# Patient Record
Sex: Male | Born: 1945 | Race: White | Hispanic: No | Smoking: Former smoker
Health system: Southern US, Community
[De-identification: ages and names within clinical notes are randomized; demographics above are authoritative.]

## PROBLEM LIST (undated history)

## (undated) DIAGNOSIS — I739 Peripheral vascular disease, unspecified: Secondary | ICD-10-CM

## (undated) DIAGNOSIS — J42 Unspecified chronic bronchitis: Secondary | ICD-10-CM

## (undated) DIAGNOSIS — I10 Essential (primary) hypertension: Secondary | ICD-10-CM

## (undated) DIAGNOSIS — C801 Malignant (primary) neoplasm, unspecified: Secondary | ICD-10-CM

## (undated) DIAGNOSIS — I240 Acute coronary thrombosis not resulting in myocardial infarction: Secondary | ICD-10-CM

## (undated) HISTORY — PX: MANDIBLE SURGERY: SHX707

## (undated) HISTORY — PX: NECK SURGERY: SHX720

## (undated) HISTORY — PX: KNEE SURGERY: SHX244

---

## 1999-05-11 ENCOUNTER — Encounter: Admission: RE | Admit: 1999-05-11 | Discharge: 1999-08-09 | Payer: Self-pay | Admitting: Anesthesiology

## 2000-01-14 ENCOUNTER — Ambulatory Visit (HOSPITAL_COMMUNITY): Admission: RE | Admit: 2000-01-14 | Discharge: 2000-01-14 | Payer: Self-pay | Admitting: Orthopedic Surgery

## 2000-01-14 ENCOUNTER — Encounter: Payer: Self-pay | Admitting: Orthopedic Surgery

## 2000-01-28 ENCOUNTER — Encounter: Payer: Self-pay | Admitting: Orthopedic Surgery

## 2000-01-28 ENCOUNTER — Ambulatory Visit (HOSPITAL_COMMUNITY): Admission: RE | Admit: 2000-01-28 | Discharge: 2000-01-28 | Payer: Self-pay | Admitting: Orthopedic Surgery

## 2000-02-11 ENCOUNTER — Encounter: Payer: Self-pay | Admitting: Orthopedic Surgery

## 2000-02-11 ENCOUNTER — Ambulatory Visit (HOSPITAL_COMMUNITY): Admission: RE | Admit: 2000-02-11 | Discharge: 2000-02-11 | Payer: Self-pay | Admitting: Orthopedic Surgery

## 2001-09-30 HISTORY — PX: CERVICAL SPINE SURGERY: SHX589

## 2002-01-22 ENCOUNTER — Emergency Department (HOSPITAL_COMMUNITY): Admission: EM | Admit: 2002-01-22 | Discharge: 2002-01-22 | Payer: Self-pay | Admitting: *Deleted

## 2002-01-24 ENCOUNTER — Emergency Department (HOSPITAL_COMMUNITY): Admission: EM | Admit: 2002-01-24 | Discharge: 2002-01-25 | Payer: Self-pay | Admitting: *Deleted

## 2002-02-04 ENCOUNTER — Encounter: Payer: Self-pay | Admitting: Family Medicine

## 2002-02-04 ENCOUNTER — Encounter: Admission: RE | Admit: 2002-02-04 | Discharge: 2002-02-04 | Payer: Self-pay | Admitting: Family Medicine

## 2002-02-11 ENCOUNTER — Encounter: Admission: RE | Admit: 2002-02-11 | Discharge: 2002-05-12 | Payer: Self-pay | Admitting: Family Medicine

## 2002-06-09 ENCOUNTER — Observation Stay (HOSPITAL_COMMUNITY): Admission: RE | Admit: 2002-06-09 | Discharge: 2002-06-10 | Payer: Self-pay | Admitting: Neurosurgery

## 2005-02-15 ENCOUNTER — Emergency Department (HOSPITAL_COMMUNITY): Admission: EM | Admit: 2005-02-15 | Discharge: 2005-02-15 | Payer: Self-pay | Admitting: Emergency Medicine

## 2005-02-19 ENCOUNTER — Ambulatory Visit (HOSPITAL_COMMUNITY): Admission: RE | Admit: 2005-02-19 | Discharge: 2005-02-19 | Payer: Self-pay | Admitting: Orthopedic Surgery

## 2005-02-19 ENCOUNTER — Ambulatory Visit (HOSPITAL_BASED_OUTPATIENT_CLINIC_OR_DEPARTMENT_OTHER): Admission: RE | Admit: 2005-02-19 | Discharge: 2005-02-19 | Payer: Self-pay | Admitting: Orthopedic Surgery

## 2005-02-26 ENCOUNTER — Ambulatory Visit (HOSPITAL_COMMUNITY): Admission: RE | Admit: 2005-02-26 | Discharge: 2005-02-26 | Payer: Self-pay | Admitting: Orthopedic Surgery

## 2005-02-26 ENCOUNTER — Ambulatory Visit (HOSPITAL_BASED_OUTPATIENT_CLINIC_OR_DEPARTMENT_OTHER): Admission: RE | Admit: 2005-02-26 | Discharge: 2005-02-26 | Payer: Self-pay | Admitting: Orthopedic Surgery

## 2005-05-07 ENCOUNTER — Emergency Department (HOSPITAL_COMMUNITY): Admission: EM | Admit: 2005-05-07 | Discharge: 2005-05-08 | Payer: Self-pay | Admitting: *Deleted

## 2006-08-22 ENCOUNTER — Encounter (INDEPENDENT_AMBULATORY_CARE_PROVIDER_SITE_OTHER): Payer: Self-pay | Admitting: Specialist

## 2006-08-22 ENCOUNTER — Ambulatory Visit (HOSPITAL_COMMUNITY): Admission: RE | Admit: 2006-08-22 | Discharge: 2006-08-22 | Payer: Self-pay | Admitting: Surgery

## 2007-12-09 ENCOUNTER — Ambulatory Visit: Payer: Self-pay | Admitting: Cardiology

## 2007-12-15 ENCOUNTER — Ambulatory Visit: Payer: Self-pay

## 2008-05-05 ENCOUNTER — Other Ambulatory Visit: Admission: RE | Admit: 2008-05-05 | Discharge: 2008-05-05 | Payer: Self-pay | Admitting: Otolaryngology

## 2008-05-09 ENCOUNTER — Encounter: Admission: RE | Admit: 2008-05-09 | Discharge: 2008-05-09 | Payer: Self-pay | Admitting: Otolaryngology

## 2008-07-07 ENCOUNTER — Ambulatory Visit: Payer: Self-pay | Admitting: Internal Medicine

## 2008-07-18 ENCOUNTER — Ambulatory Visit (HOSPITAL_COMMUNITY): Admission: RE | Admit: 2008-07-18 | Discharge: 2008-07-18 | Payer: Self-pay | Admitting: Internal Medicine

## 2008-07-21 ENCOUNTER — Ambulatory Visit: Admission: RE | Admit: 2008-07-21 | Discharge: 2008-07-21 | Payer: Self-pay | Admitting: Internal Medicine

## 2008-07-26 LAB — CBC WITH DIFFERENTIAL/PLATELET
BASO%: 0.3 % (ref 0.0–2.0)
Basophils Absolute: 0 10*3/uL (ref 0.0–0.1)
EOS%: 4 % (ref 0.0–7.0)
Eosinophils Absolute: 0.2 10*3/uL (ref 0.0–0.5)
HCT: 38.8 % (ref 38.7–49.9)
HGB: 13.3 g/dL (ref 13.0–17.1)
LYMPH%: 20.6 % (ref 14.0–48.0)
MCH: 27.3 pg — ABNORMAL LOW (ref 28.0–33.4)
MCHC: 34.2 g/dL (ref 32.0–35.9)
MCV: 79.8 fL — ABNORMAL LOW (ref 81.6–98.0)
MONO#: 0.4 10*3/uL (ref 0.1–0.9)
MONO%: 9.4 % (ref 0.0–13.0)
NEUT#: 2.5 10*3/uL (ref 1.5–6.5)
NEUT%: 65.7 % (ref 40.0–75.0)
Platelets: 159 10*3/uL (ref 145–400)
RBC: 4.86 10*6/uL (ref 4.20–5.71)
RDW: 12.9 % (ref 11.2–14.6)
WBC: 3.9 10*3/uL — ABNORMAL LOW (ref 4.0–10.0)
lymph#: 0.8 10*3/uL — ABNORMAL LOW (ref 0.9–3.3)

## 2008-07-26 LAB — COMPREHENSIVE METABOLIC PANEL
ALT: 14 U/L (ref 0–53)
AST: 13 U/L (ref 0–37)
Albumin: 4.5 g/dL (ref 3.5–5.2)
Alkaline Phosphatase: 104 U/L (ref 39–117)
BUN: 12 mg/dL (ref 6–23)
CO2: 29 mEq/L (ref 19–32)
Calcium: 9.5 mg/dL (ref 8.4–10.5)
Chloride: 96 mEq/L (ref 96–112)
Creatinine, Ser: 0.92 mg/dL (ref 0.40–1.50)
Glucose, Bld: 450 mg/dL — ABNORMAL HIGH (ref 70–99)
Potassium: 4.7 mEq/L (ref 3.5–5.3)
Sodium: 133 mEq/L — ABNORMAL LOW (ref 135–145)
Total Bilirubin: 0.5 mg/dL (ref 0.3–1.2)
Total Protein: 6.3 g/dL (ref 6.0–8.3)

## 2008-07-26 LAB — LACTATE DEHYDROGENASE: LDH: 123 U/L (ref 94–250)

## 2008-07-28 ENCOUNTER — Ambulatory Visit: Admission: RE | Admit: 2008-07-28 | Discharge: 2008-07-28 | Payer: Self-pay | Admitting: Internal Medicine

## 2008-08-02 LAB — COMPREHENSIVE METABOLIC PANEL
ALT: 13 U/L (ref 0–53)
AST: 17 U/L (ref 0–37)
Albumin: 4.3 g/dL (ref 3.5–5.2)
Alkaline Phosphatase: 96 U/L (ref 39–117)
BUN: 12 mg/dL (ref 6–23)
CO2: 25 mEq/L (ref 19–32)
Calcium: 9.6 mg/dL (ref 8.4–10.5)
Chloride: 103 mEq/L (ref 96–112)
Creatinine, Ser: 0.8 mg/dL (ref 0.40–1.50)
Glucose, Bld: 188 mg/dL — ABNORMAL HIGH (ref 70–99)
Potassium: 4.2 mEq/L (ref 3.5–5.3)
Sodium: 141 mEq/L (ref 135–145)
Total Bilirubin: 0.4 mg/dL (ref 0.3–1.2)
Total Protein: 6.1 g/dL (ref 6.0–8.3)

## 2008-08-02 LAB — CBC WITH DIFFERENTIAL/PLATELET
BASO%: 0.8 % (ref 0.0–2.0)
Basophils Absolute: 0 10*3/uL (ref 0.0–0.1)
EOS%: 4.9 % (ref 0.0–7.0)
Eosinophils Absolute: 0.2 10*3/uL (ref 0.0–0.5)
HCT: 42.4 % (ref 38.7–49.9)
HGB: 13.9 g/dL (ref 13.0–17.1)
LYMPH%: 24.6 % (ref 14.0–48.0)
MCH: 25.9 pg — ABNORMAL LOW (ref 28.0–33.4)
MCHC: 32.7 g/dL (ref 32.0–35.9)
MCV: 79 fL — ABNORMAL LOW (ref 81.6–98.0)
MONO#: 0.4 10*3/uL (ref 0.1–0.9)
MONO%: 10.9 % (ref 0.0–13.0)
NEUT#: 2.3 10*3/uL (ref 1.5–6.5)
NEUT%: 58.8 % (ref 40.0–75.0)
Platelets: 177 10*3/uL (ref 145–400)
RBC: 5.37 10*6/uL (ref 4.20–5.71)
RDW: 12.5 % (ref 11.2–14.6)
WBC: 3.9 10*3/uL — ABNORMAL LOW (ref 4.0–10.0)
lymph#: 1 10*3/uL (ref 0.9–3.3)

## 2008-08-02 LAB — LACTATE DEHYDROGENASE: LDH: 131 U/L (ref 94–250)

## 2008-08-09 LAB — CBC WITH DIFFERENTIAL/PLATELET
BASO%: 0.4 % (ref 0.0–2.0)
Basophils Absolute: 0 10*3/uL (ref 0.0–0.1)
EOS%: 6.9 % (ref 0.0–7.0)
Eosinophils Absolute: 0.2 10*3/uL (ref 0.0–0.5)
HCT: 36.3 % — ABNORMAL LOW (ref 38.7–49.9)
HGB: 12.4 g/dL — ABNORMAL LOW (ref 13.0–17.1)
LYMPH%: 29.2 % (ref 14.0–48.0)
MCH: 27.4 pg — ABNORMAL LOW (ref 28.0–33.4)
MCHC: 34.2 g/dL (ref 32.0–35.9)
MCV: 79.9 fL — ABNORMAL LOW (ref 81.6–98.0)
MONO#: 0.1 10*3/uL (ref 0.1–0.9)
MONO%: 3 % (ref 0.0–13.0)
NEUT#: 1.5 10*3/uL (ref 1.5–6.5)
NEUT%: 60.5 % (ref 40.0–75.0)
Platelets: 137 10*3/uL — ABNORMAL LOW (ref 145–400)
RBC: 4.54 10*6/uL (ref 4.20–5.71)
RDW: 13.3 % (ref 11.2–14.6)
WBC: 2.6 10*3/uL — ABNORMAL LOW (ref 4.0–10.0)
lymph#: 0.7 10*3/uL — ABNORMAL LOW (ref 0.9–3.3)

## 2008-08-09 LAB — COMPREHENSIVE METABOLIC PANEL
ALT: 28 U/L (ref 0–53)
AST: 19 U/L (ref 0–37)
Albumin: 4.3 g/dL (ref 3.5–5.2)
Alkaline Phosphatase: 84 U/L (ref 39–117)
BUN: 15 mg/dL (ref 6–23)
CO2: 27 mEq/L (ref 19–32)
Calcium: 9.9 mg/dL (ref 8.4–10.5)
Chloride: 103 mEq/L (ref 96–112)
Creatinine, Ser: 0.84 mg/dL (ref 0.40–1.50)
Glucose, Bld: 78 mg/dL (ref 70–99)
Potassium: 4.8 mEq/L (ref 3.5–5.3)
Sodium: 140 mEq/L (ref 135–145)
Total Bilirubin: 0.3 mg/dL (ref 0.3–1.2)
Total Protein: 6.6 g/dL (ref 6.0–8.3)

## 2008-08-09 LAB — LACTATE DEHYDROGENASE: LDH: 116 U/L (ref 94–250)

## 2008-08-16 LAB — CBC WITH DIFFERENTIAL/PLATELET
BASO%: 1.1 % (ref 0.0–2.0)
Basophils Absolute: 0 10*3/uL (ref 0.0–0.1)
EOS%: 6.4 % (ref 0.0–7.0)
Eosinophils Absolute: 0.1 10*3/uL (ref 0.0–0.5)
HCT: 40.6 % (ref 38.7–49.9)
HGB: 13.5 g/dL (ref 13.0–17.1)
LYMPH%: 37.1 % (ref 14.0–48.0)
MCH: 26.2 pg — ABNORMAL LOW (ref 28.0–33.4)
MCHC: 33.1 g/dL (ref 32.0–35.9)
MCV: 79 fL — ABNORMAL LOW (ref 81.6–98.0)
MONO#: 0.2 10*3/uL (ref 0.1–0.9)
MONO%: 12.3 % (ref 0.0–13.0)
NEUT#: 0.8 10*3/uL — ABNORMAL LOW (ref 1.5–6.5)
NEUT%: 43.1 % (ref 40.0–75.0)
Platelets: 194 10*3/uL (ref 145–400)
RBC: 5.14 10*6/uL (ref 4.20–5.71)
RDW: 12.9 % (ref 11.2–14.6)
WBC: 1.8 10*3/uL — ABNORMAL LOW (ref 4.0–10.0)
lymph#: 0.7 10*3/uL — ABNORMAL LOW (ref 0.9–3.3)

## 2008-08-23 ENCOUNTER — Ambulatory Visit: Payer: Self-pay | Admitting: Internal Medicine

## 2008-08-23 LAB — CBC WITH DIFFERENTIAL/PLATELET
BASO%: 1.1 % (ref 0.0–2.0)
Basophils Absolute: 0 10*3/uL (ref 0.0–0.1)
EOS%: 2.7 % (ref 0.0–7.0)
Eosinophils Absolute: 0.1 10*3/uL (ref 0.0–0.5)
HCT: 37.6 % — ABNORMAL LOW (ref 38.7–49.9)
HGB: 12.8 g/dL — ABNORMAL LOW (ref 13.0–17.1)
LYMPH%: 26.1 % (ref 14.0–48.0)
MCH: 26.5 pg — ABNORMAL LOW (ref 28.0–33.4)
MCHC: 34 g/dL (ref 32.0–35.9)
MCV: 78 fL — ABNORMAL LOW (ref 81.6–98.0)
MONO#: 0.4 10*3/uL (ref 0.1–0.9)
MONO%: 17.2 % — ABNORMAL HIGH (ref 0.0–13.0)
NEUT#: 1.3 10*3/uL — ABNORMAL LOW (ref 1.5–6.5)
NEUT%: 52.9 % (ref 40.0–75.0)
Platelets: 183 10*3/uL (ref 145–400)
RBC: 4.82 10*6/uL (ref 4.20–5.71)
RDW: 13 % (ref 11.2–14.6)
WBC: 2.4 10*3/uL — ABNORMAL LOW (ref 4.0–10.0)
lymph#: 0.6 10*3/uL — ABNORMAL LOW (ref 0.9–3.3)

## 2008-08-23 LAB — COMPREHENSIVE METABOLIC PANEL
ALT: 14 U/L (ref 0–53)
AST: 18 U/L (ref 0–37)
Albumin: 4.4 g/dL (ref 3.5–5.2)
Alkaline Phosphatase: 75 U/L (ref 39–117)
BUN: 17 mg/dL (ref 6–23)
CO2: 25 mEq/L (ref 19–32)
Calcium: 10 mg/dL (ref 8.4–10.5)
Chloride: 102 mEq/L (ref 96–112)
Creatinine, Ser: 0.73 mg/dL (ref 0.40–1.50)
Glucose, Bld: 202 mg/dL — ABNORMAL HIGH (ref 70–99)
Potassium: 4.6 mEq/L (ref 3.5–5.3)
Sodium: 137 mEq/L (ref 135–145)
Total Bilirubin: 0.3 mg/dL (ref 0.3–1.2)
Total Protein: 6.1 g/dL (ref 6.0–8.3)

## 2008-08-23 LAB — LACTATE DEHYDROGENASE: LDH: 131 U/L (ref 94–250)

## 2008-08-30 LAB — CBC WITH DIFFERENTIAL/PLATELET
BASO%: 1.4 % (ref 0.0–2.0)
Basophils Absolute: 0.1 10*3/uL (ref 0.0–0.1)
EOS%: 3 % (ref 0.0–7.0)
Eosinophils Absolute: 0.3 10*3/uL (ref 0.0–0.5)
HCT: 37.2 % — ABNORMAL LOW (ref 38.7–49.9)
HGB: 12.4 g/dL — ABNORMAL LOW (ref 13.0–17.1)
LYMPH%: 9.9 % — ABNORMAL LOW (ref 14.0–48.0)
MCH: 26 pg — ABNORMAL LOW (ref 28.0–33.4)
MCHC: 33.4 g/dL (ref 32.0–35.9)
MCV: 77.9 fL — ABNORMAL LOW (ref 81.6–98.0)
MONO#: 0.7 10*3/uL (ref 0.1–0.9)
MONO%: 8.1 % (ref 0.0–13.0)
NEUT#: 7 10*3/uL — ABNORMAL HIGH (ref 1.5–6.5)
NEUT%: 77.6 % — ABNORMAL HIGH (ref 40.0–75.0)
Platelets: 112 10*3/uL — ABNORMAL LOW (ref 145–400)
RBC: 4.78 10*6/uL (ref 4.20–5.71)
RDW: 13.1 % (ref 11.2–14.6)
WBC: 9 10*3/uL (ref 4.0–10.0)
lymph#: 0.9 10*3/uL (ref 0.9–3.3)

## 2008-09-13 LAB — CBC WITH DIFFERENTIAL/PLATELET
BASO%: 0.1 % (ref 0.0–2.0)
Basophils Absolute: 0 10*3/uL (ref 0.0–0.1)
EOS%: 1.9 % (ref 0.0–7.0)
Eosinophils Absolute: 0.2 10*3/uL (ref 0.0–0.5)
HCT: 33.4 % — ABNORMAL LOW (ref 38.7–49.9)
HGB: 11.1 g/dL — ABNORMAL LOW (ref 13.0–17.1)
LYMPH%: 5.3 % — ABNORMAL LOW (ref 14.0–48.0)
MCH: 27 pg — ABNORMAL LOW (ref 28.0–33.4)
MCHC: 33.3 g/dL (ref 32.0–35.9)
MCV: 81.1 fL — ABNORMAL LOW (ref 81.6–98.0)
MONO#: 0.6 10*3/uL (ref 0.1–0.9)
MONO%: 5.4 % (ref 0.0–13.0)
NEUT#: 9.3 10*3/uL — ABNORMAL HIGH (ref 1.5–6.5)
NEUT%: 87.3 % — ABNORMAL HIGH (ref 40.0–75.0)
Platelets: 209 10*3/uL (ref 145–400)
RBC: 4.12 10*6/uL — ABNORMAL LOW (ref 4.20–5.71)
RDW: 14.5 % (ref 11.2–14.6)
WBC: 10.6 10*3/uL — ABNORMAL HIGH (ref 4.0–10.0)
lymph#: 0.6 10*3/uL — ABNORMAL LOW (ref 0.9–3.3)

## 2008-09-13 LAB — COMPREHENSIVE METABOLIC PANEL
ALT: 19 U/L (ref 0–53)
AST: 15 U/L (ref 0–37)
Albumin: 4.2 g/dL (ref 3.5–5.2)
Alkaline Phosphatase: 113 U/L (ref 39–117)
BUN: 13 mg/dL (ref 6–23)
CO2: 29 mEq/L (ref 19–32)
Calcium: 8.9 mg/dL (ref 8.4–10.5)
Chloride: 103 mEq/L (ref 96–112)
Creatinine, Ser: 0.91 mg/dL (ref 0.40–1.50)
Glucose, Bld: 197 mg/dL — ABNORMAL HIGH (ref 70–99)
Potassium: 4.7 mEq/L (ref 3.5–5.3)
Sodium: 142 mEq/L (ref 135–145)
Total Bilirubin: 0.4 mg/dL (ref 0.3–1.2)
Total Protein: 5.9 g/dL — ABNORMAL LOW (ref 6.0–8.3)

## 2008-09-13 LAB — LACTATE DEHYDROGENASE: LDH: 145 U/L (ref 94–250)

## 2008-09-22 ENCOUNTER — Ambulatory Visit (HOSPITAL_COMMUNITY): Admission: RE | Admit: 2008-09-22 | Discharge: 2008-09-22 | Payer: Self-pay | Admitting: Internal Medicine

## 2008-09-29 LAB — COMPREHENSIVE METABOLIC PANEL
ALT: 21 U/L (ref 0–53)
AST: 11 U/L (ref 0–37)
Albumin: 4.2 g/dL (ref 3.5–5.2)
Alkaline Phosphatase: 134 U/L — ABNORMAL HIGH (ref 39–117)
BUN: 13 mg/dL (ref 6–23)
CO2: 28 mEq/L (ref 19–32)
Calcium: 9.2 mg/dL (ref 8.4–10.5)
Chloride: 105 mEq/L (ref 96–112)
Creatinine, Ser: 0.85 mg/dL (ref 0.40–1.50)
Glucose, Bld: 145 mg/dL — ABNORMAL HIGH (ref 70–99)
Potassium: 4.7 mEq/L (ref 3.5–5.3)
Sodium: 141 mEq/L (ref 135–145)
Total Bilirubin: 0.3 mg/dL (ref 0.3–1.2)
Total Protein: 6.3 g/dL (ref 6.0–8.3)

## 2008-09-29 LAB — CBC WITH DIFFERENTIAL/PLATELET
BASO%: 0.7 % (ref 0.0–2.0)
Basophils Absolute: 0.1 10*3/uL (ref 0.0–0.1)
EOS%: 1.4 % (ref 0.0–7.0)
Eosinophils Absolute: 0.2 10*3/uL (ref 0.0–0.5)
HCT: 34.8 % — ABNORMAL LOW (ref 38.7–49.9)
HGB: 11.6 g/dL — ABNORMAL LOW (ref 13.0–17.1)
LYMPH%: 6.2 % — ABNORMAL LOW (ref 14.0–48.0)
MCH: 27 pg — ABNORMAL LOW (ref 28.0–33.4)
MCHC: 33.3 g/dL (ref 32.0–35.9)
MCV: 81.3 fL — ABNORMAL LOW (ref 81.6–98.0)
MONO#: 0.8 10*3/uL (ref 0.1–0.9)
MONO%: 6.3 % (ref 0.0–13.0)
NEUT#: 11.5 10*3/uL — ABNORMAL HIGH (ref 1.5–6.5)
NEUT%: 85.4 % — ABNORMAL HIGH (ref 40.0–75.0)
Platelets: 212 10*3/uL (ref 145–400)
RBC: 4.28 10*6/uL (ref 4.20–5.71)
RDW: 15.5 % — ABNORMAL HIGH (ref 11.2–14.6)
WBC: 13.4 10*3/uL — ABNORMAL HIGH (ref 4.0–10.0)
lymph#: 0.8 10*3/uL — ABNORMAL LOW (ref 0.9–3.3)

## 2008-09-29 LAB — LACTATE DEHYDROGENASE: LDH: 150 U/L (ref 94–250)

## 2008-10-03 ENCOUNTER — Ambulatory Visit: Payer: Self-pay | Admitting: Internal Medicine

## 2008-10-04 LAB — COMPREHENSIVE METABOLIC PANEL
ALT: 12 U/L (ref 0–53)
AST: 13 U/L (ref 0–37)
Albumin: 4.3 g/dL (ref 3.5–5.2)
Alkaline Phosphatase: 128 U/L — ABNORMAL HIGH (ref 39–117)
BUN: 11 mg/dL (ref 6–23)
CO2: 26 mEq/L (ref 19–32)
Calcium: 9 mg/dL (ref 8.4–10.5)
Chloride: 104 mEq/L (ref 96–112)
Creatinine, Ser: 0.73 mg/dL (ref 0.40–1.50)
Glucose, Bld: 155 mg/dL — ABNORMAL HIGH (ref 70–99)
Potassium: 4.5 mEq/L (ref 3.5–5.3)
Sodium: 140 mEq/L (ref 135–145)
Total Bilirubin: 0.5 mg/dL (ref 0.3–1.2)
Total Protein: 6.3 g/dL (ref 6.0–8.3)

## 2008-10-04 LAB — CBC WITH DIFFERENTIAL/PLATELET
BASO%: 3.4 % — ABNORMAL HIGH (ref 0.0–2.0)
Basophils Absolute: 0.4 10*3/uL — ABNORMAL HIGH (ref 0.0–0.1)
EOS%: 2.1 % (ref 0.0–7.0)
Eosinophils Absolute: 0.2 10*3/uL (ref 0.0–0.5)
HCT: 37 % — ABNORMAL LOW (ref 38.7–49.9)
HGB: 12.4 g/dL — ABNORMAL LOW (ref 13.0–17.1)
LYMPH%: 6.2 % — ABNORMAL LOW (ref 14.0–48.0)
MCH: 27.3 pg — ABNORMAL LOW (ref 28.0–33.4)
MCHC: 33.6 g/dL (ref 32.0–35.9)
MCV: 81.1 fL — ABNORMAL LOW (ref 81.6–98.0)
MONO#: 0.3 10*3/uL (ref 0.1–0.9)
MONO%: 3.1 % (ref 0.0–13.0)
NEUT#: 9 10*3/uL — ABNORMAL HIGH (ref 1.5–6.5)
NEUT%: 85.2 % — ABNORMAL HIGH (ref 40.0–75.0)
Platelets: 162 10*3/uL (ref 145–400)
RBC: 4.56 10*6/uL (ref 4.20–5.71)
RDW: 16.6 % — ABNORMAL HIGH (ref 11.2–14.6)
WBC: 10.5 10*3/uL — ABNORMAL HIGH (ref 4.0–10.0)
lymph#: 0.6 10*3/uL — ABNORMAL LOW (ref 0.9–3.3)

## 2008-10-04 LAB — LACTATE DEHYDROGENASE: LDH: 210 U/L (ref 94–250)

## 2008-10-11 LAB — CBC WITH DIFFERENTIAL/PLATELET
BASO%: 0 % (ref 0.0–2.0)
Basophils Absolute: 0 10*3/uL (ref 0.0–0.1)
EOS%: 2.8 % (ref 0.0–7.0)
Eosinophils Absolute: 0.3 10*3/uL (ref 0.0–0.5)
HCT: 32.5 % — ABNORMAL LOW (ref 38.7–49.9)
HGB: 10.9 g/dL — ABNORMAL LOW (ref 13.0–17.1)
LYMPH%: 5 % — ABNORMAL LOW (ref 14.0–48.0)
MCH: 27.2 pg — ABNORMAL LOW (ref 28.0–33.4)
MCHC: 33.5 g/dL (ref 32.0–35.9)
MCV: 81.1 fL — ABNORMAL LOW (ref 81.6–98.0)
MONO#: 0.6 10*3/uL (ref 0.1–0.9)
MONO%: 4.9 % (ref 0.0–13.0)
NEUT#: 9.8 10*3/uL — ABNORMAL HIGH (ref 1.5–6.5)
NEUT%: 87.3 % — ABNORMAL HIGH (ref 40.0–75.0)
Platelets: 142 10*3/uL — ABNORMAL LOW (ref 145–400)
RBC: 4.01 10*6/uL — ABNORMAL LOW (ref 4.20–5.71)
RDW: 16.8 % — ABNORMAL HIGH (ref 11.2–14.6)
WBC: 11.2 10*3/uL — ABNORMAL HIGH (ref 4.0–10.0)
lymph#: 0.6 10*3/uL — ABNORMAL LOW (ref 0.9–3.3)

## 2008-10-25 LAB — CBC WITH DIFFERENTIAL/PLATELET
BASO%: 0 % (ref 0.0–2.0)
Basophils Absolute: 0 10*3/uL (ref 0.0–0.1)
EOS%: 2.4 % (ref 0.0–7.0)
Eosinophils Absolute: 0.3 10*3/uL (ref 0.0–0.5)
HCT: 34.5 % — ABNORMAL LOW (ref 38.7–49.9)
HGB: 11.4 g/dL — ABNORMAL LOW (ref 13.0–17.1)
LYMPH%: 5.6 % — ABNORMAL LOW (ref 14.0–48.0)
MCH: 27.1 pg — ABNORMAL LOW (ref 28.0–33.4)
MCHC: 33.2 g/dL (ref 32.0–35.9)
MCV: 81.6 fL (ref 81.6–98.0)
MONO#: 0.4 10*3/uL (ref 0.1–0.9)
MONO%: 3.9 % (ref 0.0–13.0)
NEUT#: 9.7 10*3/uL — ABNORMAL HIGH (ref 1.5–6.5)
NEUT%: 88.1 % — ABNORMAL HIGH (ref 40.0–75.0)
Platelets: 155 10*3/uL (ref 145–400)
RBC: 4.22 10*6/uL (ref 4.20–5.71)
RDW: 17.2 % — ABNORMAL HIGH (ref 11.2–14.6)
WBC: 11 10*3/uL — ABNORMAL HIGH (ref 4.0–10.0)
lymph#: 0.6 10*3/uL — ABNORMAL LOW (ref 0.9–3.3)

## 2008-11-01 LAB — COMPREHENSIVE METABOLIC PANEL
ALT: 10 U/L (ref 0–53)
AST: 9 U/L (ref 0–37)
Albumin: 4.5 g/dL (ref 3.5–5.2)
Alkaline Phosphatase: 144 U/L — ABNORMAL HIGH (ref 39–117)
BUN: 15 mg/dL (ref 6–23)
CO2: 25 mEq/L (ref 19–32)
Calcium: 9.5 mg/dL (ref 8.4–10.5)
Chloride: 98 mEq/L (ref 96–112)
Creatinine, Ser: 0.69 mg/dL (ref 0.40–1.50)
Glucose, Bld: 232 mg/dL — ABNORMAL HIGH (ref 70–99)
Potassium: 4.6 mEq/L (ref 3.5–5.3)
Sodium: 136 mEq/L (ref 135–145)
Total Bilirubin: 0.5 mg/dL (ref 0.3–1.2)
Total Protein: 6.6 g/dL (ref 6.0–8.3)

## 2008-11-01 LAB — CBC WITH DIFFERENTIAL/PLATELET
BASO%: 0.1 % (ref 0.0–2.0)
Basophils Absolute: 0 10*3/uL (ref 0.0–0.1)
EOS%: 2 % (ref 0.0–7.0)
Eosinophils Absolute: 0.3 10*3/uL (ref 0.0–0.5)
HCT: 36.8 % — ABNORMAL LOW (ref 38.7–49.9)
HGB: 12.2 g/dL — ABNORMAL LOW (ref 13.0–17.1)
LYMPH%: 4.3 % — ABNORMAL LOW (ref 14.0–48.0)
MCH: 27 pg — ABNORMAL LOW (ref 28.0–33.4)
MCHC: 33.3 g/dL (ref 32.0–35.9)
MCV: 81.2 fL — ABNORMAL LOW (ref 81.6–98.0)
MONO#: 0.1 10*3/uL (ref 0.1–0.9)
MONO%: 1 % (ref 0.0–13.0)
NEUT#: 12.4 10*3/uL — ABNORMAL HIGH (ref 1.5–6.5)
NEUT%: 92.6 % — ABNORMAL HIGH (ref 40.0–75.0)
Platelets: 169 10*3/uL (ref 145–400)
RBC: 4.53 10*6/uL (ref 4.20–5.71)
RDW: 16.9 % — ABNORMAL HIGH (ref 11.2–14.6)
WBC: 13.4 10*3/uL — ABNORMAL HIGH (ref 4.0–10.0)
lymph#: 0.6 10*3/uL — ABNORMAL LOW (ref 0.9–3.3)

## 2008-11-01 LAB — LACTATE DEHYDROGENASE: LDH: 137 U/L (ref 94–250)

## 2008-11-08 LAB — CBC WITH DIFFERENTIAL/PLATELET
BASO%: 0.2 % (ref 0.0–2.0)
Basophils Absolute: 0 10*3/uL (ref 0.0–0.1)
EOS%: 3.3 % (ref 0.0–7.0)
Eosinophils Absolute: 0.3 10*3/uL (ref 0.0–0.5)
HCT: 35.6 % — ABNORMAL LOW (ref 38.7–49.9)
HGB: 11.9 g/dL — ABNORMAL LOW (ref 13.0–17.1)
LYMPH%: 6.1 % — ABNORMAL LOW (ref 14.0–48.0)
MCH: 27.2 pg — ABNORMAL LOW (ref 28.0–33.4)
MCHC: 33.3 g/dL (ref 32.0–35.9)
MCV: 81.7 fL (ref 81.6–98.0)
MONO#: 0.5 10*3/uL (ref 0.1–0.9)
MONO%: 5.5 % (ref 0.0–13.0)
NEUT#: 8.2 10*3/uL — ABNORMAL HIGH (ref 1.5–6.5)
NEUT%: 84.9 % — ABNORMAL HIGH (ref 40.0–75.0)
Platelets: 161 10*3/uL (ref 145–400)
RBC: 4.36 10*6/uL (ref 4.20–5.71)
RDW: 16.3 % — ABNORMAL HIGH (ref 11.2–14.6)
WBC: 9.7 10*3/uL (ref 4.0–10.0)
lymph#: 0.6 10*3/uL — ABNORMAL LOW (ref 0.9–3.3)

## 2008-11-15 LAB — COMPREHENSIVE METABOLIC PANEL
ALT: 12 U/L (ref 0–53)
AST: 11 U/L (ref 0–37)
Albumin: 4.8 g/dL (ref 3.5–5.2)
Alkaline Phosphatase: 164 U/L — ABNORMAL HIGH (ref 39–117)
BUN: 18 mg/dL (ref 6–23)
CO2: 23 mEq/L (ref 19–32)
Calcium: 9.8 mg/dL (ref 8.4–10.5)
Chloride: 99 mEq/L (ref 96–112)
Creatinine, Ser: 0.86 mg/dL (ref 0.40–1.50)
Glucose, Bld: 356 mg/dL — ABNORMAL HIGH (ref 70–99)
Potassium: 4.8 mEq/L (ref 3.5–5.3)
Sodium: 136 mEq/L (ref 135–145)
Total Bilirubin: 0.4 mg/dL (ref 0.3–1.2)
Total Protein: 7 g/dL (ref 6.0–8.3)

## 2008-11-15 LAB — CBC WITH DIFFERENTIAL/PLATELET
BASO%: 0.3 % (ref 0.0–2.0)
Basophils Absolute: 0 10*3/uL (ref 0.0–0.1)
EOS%: 2.9 % (ref 0.0–7.0)
Eosinophils Absolute: 0.3 10*3/uL (ref 0.0–0.5)
HCT: 37.8 % — ABNORMAL LOW (ref 38.7–49.9)
HGB: 12.6 g/dL — ABNORMAL LOW (ref 13.0–17.1)
LYMPH%: 6.1 % — ABNORMAL LOW (ref 14.0–48.0)
MCH: 27.2 pg — ABNORMAL LOW (ref 28.0–33.4)
MCHC: 33.4 g/dL (ref 32.0–35.9)
MCV: 81.4 fL — ABNORMAL LOW (ref 81.6–98.0)
MONO#: 0.6 10*3/uL (ref 0.1–0.9)
MONO%: 5.4 % (ref 0.0–13.0)
NEUT#: 9.4 10*3/uL — ABNORMAL HIGH (ref 1.5–6.5)
NEUT%: 85.3 % — ABNORMAL HIGH (ref 40.0–75.0)
Platelets: 185 10*3/uL (ref 145–400)
RBC: 4.65 10*6/uL (ref 4.20–5.71)
RDW: 16.3 % — ABNORMAL HIGH (ref 11.2–14.6)
WBC: 11 10*3/uL — ABNORMAL HIGH (ref 4.0–10.0)
lymph#: 0.7 10*3/uL — ABNORMAL LOW (ref 0.9–3.3)

## 2008-11-15 LAB — LACTATE DEHYDROGENASE: LDH: 157 U/L (ref 94–250)

## 2008-11-18 ENCOUNTER — Ambulatory Visit (HOSPITAL_COMMUNITY): Admission: RE | Admit: 2008-11-18 | Discharge: 2008-11-18 | Payer: Self-pay | Admitting: Internal Medicine

## 2008-11-18 ENCOUNTER — Ambulatory Visit: Payer: Self-pay | Admitting: Internal Medicine

## 2008-11-29 LAB — CBC WITH DIFFERENTIAL/PLATELET
BASO%: 0.2 % (ref 0.0–2.0)
Basophils Absolute: 0 10*3/uL (ref 0.0–0.1)
EOS%: 3.2 % (ref 0.0–7.0)
Eosinophils Absolute: 0.4 10*3/uL (ref 0.0–0.5)
HCT: 36 % — ABNORMAL LOW (ref 38.4–49.9)
HGB: 11.7 g/dL — ABNORMAL LOW (ref 13.0–17.1)
LYMPH%: 3.7 % — ABNORMAL LOW (ref 14.0–49.0)
MCH: 26.1 pg — ABNORMAL LOW (ref 27.2–33.4)
MCHC: 32.5 g/dL (ref 32.0–36.0)
MCV: 80.4 fL (ref 79.3–98.0)
MONO#: 0.9 10*3/uL (ref 0.1–0.9)
MONO%: 6.3 % (ref 0.0–14.0)
NEUT#: 12 10*3/uL — ABNORMAL HIGH (ref 1.5–6.5)
NEUT%: 86.6 % — ABNORMAL HIGH (ref 39.0–75.0)
Platelets: 194 10*3/uL (ref 140–400)
RBC: 4.48 10*6/uL (ref 4.20–5.82)
RDW: 14.6 % (ref 11.0–14.6)
WBC: 13.9 10*3/uL — ABNORMAL HIGH (ref 4.0–10.3)
lymph#: 0.5 10*3/uL — ABNORMAL LOW (ref 0.9–3.3)
nRBC: 0 % (ref 0–0)

## 2008-11-29 LAB — COMPREHENSIVE METABOLIC PANEL
ALT: 10 U/L (ref 0–53)
AST: 10 U/L (ref 0–37)
Albumin: 4.4 g/dL (ref 3.5–5.2)
Alkaline Phosphatase: 161 U/L — ABNORMAL HIGH (ref 39–117)
BUN: 11 mg/dL (ref 6–23)
CO2: 24 mEq/L (ref 19–32)
Calcium: 9.1 mg/dL (ref 8.4–10.5)
Chloride: 102 mEq/L (ref 96–112)
Creatinine, Ser: 0.76 mg/dL (ref 0.40–1.50)
Glucose, Bld: 231 mg/dL — ABNORMAL HIGH (ref 70–99)
Potassium: 4.3 mEq/L (ref 3.5–5.3)
Sodium: 140 mEq/L (ref 135–145)
Total Bilirubin: 0.4 mg/dL (ref 0.3–1.2)
Total Protein: 6.4 g/dL (ref 6.0–8.3)

## 2008-12-06 LAB — CBC WITH DIFFERENTIAL/PLATELET
BASO%: 0.4 % (ref 0.0–2.0)
Basophils Absolute: 0 10*3/uL (ref 0.0–0.1)
EOS%: 4.2 % (ref 0.0–7.0)
Eosinophils Absolute: 0.3 10*3/uL (ref 0.0–0.5)
HCT: 33.5 % — ABNORMAL LOW (ref 38.4–49.9)
HGB: 11.3 g/dL — ABNORMAL LOW (ref 13.0–17.1)
LYMPH%: 4.4 % — ABNORMAL LOW (ref 14.0–49.0)
MCH: 27.2 pg (ref 27.2–33.4)
MCHC: 33.6 g/dL (ref 32.0–36.0)
MCV: 80.9 fL (ref 79.3–98.0)
MONO#: 0.4 10*3/uL (ref 0.1–0.9)
MONO%: 6.1 % (ref 0.0–14.0)
NEUT#: 5.1 10*3/uL (ref 1.5–6.5)
NEUT%: 84.9 % — ABNORMAL HIGH (ref 39.0–75.0)
Platelets: 203 10*3/uL (ref 140–400)
RBC: 4.14 10*6/uL — ABNORMAL LOW (ref 4.20–5.82)
RDW: 14.4 % (ref 11.0–14.6)
WBC: 6.1 10*3/uL (ref 4.0–10.3)
lymph#: 0.3 10*3/uL — ABNORMAL LOW (ref 0.9–3.3)

## 2008-12-06 LAB — COMPREHENSIVE METABOLIC PANEL
ALT: 11 U/L (ref 0–53)
AST: 9 U/L (ref 0–37)
Albumin: 4.3 g/dL (ref 3.5–5.2)
Alkaline Phosphatase: 173 U/L — ABNORMAL HIGH (ref 39–117)
BUN: 12 mg/dL (ref 6–23)
CO2: 28 mEq/L (ref 19–32)
Calcium: 9.2 mg/dL (ref 8.4–10.5)
Chloride: 101 mEq/L (ref 96–112)
Creatinine, Ser: 0.72 mg/dL (ref 0.40–1.50)
Glucose, Bld: 236 mg/dL — ABNORMAL HIGH (ref 70–99)
Potassium: 4.5 mEq/L (ref 3.5–5.3)
Sodium: 139 mEq/L (ref 135–145)
Total Bilirubin: 0.5 mg/dL (ref 0.3–1.2)
Total Protein: 6.2 g/dL (ref 6.0–8.3)

## 2008-12-13 LAB — CBC WITH DIFFERENTIAL/PLATELET
BASO%: 0.3 % (ref 0.0–2.0)
Basophils Absolute: 0 10*3/uL (ref 0.0–0.1)
EOS%: 3.2 % (ref 0.0–7.0)
Eosinophils Absolute: 0.4 10*3/uL (ref 0.0–0.5)
HCT: 32.8 % — ABNORMAL LOW (ref 38.4–49.9)
HGB: 10.6 g/dL — ABNORMAL LOW (ref 13.0–17.1)
LYMPH%: 3.1 % — ABNORMAL LOW (ref 14.0–49.0)
MCH: 25.6 pg — ABNORMAL LOW (ref 27.2–33.4)
MCHC: 32.3 g/dL (ref 32.0–36.0)
MCV: 79.2 fL — ABNORMAL LOW (ref 79.3–98.0)
MONO#: 1 10*3/uL — ABNORMAL HIGH (ref 0.1–0.9)
MONO%: 9 % (ref 0.0–14.0)
NEUT#: 9.7 10*3/uL — ABNORMAL HIGH (ref 1.5–6.5)
NEUT%: 84.4 % — ABNORMAL HIGH (ref 39.0–75.0)
Platelets: 209 10*3/uL (ref 140–400)
RBC: 4.14 10*6/uL — ABNORMAL LOW (ref 4.20–5.82)
RDW: 14.2 % (ref 11.0–14.6)
WBC: 11.5 10*3/uL — ABNORMAL HIGH (ref 4.0–10.3)
lymph#: 0.4 10*3/uL — ABNORMAL LOW (ref 0.9–3.3)
nRBC: 0 % (ref 0–0)

## 2008-12-13 LAB — COMPREHENSIVE METABOLIC PANEL
ALT: 10 U/L (ref 0–53)
AST: 13 U/L (ref 0–37)
Albumin: 4.1 g/dL (ref 3.5–5.2)
Alkaline Phosphatase: 126 U/L — ABNORMAL HIGH (ref 39–117)
BUN: 12 mg/dL (ref 6–23)
CO2: 27 mEq/L (ref 19–32)
Calcium: 9.3 mg/dL (ref 8.4–10.5)
Chloride: 98 mEq/L (ref 96–112)
Creatinine, Ser: 0.79 mg/dL (ref 0.40–1.50)
Glucose, Bld: 185 mg/dL — ABNORMAL HIGH (ref 70–99)
Potassium: 4.3 mEq/L (ref 3.5–5.3)
Sodium: 136 mEq/L (ref 135–145)
Total Bilirubin: 0.4 mg/dL (ref 0.3–1.2)
Total Protein: 6.6 g/dL (ref 6.0–8.3)

## 2008-12-13 LAB — LACTATE DEHYDROGENASE: LDH: 156 U/L (ref 94–250)

## 2008-12-20 LAB — CBC WITH DIFFERENTIAL/PLATELET
BASO%: 0.3 % (ref 0.0–2.0)
Basophils Absolute: 0 10*3/uL (ref 0.0–0.1)
EOS%: 3.2 % (ref 0.0–7.0)
Eosinophils Absolute: 0.3 10*3/uL (ref 0.0–0.5)
HCT: 32.5 % — ABNORMAL LOW (ref 38.4–49.9)
HGB: 10.4 g/dL — ABNORMAL LOW (ref 13.0–17.1)
LYMPH%: 3.1 % — ABNORMAL LOW (ref 14.0–49.0)
MCH: 25.5 pg — ABNORMAL LOW (ref 27.2–33.4)
MCHC: 32 g/dL (ref 32.0–36.0)
MCV: 79.7 fL (ref 79.3–98.0)
MONO#: 0.7 10*3/uL (ref 0.1–0.9)
MONO%: 6.4 % (ref 0.0–14.0)
NEUT#: 9 10*3/uL — ABNORMAL HIGH (ref 1.5–6.5)
NEUT%: 87 % — ABNORMAL HIGH (ref 39.0–75.0)
Platelets: 266 10*3/uL (ref 140–400)
RBC: 4.08 10*6/uL — ABNORMAL LOW (ref 4.20–5.82)
RDW: 14 % (ref 11.0–14.6)
WBC: 10.4 10*3/uL — ABNORMAL HIGH (ref 4.0–10.3)
lymph#: 0.3 10*3/uL — ABNORMAL LOW (ref 0.9–3.3)

## 2008-12-20 LAB — COMPREHENSIVE METABOLIC PANEL
ALT: 14 U/L (ref 0–53)
AST: 11 U/L (ref 0–37)
Albumin: 4.1 g/dL (ref 3.5–5.2)
Alkaline Phosphatase: 150 U/L — ABNORMAL HIGH (ref 39–117)
BUN: 12 mg/dL (ref 6–23)
CO2: 27 mEq/L (ref 19–32)
Calcium: 9.3 mg/dL (ref 8.4–10.5)
Chloride: 99 mEq/L (ref 96–112)
Creatinine, Ser: 0.84 mg/dL (ref 0.40–1.50)
Glucose, Bld: 164 mg/dL — ABNORMAL HIGH (ref 70–99)
Potassium: 4.4 mEq/L (ref 3.5–5.3)
Sodium: 138 mEq/L (ref 135–145)
Total Bilirubin: 0.4 mg/dL (ref 0.3–1.2)
Total Protein: 6.1 g/dL (ref 6.0–8.3)

## 2008-12-27 LAB — COMPREHENSIVE METABOLIC PANEL
ALT: 8 U/L (ref 0–53)
AST: 10 U/L (ref 0–37)
Albumin: 4.2 g/dL (ref 3.5–5.2)
Alkaline Phosphatase: 143 U/L — ABNORMAL HIGH (ref 39–117)
BUN: 14 mg/dL (ref 6–23)
CO2: 24 mEq/L (ref 19–32)
Calcium: 9.2 mg/dL (ref 8.4–10.5)
Chloride: 99 mEq/L (ref 96–112)
Creatinine, Ser: 0.74 mg/dL (ref 0.40–1.50)
Glucose, Bld: 197 mg/dL — ABNORMAL HIGH (ref 70–99)
Potassium: 4.5 mEq/L (ref 3.5–5.3)
Sodium: 134 mEq/L — ABNORMAL LOW (ref 135–145)
Total Bilirubin: 0.4 mg/dL (ref 0.3–1.2)
Total Protein: 6.6 g/dL (ref 6.0–8.3)

## 2008-12-27 LAB — CBC WITH DIFFERENTIAL/PLATELET
BASO%: 0.3 % (ref 0.0–2.0)
Basophils Absolute: 0 10*3/uL (ref 0.0–0.1)
EOS%: 2.5 % (ref 0.0–7.0)
Eosinophils Absolute: 0.3 10*3/uL (ref 0.0–0.5)
HCT: 34.7 % — ABNORMAL LOW (ref 38.4–49.9)
HGB: 11 g/dL — ABNORMAL LOW (ref 13.0–17.1)
LYMPH%: 6.1 % — ABNORMAL LOW (ref 14.0–49.0)
MCH: 24.9 pg — ABNORMAL LOW (ref 27.2–33.4)
MCHC: 31.7 g/dL — ABNORMAL LOW (ref 32.0–36.0)
MCV: 78.7 fL — ABNORMAL LOW (ref 79.3–98.0)
MONO#: 1.3 10*3/uL — ABNORMAL HIGH (ref 0.1–0.9)
MONO%: 9.8 % (ref 0.0–14.0)
NEUT#: 10.6 10*3/uL — ABNORMAL HIGH (ref 1.5–6.5)
NEUT%: 81.3 % — ABNORMAL HIGH (ref 39.0–75.0)
Platelets: 264 10*3/uL (ref 140–400)
RBC: 4.41 10*6/uL (ref 4.20–5.82)
RDW: 14.5 % (ref 11.0–14.6)
WBC: 13 10*3/uL — ABNORMAL HIGH (ref 4.0–10.3)
lymph#: 0.8 10*3/uL — ABNORMAL LOW (ref 0.9–3.3)
nRBC: 0 % (ref 0–0)

## 2009-01-04 ENCOUNTER — Ambulatory Visit: Payer: Self-pay | Admitting: Internal Medicine

## 2009-01-04 LAB — COMPREHENSIVE METABOLIC PANEL
ALT: 11 U/L (ref 0–53)
AST: 12 U/L (ref 0–37)
Albumin: 4.1 g/dL (ref 3.5–5.2)
Alkaline Phosphatase: 140 U/L — ABNORMAL HIGH (ref 39–117)
BUN: 31 mg/dL — ABNORMAL HIGH (ref 6–23)
CO2: 25 mEq/L (ref 19–32)
Calcium: 10.1 mg/dL (ref 8.4–10.5)
Chloride: 102 mEq/L (ref 96–112)
Creatinine, Ser: 1.09 mg/dL (ref 0.40–1.50)
Glucose, Bld: 170 mg/dL — ABNORMAL HIGH (ref 70–99)
Potassium: 4.6 mEq/L (ref 3.5–5.3)
Sodium: 138 mEq/L (ref 135–145)
Total Bilirubin: 0.3 mg/dL (ref 0.3–1.2)
Total Protein: 6.2 g/dL (ref 6.0–8.3)

## 2009-01-04 LAB — CBC WITH DIFFERENTIAL/PLATELET
BASO%: 0.5 % (ref 0.0–2.0)
Basophils Absolute: 0 10*3/uL (ref 0.0–0.1)
EOS%: 2 % (ref 0.0–7.0)
Eosinophils Absolute: 0.2 10*3/uL (ref 0.0–0.5)
HCT: 30.6 % — ABNORMAL LOW (ref 38.4–49.9)
HGB: 10.2 g/dL — ABNORMAL LOW (ref 13.0–17.1)
LYMPH%: 4.2 % — ABNORMAL LOW (ref 14.0–49.0)
MCH: 26 pg — ABNORMAL LOW (ref 27.2–33.4)
MCHC: 33.3 g/dL (ref 32.0–36.0)
MCV: 78.3 fL — ABNORMAL LOW (ref 79.3–98.0)
MONO#: 0.5 10*3/uL (ref 0.1–0.9)
MONO%: 5 % (ref 0.0–14.0)
NEUT#: 8.5 10*3/uL — ABNORMAL HIGH (ref 1.5–6.5)
NEUT%: 88.3 % — ABNORMAL HIGH (ref 39.0–75.0)
Platelets: 209 10*3/uL (ref 140–400)
RBC: 3.91 10*6/uL — ABNORMAL LOW (ref 4.20–5.82)
RDW: 14.7 % — ABNORMAL HIGH (ref 11.0–14.6)
WBC: 9.6 10*3/uL (ref 4.0–10.3)
lymph#: 0.4 10*3/uL — ABNORMAL LOW (ref 0.9–3.3)

## 2009-01-04 LAB — LACTATE DEHYDROGENASE: LDH: 185 U/L (ref 94–250)

## 2009-01-10 LAB — CBC WITH DIFFERENTIAL/PLATELET
BASO%: 0.1 % (ref 0.0–2.0)
Basophils Absolute: 0 10*3/uL (ref 0.0–0.1)
EOS%: 2.7 % (ref 0.0–7.0)
Eosinophils Absolute: 0.3 10*3/uL (ref 0.0–0.5)
HCT: 31.8 % — ABNORMAL LOW (ref 38.4–49.9)
HGB: 10.1 g/dL — ABNORMAL LOW (ref 13.0–17.1)
LYMPH%: 4.5 % — ABNORMAL LOW (ref 14.0–49.0)
MCH: 25.1 pg — ABNORMAL LOW (ref 27.2–33.4)
MCHC: 31.8 g/dL — ABNORMAL LOW (ref 32.0–36.0)
MCV: 79.1 fL — ABNORMAL LOW (ref 79.3–98.0)
MONO#: 0.9 10*3/uL (ref 0.1–0.9)
MONO%: 10.1 % (ref 0.0–14.0)
NEUT#: 7.7 10*3/uL — ABNORMAL HIGH (ref 1.5–6.5)
NEUT%: 82.6 % — ABNORMAL HIGH (ref 39.0–75.0)
Platelets: 169 10*3/uL (ref 140–400)
RBC: 4.02 10*6/uL — ABNORMAL LOW (ref 4.20–5.82)
RDW: 15.4 % — ABNORMAL HIGH (ref 11.0–14.6)
WBC: 9.3 10*3/uL (ref 4.0–10.3)
lymph#: 0.4 10*3/uL — ABNORMAL LOW (ref 0.9–3.3)

## 2009-01-10 LAB — COMPREHENSIVE METABOLIC PANEL
ALT: 10 U/L (ref 0–53)
AST: 15 U/L (ref 0–37)
Albumin: 3.9 g/dL (ref 3.5–5.2)
Alkaline Phosphatase: 118 U/L — ABNORMAL HIGH (ref 39–117)
BUN: 16 mg/dL (ref 6–23)
CO2: 26 mEq/L (ref 19–32)
Calcium: 8.8 mg/dL (ref 8.4–10.5)
Chloride: 102 mEq/L (ref 96–112)
Creatinine, Ser: 0.87 mg/dL (ref 0.40–1.50)
Glucose, Bld: 290 mg/dL — ABNORMAL HIGH (ref 70–99)
Potassium: 3.9 mEq/L (ref 3.5–5.3)
Sodium: 137 mEq/L (ref 135–145)
Total Bilirubin: 0.3 mg/dL (ref 0.3–1.2)
Total Protein: 5.8 g/dL — ABNORMAL LOW (ref 6.0–8.3)

## 2009-01-20 ENCOUNTER — Ambulatory Visit (HOSPITAL_COMMUNITY): Admission: RE | Admit: 2009-01-20 | Discharge: 2009-01-20 | Payer: Self-pay | Admitting: Internal Medicine

## 2009-01-25 LAB — COMPREHENSIVE METABOLIC PANEL
ALT: 13 U/L (ref 0–53)
AST: 14 U/L (ref 0–37)
Albumin: 4.3 g/dL (ref 3.5–5.2)
Alkaline Phosphatase: 88 U/L (ref 39–117)
BUN: 9 mg/dL (ref 6–23)
CO2: 33 mEq/L — ABNORMAL HIGH (ref 19–32)
Calcium: 9.3 mg/dL (ref 8.4–10.5)
Chloride: 100 mEq/L (ref 96–112)
Creatinine, Ser: 0.84 mg/dL (ref 0.40–1.50)
Glucose, Bld: 197 mg/dL — ABNORMAL HIGH (ref 70–99)
Potassium: 4.6 mEq/L (ref 3.5–5.3)
Sodium: 139 mEq/L (ref 135–145)
Total Bilirubin: 0.4 mg/dL (ref 0.3–1.2)
Total Protein: 6 g/dL (ref 6.0–8.3)

## 2009-01-25 LAB — CBC WITH DIFFERENTIAL/PLATELET
BASO%: 0.4 % (ref 0.0–2.0)
Basophils Absolute: 0 10*3/uL (ref 0.0–0.1)
EOS%: 9.4 % — ABNORMAL HIGH (ref 0.0–7.0)
Eosinophils Absolute: 0.3 10*3/uL (ref 0.0–0.5)
HCT: 34 % — ABNORMAL LOW (ref 38.4–49.9)
HGB: 10.8 g/dL — ABNORMAL LOW (ref 13.0–17.1)
LYMPH%: 21.9 % (ref 14.0–49.0)
MCH: 24.9 pg — ABNORMAL LOW (ref 27.2–33.4)
MCHC: 31.8 g/dL — ABNORMAL LOW (ref 32.0–36.0)
MCV: 78.5 fL — ABNORMAL LOW (ref 79.3–98.0)
MONO#: 0.5 10*3/uL (ref 0.1–0.9)
MONO%: 20.4 % — ABNORMAL HIGH (ref 0.0–14.0)
NEUT#: 1.3 10*3/uL — ABNORMAL LOW (ref 1.5–6.5)
NEUT%: 47.9 % (ref 39.0–75.0)
Platelets: 198 10*3/uL (ref 140–400)
RBC: 4.33 10*6/uL (ref 4.20–5.82)
RDW: 15.8 % — ABNORMAL HIGH (ref 11.0–14.6)
WBC: 2.7 10*3/uL — ABNORMAL LOW (ref 4.0–10.3)
lymph#: 0.6 10*3/uL — ABNORMAL LOW (ref 0.9–3.3)

## 2009-01-25 LAB — LACTATE DEHYDROGENASE: LDH: 151 U/L (ref 94–250)

## 2009-04-20 ENCOUNTER — Ambulatory Visit: Payer: Self-pay | Admitting: Internal Medicine

## 2009-04-24 ENCOUNTER — Ambulatory Visit (HOSPITAL_COMMUNITY): Admission: RE | Admit: 2009-04-24 | Discharge: 2009-04-24 | Payer: Self-pay | Admitting: Internal Medicine

## 2009-04-24 LAB — CBC WITH DIFFERENTIAL/PLATELET
BASO%: 0 % (ref 0.0–2.0)
Basophils Absolute: 0 10*3/uL (ref 0.0–0.1)
EOS%: 9.5 % — ABNORMAL HIGH (ref 0.0–7.0)
Eosinophils Absolute: 0.2 10*3/uL (ref 0.0–0.5)
HCT: 36.2 % — ABNORMAL LOW (ref 38.4–49.9)
HGB: 11.9 g/dL — ABNORMAL LOW (ref 13.0–17.1)
LYMPH%: 23 % (ref 14.0–49.0)
MCH: 25.9 pg — ABNORMAL LOW (ref 27.2–33.4)
MCHC: 32.9 g/dL (ref 32.0–36.0)
MCV: 78.7 fL — ABNORMAL LOW (ref 79.3–98.0)
MONO#: 0.3 10*3/uL (ref 0.1–0.9)
MONO%: 10.7 % (ref 0.0–14.0)
NEUT#: 1.4 10*3/uL — ABNORMAL LOW (ref 1.5–6.5)
NEUT%: 56.8 % (ref 39.0–75.0)
Platelets: 107 10*3/uL — ABNORMAL LOW (ref 140–400)
RBC: 4.6 10*6/uL (ref 4.20–5.82)
RDW: 15.4 % — ABNORMAL HIGH (ref 11.0–14.6)
WBC: 2.4 10*3/uL — ABNORMAL LOW (ref 4.0–10.3)
lymph#: 0.6 10*3/uL — ABNORMAL LOW (ref 0.9–3.3)

## 2009-04-24 LAB — COMPREHENSIVE METABOLIC PANEL
ALT: 16 U/L (ref 0–53)
AST: 17 U/L (ref 0–37)
Albumin: 4 g/dL (ref 3.5–5.2)
Alkaline Phosphatase: 47 U/L (ref 39–117)
BUN: 14 mg/dL (ref 6–23)
CO2: 29 mEq/L (ref 19–32)
Calcium: 9.2 mg/dL (ref 8.4–10.5)
Chloride: 104 mEq/L (ref 96–112)
Creatinine, Ser: 0.77 mg/dL (ref 0.40–1.50)
Glucose, Bld: 130 mg/dL — ABNORMAL HIGH (ref 70–99)
Potassium: 4 mEq/L (ref 3.5–5.3)
Sodium: 138 mEq/L (ref 135–145)
Total Bilirubin: 0.6 mg/dL (ref 0.3–1.2)
Total Protein: 6.3 g/dL (ref 6.0–8.3)

## 2009-04-24 LAB — LACTATE DEHYDROGENASE: LDH: 118 U/L (ref 94–250)

## 2009-05-24 ENCOUNTER — Emergency Department (HOSPITAL_COMMUNITY): Admission: EM | Admit: 2009-05-24 | Discharge: 2009-05-24 | Payer: Self-pay | Admitting: Emergency Medicine

## 2009-07-20 ENCOUNTER — Ambulatory Visit: Payer: Self-pay | Admitting: Internal Medicine

## 2009-07-24 ENCOUNTER — Ambulatory Visit (HOSPITAL_COMMUNITY): Admission: RE | Admit: 2009-07-24 | Discharge: 2009-07-24 | Payer: Self-pay | Admitting: Internal Medicine

## 2009-07-31 LAB — CBC WITH DIFFERENTIAL/PLATELET
BASO%: 0.3 % (ref 0.0–2.0)
Basophils Absolute: 0 10*3/uL (ref 0.0–0.1)
EOS%: 3.5 % (ref 0.0–7.0)
Eosinophils Absolute: 0.1 10*3/uL (ref 0.0–0.5)
HCT: 38.6 % (ref 38.4–49.9)
HGB: 12.6 g/dL — ABNORMAL LOW (ref 13.0–17.1)
LYMPH%: 23.8 % (ref 14.0–49.0)
MCH: 26.6 pg — ABNORMAL LOW (ref 27.2–33.4)
MCHC: 32.6 g/dL (ref 32.0–36.0)
MCV: 81.6 fL (ref 79.3–98.0)
MONO#: 0.3 10*3/uL (ref 0.1–0.9)
MONO%: 8.1 % (ref 0.0–14.0)
NEUT#: 2.2 10*3/uL (ref 1.5–6.5)
NEUT%: 64.3 % (ref 39.0–75.0)
Platelets: 137 10*3/uL — ABNORMAL LOW (ref 140–400)
RBC: 4.73 10*6/uL (ref 4.20–5.82)
RDW: 13.3 % (ref 11.0–14.6)
WBC: 3.4 10*3/uL — ABNORMAL LOW (ref 4.0–10.3)
lymph#: 0.8 10*3/uL — ABNORMAL LOW (ref 0.9–3.3)

## 2009-07-31 LAB — COMPREHENSIVE METABOLIC PANEL
ALT: 15 U/L (ref 0–53)
AST: 17 U/L (ref 0–37)
Albumin: 4 g/dL (ref 3.5–5.2)
Alkaline Phosphatase: 74 U/L (ref 39–117)
BUN: 12 mg/dL (ref 6–23)
CO2: 32 mEq/L (ref 19–32)
Calcium: 9.6 mg/dL (ref 8.4–10.5)
Chloride: 101 mEq/L (ref 96–112)
Creatinine, Ser: 0.81 mg/dL (ref 0.40–1.50)
Glucose, Bld: 148 mg/dL — ABNORMAL HIGH (ref 70–99)
Potassium: 4.1 mEq/L (ref 3.5–5.3)
Sodium: 137 mEq/L (ref 135–145)
Total Bilirubin: 0.6 mg/dL (ref 0.3–1.2)
Total Protein: 5.6 g/dL — ABNORMAL LOW (ref 6.0–8.3)

## 2009-07-31 LAB — LACTATE DEHYDROGENASE: LDH: 115 U/L (ref 94–250)

## 2009-12-20 IMAGING — CT NM PET TUM IMG SKULL BASE T - THIGH
6 series · 25 of 25 positions shown · IV contrast (350 OM)
Comparison: CT chest abdomen pelvis 07/18/2008

CLINICAL DATA: Hodgkin's disease diagnosed in 7008, initial
staging

NUCLEAR MEDICINE PET/CT
Fasting Blood Glucose:  210
TECHNIQUE: 19.3 mCi F-18 FDG was injected intravenously via the
right wrist.  Full-ring PET imaging was performed from the skull
base through the mid-thighs 68  minutes after injection.  CT data
was obtained and used for attenuation correction and anatomic
localization only.  (This was not acquired as a diagnostic CT
examination.)

[Series 1: pet ac · axial · 3.3mm · 4.69mm/px · z∈[-890,-20]mm · 6 of 267 slices shown]
[im 1/267]
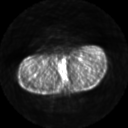
[im 54/267]
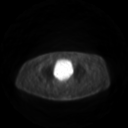
[im 107/267]
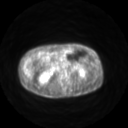
[im 160/267]
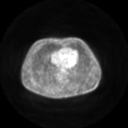
[im 213/267]
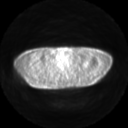
[im 267/267]
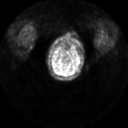

[Series 2: pet nac · axial · 3.3mm · 4.69mm/px · z∈[-890,-20]mm · 6 of 267 slices shown]
[im 1/267]
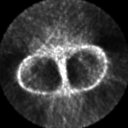
[im 54/267]
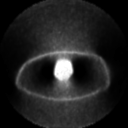
[im 107/267]
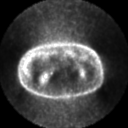
[im 160/267]
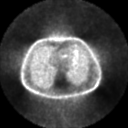
[im 213/267]
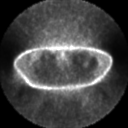
[im 267/267]
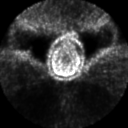

[Series 2: ct images · axial · 3.8mm · 0.98mm/px · z∈[-890,-20]mm · 6 of 267 slices shown]
[im 1/267]
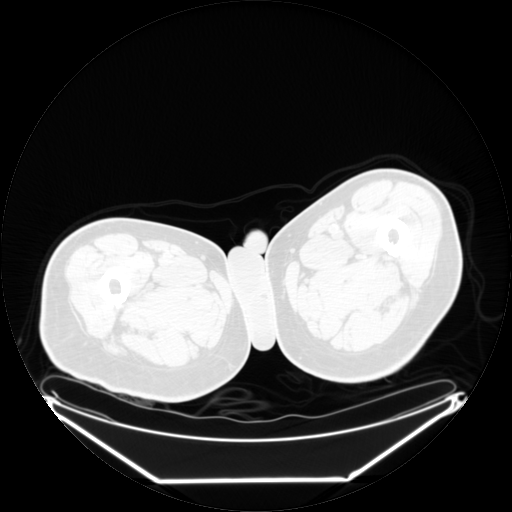
[im 54/267]
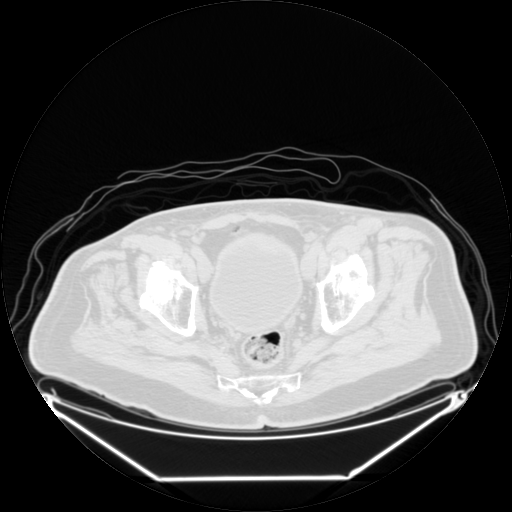
[im 107/267]
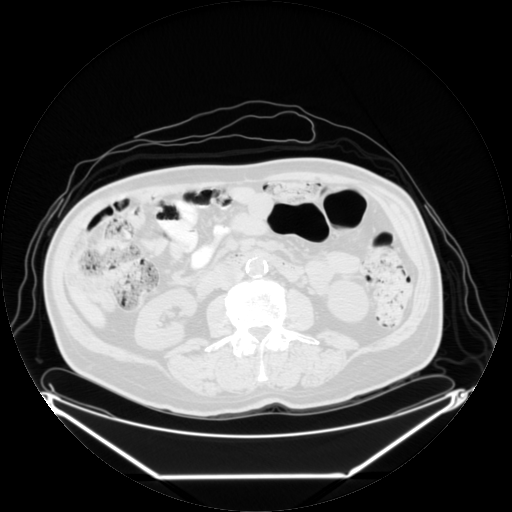
[im 160/267]
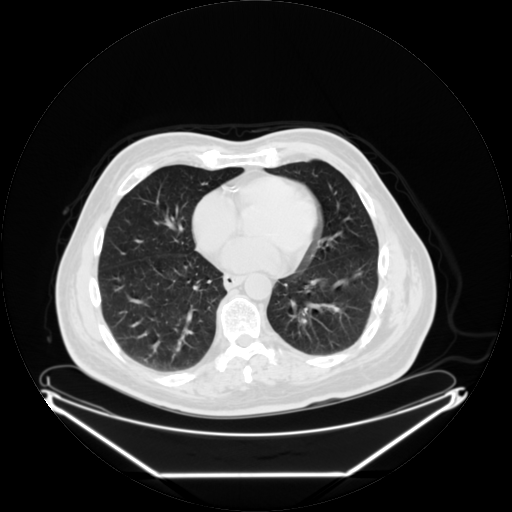
[im 213/267]
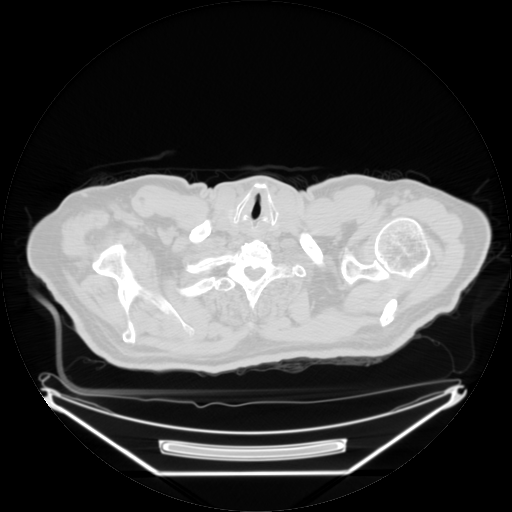
[im 267/267  brain]
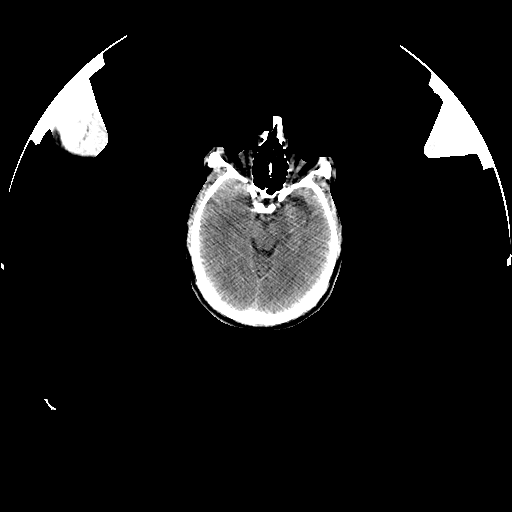

[Series 123: mip · coronal · 3.3mm · 4.69mm/px · 1 of 30 slices shown]
[im 1/30]
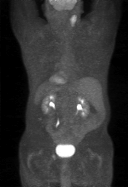

[Series 151: reformatted · axial · 3.3mm · 3.91mm/px · z∈[-821,-46]mm · 5 of 236 slices shown (1 of 2)]
[im 1/236]
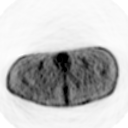
[im 59/236]
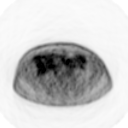
[im 118/236]
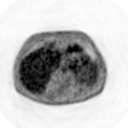
[im 177/236]
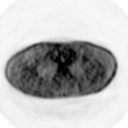
[im 236/236]
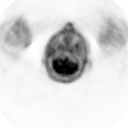

[Series 153: reformatted · coronal · 4.7mm · 6.98mm/px · 1 of 65 slices shown (2 of 2)]
[im 1/65]
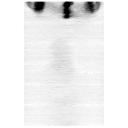

[25 of 25 positions shown; findings below may reference images not displayed]

FINDINGS: Neck: There is a cluster of a enlarged intensely hypermetabolic
right cervical lymph nodes within the level II B position with S U
V max equal 12.2 (image #27).  These nodes number approximately six
and measures up to 16 mm short axis and extend from the hyoid bone
to the level of the mastoid processes (image number 41 through 20

Chest:  No hypermetabolic mediastinal or hilar nodes.  No
suspicious pulmonary nodules.

Abdomen / Pelvis: No hypermetabolic activity within the solid
organs.  11 mm short axis periaortic lymph node (image 140)  has
low level metabolic activity with SUV max = 2.4.  This activity is
adjacent to the aorta and could potentially represent activity from
atherosclerosis.

Within the left inguinal region, 14 mm short axis lymph node with S
U V max equal 5.8 most consistent with lymphomatous involvement.

Skeleton: No focal hypermetabolic activity to suggest skeletal
metastasis.
IMPRESSION: 1.  Clustered of enlarged hypermetabolic nodes within the right
cervical chain consistent with Hodgkin's lymphoma.
2.  Evidence for lymphomatous nodal involvement below the diaphragm
with hypermetabolic left inguinal lymph node.
3.  Indeterminate left periaortic lymph node with low level F D G
activity.

## 2009-12-20 IMAGING — CT CT ABDOMEN W/ CM
2 of 4 series · 16 of 46 positions shown, 18 images · IV contrast (agent unspecified)
Comparison: PET CT scan and 07/18/2008

CT CHEST

CLINICAL DATA: Hodgkin's lymphoma diagnosed in 5778

CT CHEST, ABDOMEN AND PELVIS WITH CONTRAST
TECHNIQUE: Multidetector CT imaging of the chest, abdomen and
pelvis was performed following the standard protocol during bolus
administration of intravenous contrast.
Contrast: 100 ml  Omni 300

[Series 2: cap 5.0 b40f · axial · 0.73mm/px · z∈[-617,-22]mm · 13 of 131 slices shown, 15 images]
[im 6/131  soft-tissue]
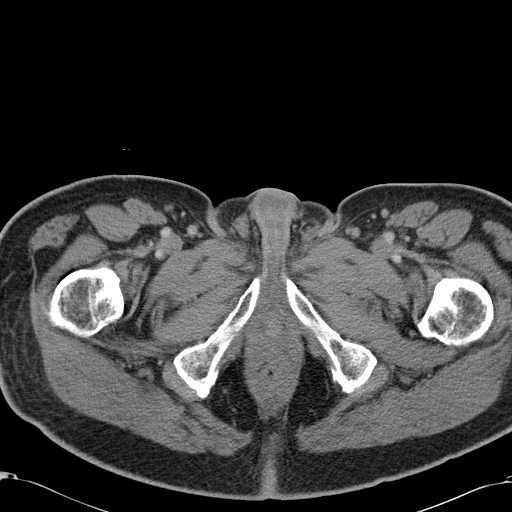
[im 6/131  bone]
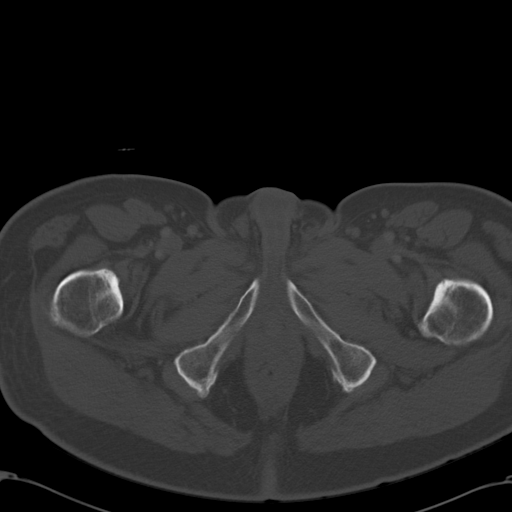
[im 17/131  soft-tissue]
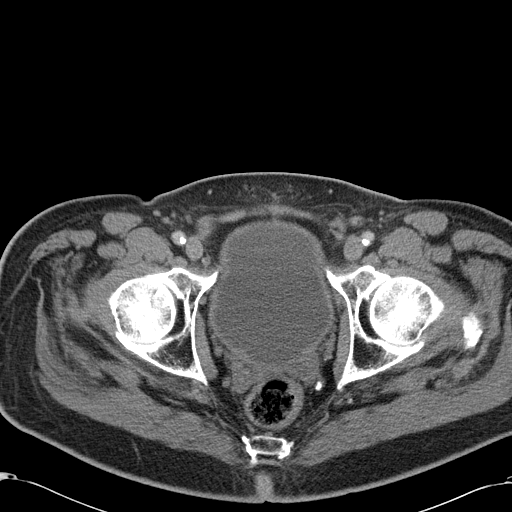
[im 28/131  soft-tissue]
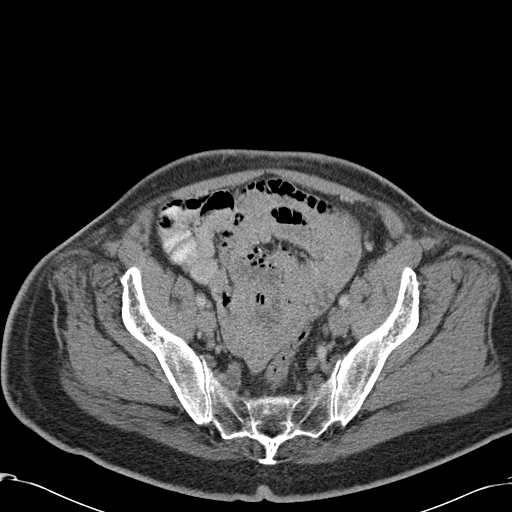
[im 38/131  soft-tissue]
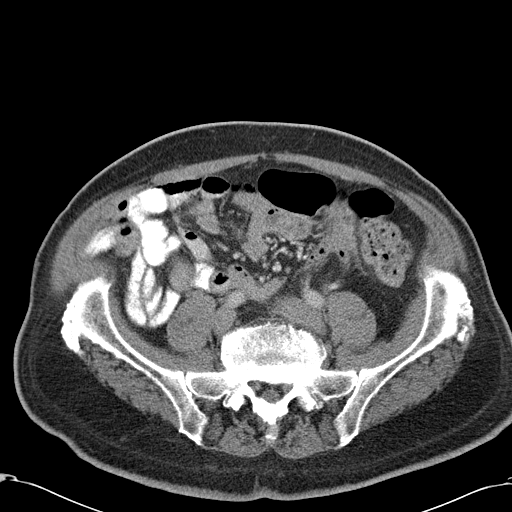
[im 44/131  soft-tissue]
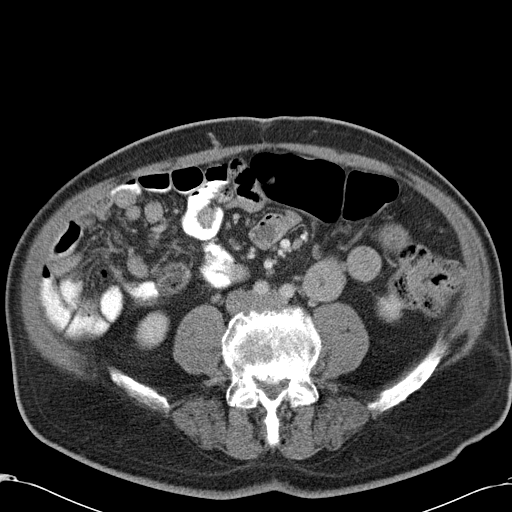
[im 55/131  soft-tissue]
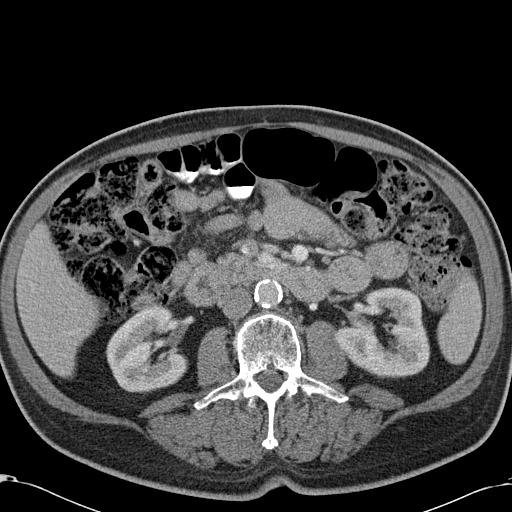
[im 66/131  soft-tissue]
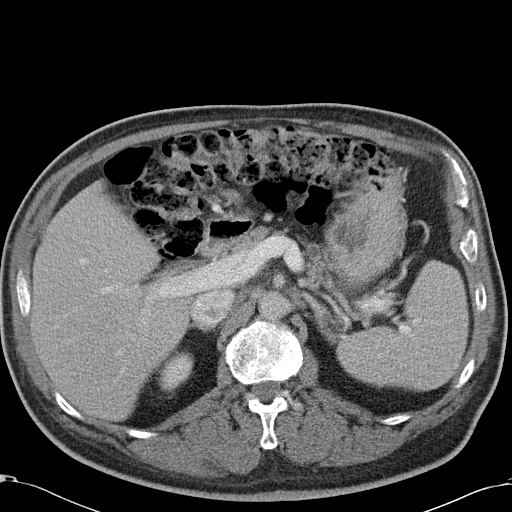
[im 76/131  soft-tissue]
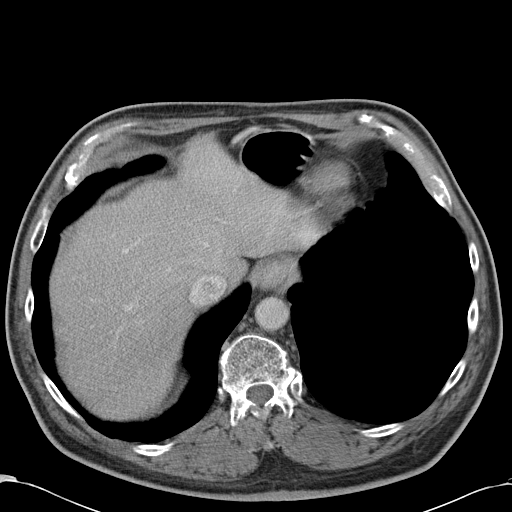
[im 87/131  soft-tissue]
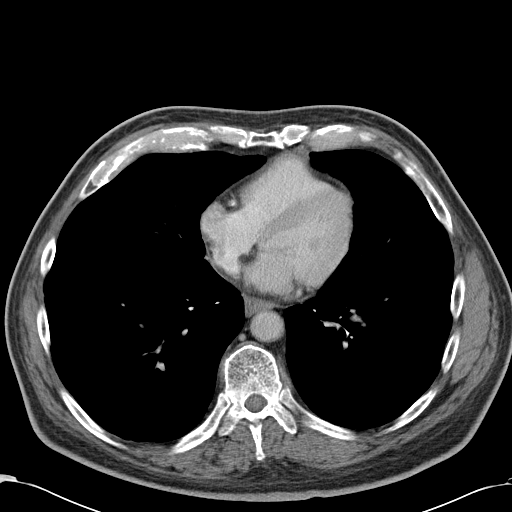
[im 87/131  bone]
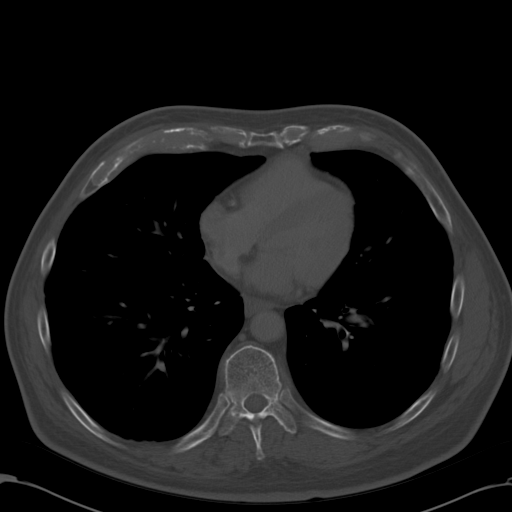
[im 93/131  soft-tissue]
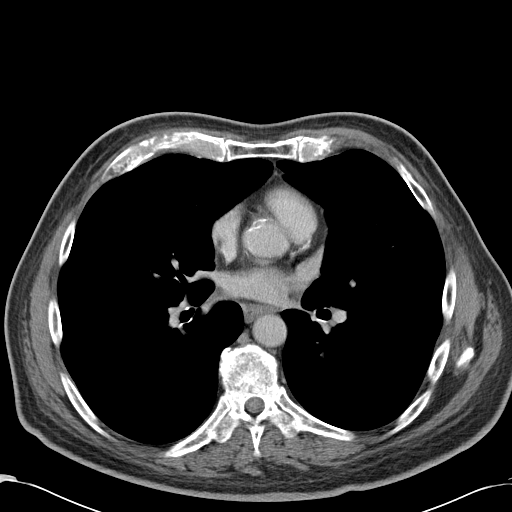
[im 103/131  soft-tissue]
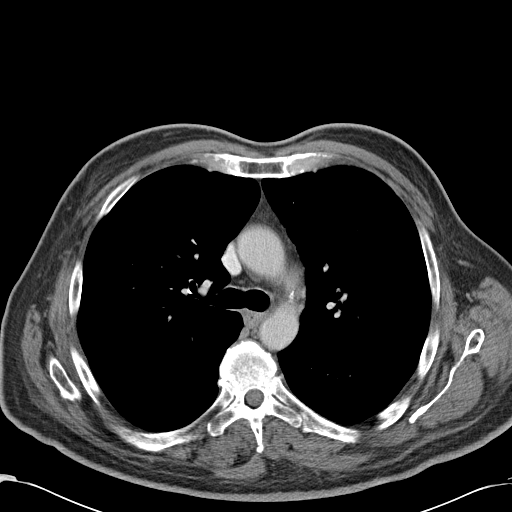
[im 114/131  soft-tissue]
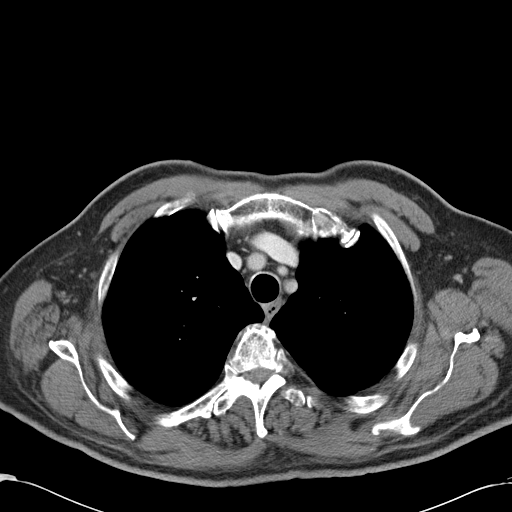
[im 125/131  soft-tissue]
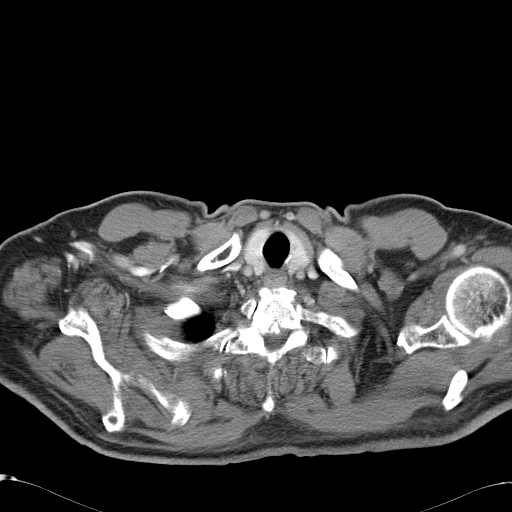

[Series 602: <mpr thick range> · coronal · 1.28mm/px · 3 of 102 slices shown]
[im 45/102  soft-tissue]
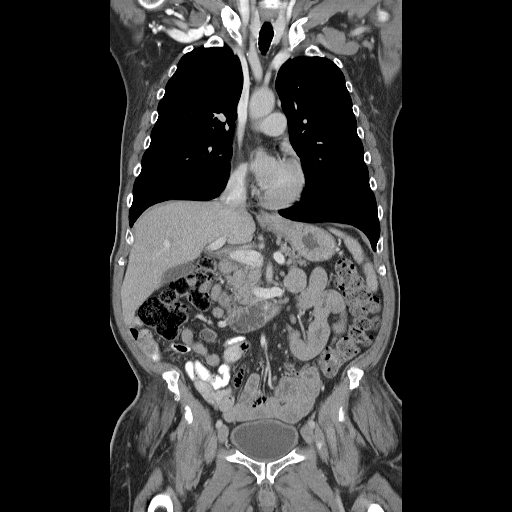
[im 57/102  soft-tissue]
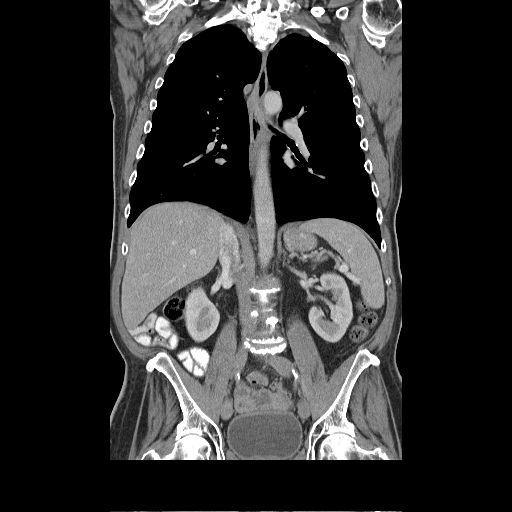
[im 68/102  soft-tissue]
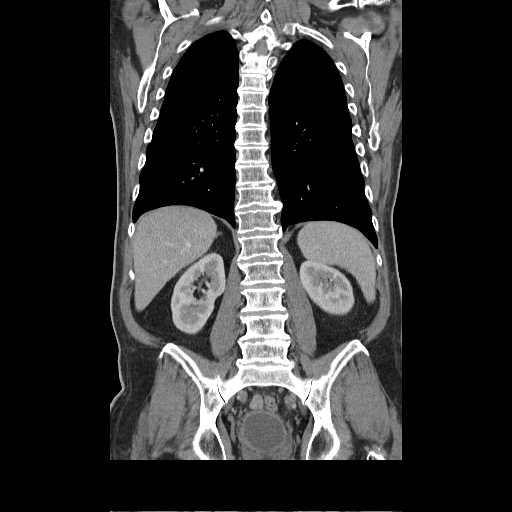

[16 of 46 positions shown; findings below may reference images not displayed]

FINDINGS: No evidence of axillary, supraclavicular, mediastinal,
or hilar lymphadenopathy. The heart appears normal.

Review of the lung parenchyma demonstrates a small 3 mm nodule in
the left lower lobe (image 36).  Airway appears normal.
IMPRESSION: 1. No evidence of thoracic metastasis.
2.  Small 3 mm left lower lobe pulmonary nodule.

CT ABDOMEN
FINDINGS: No focal hepatic lesion.  The gallbladder, spleen,
adrenal glands, and kidneys appear normal.  Small 8 mm cystic
lesion associated with the body of the pancreas (image 66).

 The stomach, duodenum, small bowel, and cecum appear normal.  The
appendix is not identified.  The colon appears normal. In the the
central mesentery of the small bowel there are small sub centimeter
lymph nodes.

Abdominal aorta is normal caliber.  There is a 10 mm peri aortic
lymph node at the level of the celiac trunk (image 64).  No
additional retroperitoneal or periportal adenopathy identified.
IMPRESSION: 1.  A 10 mm short axis periaortic lymph node with mild
hypermetabolic activity on comparison PET CT is concerning for
lymphoma below the diaphragm..
2.  Sub centimeter lymph nodes within the small bowel mesentery
without associated metabolic activity is nonspecific finding.
3.  Small 8 mm cystic lesion associated with the body of the
pancreas.  Findings likely represents small pseudocyst related to
pancreatitis.  If no such history, recommend follow-up MRI abdomen
in  6 months to evaluate for intraductal papillary mucinous tumor.

CT PELVIS
FINDINGS: The bladder, prostate, and seminal vesicles appear
normal.  The rectum and sigmoid colon appear normal.

There is a 14 mm short axis left inguinal lymph node (image 118).
A small 6 mm left common iliac node (image #90).

Review of  bone windows demonstrates no aggressive osseous lesions.
IMPRESSION: 1.  Enlarged left inguinal lymph node is hypermetabolic on
comparison PET CT and concerning for lymphomatous involvement below
the diaphragm.
2.  No evidence of bone metastasis.

## 2010-01-24 ENCOUNTER — Ambulatory Visit: Payer: Self-pay | Admitting: Internal Medicine

## 2010-01-25 ENCOUNTER — Ambulatory Visit (HOSPITAL_COMMUNITY): Admission: RE | Admit: 2010-01-25 | Discharge: 2010-01-25 | Payer: Self-pay | Admitting: Internal Medicine

## 2010-01-25 LAB — CBC WITH DIFFERENTIAL/PLATELET
BASO%: 0.3 % (ref 0.0–2.0)
Basophils Absolute: 0 10*3/uL (ref 0.0–0.1)
EOS%: 4.9 % (ref 0.0–7.0)
Eosinophils Absolute: 0.2 10*3/uL (ref 0.0–0.5)
HCT: 36.4 % — ABNORMAL LOW (ref 38.4–49.9)
HGB: 12.1 g/dL — ABNORMAL LOW (ref 13.0–17.1)
LYMPH%: 28.3 % (ref 14.0–49.0)
MCH: 27.7 pg (ref 27.2–33.4)
MCHC: 33.2 g/dL (ref 32.0–36.0)
MCV: 83.4 fL (ref 79.3–98.0)
MONO#: 0.3 10*3/uL (ref 0.1–0.9)
MONO%: 9.9 % (ref 0.0–14.0)
NEUT#: 1.8 10*3/uL (ref 1.5–6.5)
NEUT%: 56.6 % (ref 39.0–75.0)
Platelets: 142 10*3/uL (ref 140–400)
RBC: 4.36 10*6/uL (ref 4.20–5.82)
RDW: 14.2 % (ref 11.0–14.6)
WBC: 3.1 10*3/uL — ABNORMAL LOW (ref 4.0–10.3)
lymph#: 0.9 10*3/uL (ref 0.9–3.3)

## 2010-01-25 LAB — COMPREHENSIVE METABOLIC PANEL
ALT: 13 U/L (ref 0–53)
AST: 15 U/L (ref 0–37)
Albumin: 4.2 g/dL (ref 3.5–5.2)
Alkaline Phosphatase: 71 U/L (ref 39–117)
BUN: 22 mg/dL (ref 6–23)
CO2: 28 mEq/L (ref 19–32)
Calcium: 9.5 mg/dL (ref 8.4–10.5)
Chloride: 102 mEq/L (ref 96–112)
Creatinine, Ser: 0.99 mg/dL (ref 0.40–1.50)
Glucose, Bld: 142 mg/dL — ABNORMAL HIGH (ref 70–99)
Potassium: 4.8 mEq/L (ref 3.5–5.3)
Sodium: 140 mEq/L (ref 135–145)
Total Bilirubin: 0.4 mg/dL (ref 0.3–1.2)
Total Protein: 6 g/dL (ref 6.0–8.3)

## 2010-01-25 LAB — LACTATE DEHYDROGENASE: LDH: 124 U/L (ref 94–250)

## 2010-04-25 ENCOUNTER — Ambulatory Visit (HOSPITAL_BASED_OUTPATIENT_CLINIC_OR_DEPARTMENT_OTHER): Payer: Medicare Other | Admitting: Internal Medicine

## 2010-05-03 ENCOUNTER — Ambulatory Visit (HOSPITAL_COMMUNITY): Admission: RE | Admit: 2010-05-03 | Discharge: 2010-05-03 | Payer: Self-pay | Admitting: Internal Medicine

## 2010-05-03 LAB — CBC WITH DIFFERENTIAL/PLATELET
BASO%: 0.3 % (ref 0.0–2.0)
Basophils Absolute: 0 10*3/uL (ref 0.0–0.1)
EOS%: 5.1 % (ref 0.0–7.0)
Eosinophils Absolute: 0.1 10*3/uL (ref 0.0–0.5)
HCT: 37.6 % — ABNORMAL LOW (ref 38.4–49.9)
HGB: 12.7 g/dL — ABNORMAL LOW (ref 13.0–17.1)
LYMPH%: 28.2 % (ref 14.0–49.0)
MCH: 27.8 pg (ref 27.2–33.4)
MCHC: 33.8 g/dL (ref 32.0–36.0)
MCV: 82.4 fL (ref 79.3–98.0)
MONO#: 0.3 10*3/uL (ref 0.1–0.9)
MONO%: 10.1 % (ref 0.0–14.0)
NEUT#: 1.5 10*3/uL (ref 1.5–6.5)
NEUT%: 56.3 % (ref 39.0–75.0)
Platelets: 135 10*3/uL — ABNORMAL LOW (ref 140–400)
RBC: 4.57 10*6/uL (ref 4.20–5.82)
RDW: 13.8 % (ref 11.0–14.6)
WBC: 2.6 10*3/uL — ABNORMAL LOW (ref 4.0–10.3)
lymph#: 0.7 10*3/uL — ABNORMAL LOW (ref 0.9–3.3)

## 2010-05-03 LAB — COMPREHENSIVE METABOLIC PANEL
ALT: 13 U/L (ref 0–53)
AST: 14 U/L (ref 0–37)
Albumin: 4.4 g/dL (ref 3.5–5.2)
Alkaline Phosphatase: 85 U/L (ref 39–117)
BUN: 14 mg/dL (ref 6–23)
CO2: 29 mEq/L (ref 19–32)
Calcium: 9.5 mg/dL (ref 8.4–10.5)
Chloride: 101 mEq/L (ref 96–112)
Creatinine, Ser: 1.06 mg/dL (ref 0.40–1.50)
Glucose, Bld: 233 mg/dL — ABNORMAL HIGH (ref 70–99)
Potassium: 4.2 mEq/L (ref 3.5–5.3)
Sodium: 138 mEq/L (ref 135–145)
Total Bilirubin: 0.7 mg/dL (ref 0.3–1.2)
Total Protein: 6.3 g/dL (ref 6.0–8.3)

## 2010-05-03 LAB — LACTATE DEHYDROGENASE: LDH: 108 U/L (ref 94–250)

## 2010-10-18 ENCOUNTER — Other Ambulatory Visit: Payer: Self-pay | Admitting: Internal Medicine

## 2010-10-18 DIAGNOSIS — C8590 Non-Hodgkin lymphoma, unspecified, unspecified site: Secondary | ICD-10-CM

## 2010-10-21 ENCOUNTER — Encounter: Payer: Self-pay | Admitting: Dentistry

## 2010-11-02 ENCOUNTER — Ambulatory Visit: Payer: Medicare Other | Admitting: Internal Medicine

## 2010-11-02 DIAGNOSIS — C8589 Other specified types of non-Hodgkin lymphoma, extranodal and solid organ sites: Secondary | ICD-10-CM

## 2010-11-02 LAB — COMPREHENSIVE METABOLIC PANEL
ALT: 11 U/L (ref 0–53)
AST: 12 U/L (ref 0–37)
Albumin: 4.6 g/dL (ref 3.5–5.2)
Alkaline Phosphatase: 90 U/L (ref 39–117)
BUN: 15 mg/dL (ref 6–23)
CO2: 31 mEq/L (ref 19–32)
Calcium: 9.4 mg/dL (ref 8.4–10.5)
Chloride: 100 mEq/L (ref 96–112)
Creatinine, Ser: 0.98 mg/dL (ref 0.40–1.50)
Glucose, Bld: 169 mg/dL — ABNORMAL HIGH (ref 70–99)
Potassium: 4.7 mEq/L (ref 3.5–5.3)
Sodium: 141 mEq/L (ref 135–145)
Total Bilirubin: 0.6 mg/dL (ref 0.3–1.2)
Total Protein: 6.1 g/dL (ref 6.0–8.3)

## 2010-11-02 LAB — CBC WITH DIFFERENTIAL/PLATELET
BASO%: 0.2 % (ref 0.0–2.0)
Basophils Absolute: 0 10*3/uL (ref 0.0–0.1)
EOS%: 4.4 % (ref 0.0–7.0)
Eosinophils Absolute: 0.1 10*3/uL (ref 0.0–0.5)
HCT: 38.8 % (ref 38.4–49.9)
HGB: 13 g/dL (ref 13.0–17.1)
LYMPH%: 24.2 % (ref 14.0–49.0)
MCH: 26.9 pg — ABNORMAL LOW (ref 27.2–33.4)
MCHC: 33.6 g/dL (ref 32.0–36.0)
MCV: 80.1 fL (ref 79.3–98.0)
MONO#: 0.3 10*3/uL (ref 0.1–0.9)
MONO%: 10.3 % (ref 0.0–14.0)
NEUT#: 1.9 10*3/uL (ref 1.5–6.5)
NEUT%: 60.9 % (ref 39.0–75.0)
Platelets: 128 10*3/uL — ABNORMAL LOW (ref 140–400)
RBC: 4.85 10*6/uL (ref 4.20–5.82)
RDW: 14.4 % (ref 11.0–14.6)
WBC: 3.1 10*3/uL — ABNORMAL LOW (ref 4.0–10.3)
lymph#: 0.7 10*3/uL — ABNORMAL LOW (ref 0.9–3.3)

## 2010-11-02 LAB — LACTATE DEHYDROGENASE: LDH: 118 U/L (ref 94–250)

## 2010-11-05 ENCOUNTER — Encounter (HOSPITAL_COMMUNITY): Payer: Self-pay

## 2010-11-05 ENCOUNTER — Ambulatory Visit (HOSPITAL_COMMUNITY)
Admission: RE | Admit: 2010-11-05 | Discharge: 2010-11-05 | Disposition: A | Discharge status: Home / Self Care | Payer: Medicare Other | Source: Ambulatory Visit | Attending: Internal Medicine | Admitting: Internal Medicine

## 2010-11-05 DIAGNOSIS — Z981 Arthrodesis status: Secondary | ICD-10-CM | POA: Insufficient documentation

## 2010-11-05 DIAGNOSIS — C8590 Non-Hodgkin lymphoma, unspecified, unspecified site: Secondary | ICD-10-CM

## 2010-11-05 DIAGNOSIS — R599 Enlarged lymph nodes, unspecified: Secondary | ICD-10-CM | POA: Insufficient documentation

## 2010-11-05 DIAGNOSIS — Z9221 Personal history of antineoplastic chemotherapy: Secondary | ICD-10-CM | POA: Insufficient documentation

## 2010-11-05 DIAGNOSIS — C8190 Hodgkin lymphoma, unspecified, unspecified site: Secondary | ICD-10-CM | POA: Insufficient documentation

## 2010-11-05 HISTORY — DX: Malignant (primary) neoplasm, unspecified: C80.1

## 2010-11-05 LAB — GLUCOSE, CAPILLARY: Glucose-Capillary: 192 mg/dL — ABNORMAL HIGH (ref 70–99)

## 2010-11-05 MED ORDER — FLUDEOXYGLUCOSE F - 18 (FDG) INJECTION: 16.2000 | Freq: Once | INTRAVENOUS | Status: AC | PRN

## 2010-11-07 ENCOUNTER — Encounter (HOSPITAL_BASED_OUTPATIENT_CLINIC_OR_DEPARTMENT_OTHER): Payer: Medicare Other | Admitting: Internal Medicine

## 2010-11-07 DIAGNOSIS — C8191 Hodgkin lymphoma, unspecified, lymph nodes of head, face, and neck: Secondary | ICD-10-CM

## 2010-11-09 ENCOUNTER — Other Ambulatory Visit: Payer: Self-pay | Admitting: Internal Medicine

## 2010-11-09 DIAGNOSIS — R599 Enlarged lymph nodes, unspecified: Secondary | ICD-10-CM

## 2010-11-20 ENCOUNTER — Other Ambulatory Visit: Payer: Self-pay | Admitting: Internal Medicine

## 2010-11-20 ENCOUNTER — Ambulatory Visit (HOSPITAL_COMMUNITY)
Admission: RE | Admit: 2010-11-20 | Discharge: 2010-11-20 | Disposition: A | Discharge status: Home / Self Care | Payer: Medicare Other | Source: Ambulatory Visit | Attending: Internal Medicine | Admitting: Internal Medicine

## 2010-11-20 ENCOUNTER — Other Ambulatory Visit: Payer: Self-pay | Admitting: Diagnostic Radiology

## 2010-11-20 ENCOUNTER — Ambulatory Visit (HOSPITAL_COMMUNITY): Payer: Medicare Other

## 2010-11-20 DIAGNOSIS — R599 Enlarged lymph nodes, unspecified: Secondary | ICD-10-CM

## 2010-11-20 DIAGNOSIS — C8191 Hodgkin lymphoma, unspecified, lymph nodes of head, face, and neck: Secondary | ICD-10-CM | POA: Insufficient documentation

## 2010-11-20 LAB — PROTIME-INR
INR: 0.99 (ref 0.00–1.49)
Prothrombin Time: 13.3 seconds (ref 11.6–15.2)

## 2010-11-20 LAB — CBC
HCT: 40.5 % (ref 39.0–52.0)
Hemoglobin: 13.1 g/dL (ref 13.0–17.0)
MCH: 26 pg (ref 26.0–34.0)
MCHC: 32.3 g/dL (ref 30.0–36.0)
MCV: 80.4 fL (ref 78.0–100.0)
Platelets: 134 10*3/uL — ABNORMAL LOW (ref 150–400)
RBC: 5.04 MIL/uL (ref 4.22–5.81)
RDW: 13.7 % (ref 11.5–15.5)
WBC: 3.8 10*3/uL — ABNORMAL LOW (ref 4.0–10.5)

## 2010-11-20 LAB — GLUCOSE, CAPILLARY
Glucose-Capillary: 168 mg/dL — ABNORMAL HIGH (ref 70–99)
Glucose-Capillary: 164 mg/dL — ABNORMAL HIGH (ref 70–99)

## 2010-11-20 LAB — APTT: aPTT: 30 seconds (ref 24–37)

## 2010-11-28 ENCOUNTER — Encounter (HOSPITAL_BASED_OUTPATIENT_CLINIC_OR_DEPARTMENT_OTHER): Payer: Medicare Other | Admitting: Internal Medicine

## 2010-11-28 ENCOUNTER — Other Ambulatory Visit: Payer: Self-pay | Admitting: Internal Medicine

## 2010-11-28 DIAGNOSIS — C8191 Hodgkin lymphoma, unspecified, lymph nodes of head, face, and neck: Secondary | ICD-10-CM

## 2010-11-28 LAB — COMPREHENSIVE METABOLIC PANEL
ALT: 11 U/L (ref 0–53)
AST: 15 U/L (ref 0–37)
Albumin: 4.6 g/dL (ref 3.5–5.2)
Alkaline Phosphatase: 88 U/L (ref 39–117)
BUN: 15 mg/dL (ref 6–23)
CO2: 32 mEq/L (ref 19–32)
Calcium: 10.1 mg/dL (ref 8.4–10.5)
Chloride: 100 mEq/L (ref 96–112)
Creatinine, Ser: 0.97 mg/dL (ref 0.40–1.50)
Glucose, Bld: 122 mg/dL — ABNORMAL HIGH (ref 70–99)
Potassium: 4.9 mEq/L (ref 3.5–5.3)
Sodium: 140 mEq/L (ref 135–145)
Total Bilirubin: 0.5 mg/dL (ref 0.3–1.2)
Total Protein: 6.7 g/dL (ref 6.0–8.3)

## 2010-11-28 LAB — CBC WITH DIFFERENTIAL/PLATELET
BASO%: 0.2 % (ref 0.0–2.0)
Basophils Absolute: 0 10*3/uL (ref 0.0–0.1)
EOS%: 5.6 % (ref 0.0–7.0)
Eosinophils Absolute: 0.2 10*3/uL (ref 0.0–0.5)
HCT: 39.2 % (ref 38.4–49.9)
HGB: 13.2 g/dL (ref 13.0–17.1)
LYMPH%: 23.7 % (ref 14.0–49.0)
MCH: 26.8 pg — ABNORMAL LOW (ref 27.2–33.4)
MCHC: 33.7 g/dL (ref 32.0–36.0)
MCV: 79.5 fL (ref 79.3–98.0)
MONO#: 0.3 10*3/uL (ref 0.1–0.9)
MONO%: 9.5 % (ref 0.0–14.0)
NEUT#: 1.9 10*3/uL (ref 1.5–6.5)
NEUT%: 61 % (ref 39.0–75.0)
Platelets: 117 10*3/uL — ABNORMAL LOW (ref 140–400)
RBC: 4.93 10*6/uL (ref 4.20–5.82)
RDW: 13.6 % (ref 11.0–14.6)
WBC: 3.1 10*3/uL — ABNORMAL LOW (ref 4.0–10.3)
lymph#: 0.7 10*3/uL — ABNORMAL LOW (ref 0.9–3.3)

## 2010-11-28 LAB — LACTATE DEHYDROGENASE: LDH: 130 U/L (ref 94–250)

## 2010-12-03 ENCOUNTER — Ambulatory Visit: Payer: Medicare Other | Attending: Radiation Oncology | Admitting: Radiation Oncology

## 2010-12-03 DIAGNOSIS — K121 Other forms of stomatitis: Secondary | ICD-10-CM | POA: Insufficient documentation

## 2010-12-03 DIAGNOSIS — Z510 Encounter for antineoplastic radiation therapy: Secondary | ICD-10-CM | POA: Insufficient documentation

## 2010-12-03 DIAGNOSIS — C8191 Hodgkin lymphoma, unspecified, lymph nodes of head, face, and neck: Secondary | ICD-10-CM | POA: Insufficient documentation

## 2010-12-03 DIAGNOSIS — K1230 Oral mucositis (ulcerative), unspecified: Secondary | ICD-10-CM | POA: Insufficient documentation

## 2010-12-03 DIAGNOSIS — Y842 Radiological procedure and radiotherapy as the cause of abnormal reaction of the patient, or of later complication, without mention of misadventure at the time of the procedure: Secondary | ICD-10-CM | POA: Insufficient documentation

## 2010-12-03 DIAGNOSIS — R1310 Dysphagia, unspecified: Secondary | ICD-10-CM | POA: Insufficient documentation

## 2010-12-03 DIAGNOSIS — Z79899 Other long term (current) drug therapy: Secondary | ICD-10-CM | POA: Insufficient documentation

## 2010-12-14 LAB — GLUCOSE, CAPILLARY: Glucose-Capillary: 231 mg/dL — ABNORMAL HIGH (ref 70–99)

## 2010-12-18 LAB — GLUCOSE, CAPILLARY: Glucose-Capillary: 155 mg/dL — ABNORMAL HIGH (ref 70–99)

## 2011-01-03 LAB — GLUCOSE, CAPILLARY: Glucose-Capillary: 173 mg/dL — ABNORMAL HIGH (ref 70–99)

## 2011-01-06 LAB — GLUCOSE, CAPILLARY: Glucose-Capillary: 131 mg/dL — ABNORMAL HIGH (ref 70–99)

## 2011-01-09 LAB — GLUCOSE, CAPILLARY: Glucose-Capillary: 180 mg/dL — ABNORMAL HIGH (ref 70–99)

## 2011-01-15 LAB — GLUCOSE, CAPILLARY: Glucose-Capillary: 234 mg/dL — ABNORMAL HIGH (ref 70–99)

## 2011-02-12 NOTE — Assessment & Plan Note (Signed)
[facility]                            CARDIOLOGY OFFICE NOTE   NAME:Cody Wood, Cody Wood                     MRN:          [id number]  DATE:12/09/2007                            DOB:          [birth year]    PRIMARY CARE PHYSICIAN:  Cody Wood, M.D.   REASON FOR PRESENTATION:  A patient with chest pain.   HISTORY OF PRESENT ILLNESS:  The patient is very pleasant 65 year old  gentleman without prior cardiac history but with significant  cardiovascular risk factors.  He is somewhat limited because of spine  problems.  However, he does occasionally help his son lift and haul  things.  The other day he picked up 60 pounds of material and carried it  some distance.  He developed a sharp left shoulder discomfort.  It was  severe.  He dropped to his knees.  He could not breathe and it lasted  for several minutes.  It subsequently subsided on its own.  He had not  had discomfort like this before.  At that point he was not describing  associated symptoms or other associated symptoms or radiation of the  discomfort.  Later that night, however, he was sitting.  He then  developed substernal chest pressure.  This lasted for about 30 minutes.  He was slightly nauseated.  He had some mild shortness of breath.  He  has had some mild diaphoresis.  He did not have radiation to his jaw or  to his arms at that point.  He waited it out and it went away.  He did  not take anything to try to get rid of it.  He had not had this kind of  discomfort in the past.  He says at other times he can be active,  perhaps not as exerting as the above, and not get any symptoms.  Again,  he is not overly active because of the back problems.  He has had no  symptoms since that time.   PAST MEDICAL HISTORY:  Hyperlipidemia times a few years, diabetes  mellitus since 1991, esophageal stricture status post dilatation.   PAST SURGICAL HISTORY:  Neck surgery in 2003.   ALLERGIES:   None.   MEDICATIONS:  Lisinopril 10 mg daily, metformin 1000 mg b.i.d.,  lovastatin 40 mg daily, Actos 45 mg daily, Lantus 10 units q.a.m.,  glimepiride 2 mg b.i.d..   SOCIAL HISTORY:  The patient is disabled from his back problems.  He is  divorced.  He has 3 children.  He smoked 2 packs per day for 40 years  but quit 12 years ago.  He has never really drank alcohol regularly.  He  completely abstains.   FAMILY HISTORY:  Noncontributory for early coronary disease.  Father  died from alcohol abuse at age 17.  His mother died from alcohol abuse  at age 69.  He is not sure of his brother's history.   REVIEW OF SYSTEMS:  As stated in the HPI, positive occasional dizziness  bending over, reflux, joint pains, back pain.  Negative for other  systems.   PHYSICAL EXAMINATION:  The  patient is pleasant and in no distress.  Blood pressure 144/69, heart 76 and regular.  Weight 209 pounds, body  mass index 31.  HEENT:  Eyelids are unremarkable.  Pupils equal, round, reactive to  light.  Fundi not visualized.  Oral mucosa unremarkable.  NECK:  No jugular distention 45 degrees, carotid upstroke brisk and  symmetrical.  No bruits, thyromegaly.  LYMPHATICS:  No cervical, axillary, or inguinal adenopathy.  LUNGS:  Clear to auscultation bilaterally.  BACK:  No costovertebral tenderness.  CHEST:  Unremarkable.  HEART:  PMI not displaced or sustained.  S1, S2 within normal limits.  No S3, no S4.  No clicks, rubs, murmurs.  ABDOMEN:  Obese.  Positive bowel sounds, normal in frequency and pitch.  No bruits, rebound, guarding or midline pulsatile mass.  No  hepatosplenomegaly or splenomegaly.  SKIN:  No rashes, no nodules.  EXTREMITIES:  2+ pulses throughout.  No edema, cyanosis or clubbing.  NEURO:  Oriented person, place, time.  Cranial nerves II-XII grossly  intact.  Motor grossly intact.   EKG (done at Dr. Lucie Leather office) sinus rhythm, rate 58, axis within  normal limits, intervals within  normal limits.  Baseline artifact  precludes adequate analysis but no obvious acute ST changes or infarct  pattern.   ASSESSMENT/PLAN:  1. Chest.  The patient's chest was worrisome for unstable angina.  He      has not had any in the past week.  He does have significant      cardiovascular risk factors.  I think the pretest probability of      obstructive coronary disease is at least moderate.  I am going to      start with a stress perfusion study.  If he has a normal perfusion      study and no further symptoms, then we will concentrate on primary      risk reduction.  However, if he has an abnormal perfusion study or      future symptoms, then he would need cardiac catheterization.  2. Hypertension.  Blood pressure is slightly elevated here but he      reports good control otherwise.  He needs weight loss, which would      help with his blood pressure control.  This will be followed by Dr.      Caryl Never .  3. Dyslipidemia.  The goal being LDL less than 70 and HDL greater than      40.  I will defer to his primary care physician.  4. Diabetes.  He reports good control per Dr. Caryl Never.  5. Followup will be as needed based on future symptoms or the results      of the stress test above.     Rollene Rotunda, MD, [facility]  Electronically Signed    JH/MedQ  DD: 12/09/2007  DT: 12/10/2007  Job #: [id number]   cc:   Cody Wood, M.D.

## 2011-02-15 NOTE — Procedures (Signed)
NAME:  ROHITH, FAUTH              ACCOUNT NO.:  [id number]   MEDICAL RECORD NO.:  [id number]          PATIENT TYPE:  EMS   LOCATION:  ED                            FACILITY:  [facility]   PHYSICIAN:  Edward L. Juanetta Gosling, M.D.DATE OF BIRTH:  Sep 20, 1946   DATE OF PROCEDURE:  05/07/2005  DATE OF DISCHARGE:  05/08/2005                                EKG INTERPRETATION   Time is 2344, May 07, 2005. The rhythm is sinus rhythm with PVCs. There is  suggestion of left ventricular hypertrophy. There are T-wave abnormalities  inferiorly and laterally which could indicate ischemia versus changes from  LVH. Abnormal electrocardiogram.      Oneal Deputy. Juanetta Gosling, M.D.  Electronically Signed     ELH/MEDQ  D:  05/17/2005  T:  05/17/2005  Job:  [id number]

## 2011-02-15 NOTE — Op Note (Signed)
NAME:  Cody, Wood              ACCOUNT NO.:  [id number]   MEDICAL RECORD NO.:  [id number]          PATIENT TYPE:  AMB   LOCATION:  DSC                          FACILITY:  [facility]   PHYSICIAN:  Cody Wood, M.D. DATE OF BIRTH:  Jul 20, 1946   DATE OF PROCEDURE:  02/26/2005  DATE OF DISCHARGE:                                 OPERATIVE REPORT   PREOP DIAGNOSIS:  Full-thickness skin loss left long finger pulp due to  crushing injury sustained 1 week prior.   POSTOP DIAGNOSIS:  Full-thickness skin loss left long finger pulp due to  crushing injury sustained 1 week prior.   OPERATION:  Full-thickness skin graft from left medial brachium to left long  finger.   OPERATING SURGEON:  Cody Wood, M.D.   ASSISTANT:  Cody Wood, P.A.-C.   ANESTHESIA:  General by LMA, supervising anesthesiologist Cody Wood, M.D.   INDICATIONS:  Cody Wood is a 65 year old disabled gentleman who  sustained a severe crushing injury to his left long finger when he was  moving a steel beam.   He avulsed the entire pulp of his left long finger. He was seen at the [facility] Emergency Room 1 week prior where he was noted to have a full-thickness  skin avulsion.   His wound was quite untidy.   We started him on a series of dressing changes and whirlpool baths in an  effort to cleanse his wound. He subsequently was scheduled for  reconstruction of the fingertip at this time with a full-thickness skin  graft.   Cody Wood does have type 2 diabetes; therefore, we recommended that he  not try a flap coverage, for example, a cross-finger flap or thenar flap.   After informed consent he was brought to the operating at this time.   He is a smoker and was advised to minimize a smoking during the healing of  his graft.   DESCRIPTION OF PROCEDURE:  Cody Wood is brought to the operating room  and placed in supine position on the operating table.   The preoperative blood sugar was  noted be 142.   Following anesthesia consultation by Dr. Gypsy Wood, general anesthesia by LMA  was induced.   The left arm was prepped with Betadine soap solution and sterilely draped. A  pneumatic tourniquet was applied to the proximal brachium.   Following exsanguination of the left arm an Esmarch bandage, the arterial  tourniquet was inflated to 150 mmHg.   The procedure commenced with harvest of a graft measuring approximately 2.5  x 2 cm from the medial brachium.   This was removed full-thickness, subsequently defatted and thinned to a  thick partial thickness graft.   Care was taken to harvest the skin from a nonhair bearing area.   The donor site was prepped by debridement of fibrin with a saline-soaked  sponge followed by inset of the graft with multiple mattress sutures trauma-  style of 5-0 nylon.   The donor site was repaired with intradermal 3-0 Prolene, a Steri-Strip, and  a Tegaderm dressing.   Cody Wood is placed in a  compressive dressing on his finger and an Ace  wrap on the arm.   For aftercare, he is provided a prescription for Percocet 5 mg 1 p.o. q.4-  6h. p.r.n. pain, 20 tablets without refill. He is also provided doxycycline  100 mg 1 p.o. b.i.d. times 5 days as a prophylactic antibiotic.      RVS/MEDQ  D:  02/26/2005  T:  02/26/2005  Job:  [id number]   cc:   Cody Wood, M.D.  [address]  [place]  [place] [address]  Fax: [phone]

## 2011-02-15 NOTE — Op Note (Signed)
NAME:  Cody Wood, Cody Wood                          ACCOUNT NO.:  [id number]   MEDICAL RECORD NO.:  [id number]                   PATIENT TYPE:  INP   LOCATION:  3003                                 FACILITY:  [facility]   PHYSICIAN:  Cristi Loron, M.D.            DATE OF BIRTH:  08/22/1946   DATE OF PROCEDURE:  06/09/2002  DATE OF DISCHARGE:                                 OPERATIVE REPORT   PREOPERATIVE DIAGNOSIS:  C3-C4, C4-C5, C5-C6 herniated nucleus pulposus,  degenerative disk disease, spondylosis, stenosis, cervical myelopathy.   POSTOPERATIVE DIAGNOSIS:  C3-C4, C4-C5, C5-C6 herniated nucleus pulposus,  degenerative disk disease, spondylosis, stenosis, cervical myelopathy.   OPERATION:  C3-C4, C4-C5, C5-C6 anterior cervical diskectomy/decompression;  interbody iliac crest allograft arthrodesis; anterior spinal instrumentation  C3 to C6 using Premier titanium plate and screws.   SURGEON:  Cristi Loron, M.D.   ASSISTANT:  Danae Orleans. Venetia Maxon, M.D.   ANESTHESIA:  General endotracheal.   ESTIMATED BLOOD LOSS:  300 cc.   SPECIMENS:  None.   DRAINS:  None.   COMPLICATIONS:  None.   BRIEF HISTORY:  The patient is a 65 -year-old white male who has suffered  neck, arm, and leg pain, numbness, tingling, and weakness.  His physical  exam was consistent with cervical myelopathy.  He was worked up with a  cervical MRI which demonstrated a large herniated disk at C4-C5 with  evidence of spinal cord injury as well as significant herniations at C3-C4  and C5-C6.  I discussed the various treatment options with the patient and  recommended he undergo anterior cervical diskectomy with fusion and plating.  The patient weighed the risks, benefits, and alternatives to surgery and  elected to proceed with the operation.   DESCRIPTION OF PROCEDURE:  The patient was brought to the operating room by  the anesthesia team and general endotracheal anesthesia was induced.  The  patient  remained in the supine position and a roll was placed under his  shoulders to place his neck in slight extension.  His anterior cervical  region was then prepared with Betadine Scrub and Betadine Solution.  Sterile  drapes were applied.  I then injected the area to be incised with Marcaine  with epinephrine solution and used a scalpel to make the transverse incision  in the patient's left anterior neck.  The Metzenbaum scissors were used to  divide the platysma muscle and then to dissect medial to the  sternocleidomastoid muscle, jugular vein,  and carotid artery and bluntly  dissected down towards the anterior cervical spine carefully identifying the  esophagus and retracting it medially.  I then used the Kitner swabs to  _________ the cervical spine and inserted a bent spinal needle into the  upper exposed interspace.  We obtained an intraoperative radiograph to  confirm our location.   I then used electrocautery to detach the medial border of the longus colli  muscle bilaterally  from C3-C4, C4-C5, and C5-C6 intervertebral disk spaces,  inserted the Caspar self-retaining retractor.  I began at C4-C5, incised the  C4-C5 intervertebral disk with a 15 blade scalpel and then performed partial  diskectomy using the pituitary forceps and Carlens curets.  Distraction  screws were placed in C4 and C5.  The interspace was distracted and then  high-speed drill was used to decorticate the endplates at C4-C5 and remove  the remainder of C4-C5 intervertebral disk as well as to thin out a portion  of the posterior longitudinal ligament and drill away some posterior  spondylosis.  The thinned out ligament was then excised with a _____________  knife and removed with the Kerrison punch undercutting the vertebral  endplates and decompressing the thecal sac.  A foraminotomy was then  performed about the bilateral C5 nerve roots.   This procedure was repeated in a similar fashion at both C3-C4 and then  at  C5-C6 decompressing the thecal sac at C3-C4 and C5-C6 as well as the  bilateral C4 and C6 nerve roots.   Having completed the anterior cervical diskectomies/decompression we now  carried our attention to the spinal arthrodesis.  We obtained iliac crest,  cortical allograft bone graft and ____________.  Two grafts were 7 mm x 7 mm  in height __________.  They were inserted into the distracted C3-C4 and C5-  C6 interspace.   A 9 mm x 1 cm was inserted into the distracted C4-C5 interspace.  The  distraction screws were removed and we noted good stable fit of the bone  graft _____________.   We now turned our attention to the anterior spinal instrumentation. We used  a high-speed drill to remove some spondylosis so that the plate would lie  flat.  We chose the appropriate Premier anterior cervical plate, laid it  along the anterior aspect of the vertebral bodies from C6 up to C3.  Drilled  two holes at each vertebral bodies and inserted two 14-mm screws at C3, C4,  C5, and C6.  We obtained intraoperative radiograph that demonstrated good  position of the screws.  The lowest screws could not be seen because of the  patient's body habitus and shoulders but it looked good in vivo.  We then  locked the screws into the plate using sliding lock .We then achieved  meticulous hemostasis using bipolar electrocautery and Gelfoam.  The wound  was then irrigated out with Bacitracin solution.  The solution was removed  as well as the Caspar self-retaining retractor.  The esophagus was inspected  for any damage; there was none apparent.  We then reapproximated the  platysma muscle with interrupted 3-0 Vicryl suture, subcutaneous tissue with  interrupted 3-0 Vicryl suture, and the skin with Steri-Strips and Benzoin.  The wound was covered with Bacitracin ointment and sterile dressings were  applied and the drapes were removed.  The patient was subsequently extubated by the anesthesia team and  transported to the postanesthesia care unit in  stable condition.  All sponge, instrument, and needle counts were correct at  the end of the case.                                               Cristi Loron, M.D.    JDJ/MEDQ  D:  06/09/2002  T:  06/10/2002  Job:  [id number]

## 2011-02-15 NOTE — Consult Note (Signed)
NAME:  Cody Wood, Cody Wood              ACCOUNT NO.:  [id number]   MEDICAL RECORD NO.:  [id number]          PATIENT TYPE:  EMS   LOCATION:  [facility]                         FACILITY:  [facility]   PHYSICIAN:  Katy Fitch. Sypher, M.D. DATE OF BIRTH:  01/04/1946   DATE OF CONSULTATION:  DATE OF DISCHARGE:                                   CONSULTATION   This represents a probable hand trauma consult.   CHIEF COMPLAINT:  Avulsion of palmar surface of left long finger.   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   Early today, while trying to lift a 24-foot long iron beam he estimates  weighing 1000 pounds, the beam shifted trapping his left long finger. This  injury avulsed the soft tissue on the palmar surface of his left long finger  leading to a gaping ragged wound.   He was brought to the [facility] emergency room were he was evaluated by Dr. Donnetta Hutching.   On examination, he was noted to have stable vital signs with a temperature  of 97.7, blood pressure 143/83, pulse 65 and regular, respirations 20. He  reportedly had type 2 diabetes and is normally followed by Dr. Dara Hoyer  at [facility].   Due to a lack of funds he has not been taking his diabetes medications since  February 2006. He denies polydipsia and polyuria and states that he has been  very active and is controlling his diabetes by exercise.   His past medical history is removed. He reports no drug allergies. He is on  no routine medications, although he should be on an oral hypoglycemic. He  reports that he quit eight years prior. He does not use alcoholic beverages,  but does chew tobacco.   PHYSICAL EXAMINATION:  His clinical examination reveals an awake and alert  65 year old man. He has a ragged, avulsive injury to his left long finger.  He is noted to  have no signs of significant injury to his thumb, index,  ring, or small fingers. He has an IV in place and is NPO.   X-rays of his fingers are reviewed. He was noted to have soft tissue  avulsion of the left long finger with multiple radiopaque foreign bodies  noted. There is no sign of fracture of his distal, middle, or proximal  phalanges. He does have osteoarthrosis with particular involvement of the  distal interphalangeal joint consistent with his age and past history of  osteoarthrosis.   ASSESSMENT:  He is status post avulsion injury of left long finger.   PLAN:  We have recommended that Mr. Paluch undergo digital block to allow  thorough of his wound. After informed consent and alcohol/Betadine prep, he  is injected with 2% lidocaine.   With anesthesia satisfactory, a thorough scrubbing and debridement of his  left long finger is accomplished, followed by dressing the finger with  Xeroflow, sterile gauze, and a voluminous hand dressing.   He will  be seen in follow up on Feb 18, 2005, for examination of his wounds  and initiation of a reconstruction program.   We will either cover his finger with a cross finger flap or do to his  history of diabetes it may be more beneficial to simply perform a 4th of the  skin graft.   We will check his CBG and have urged him to seek follow-up evaluation either  at [facility] or with Dr. Marzetta Board office so that he may resume management  of his type 2 diabetes.      RVS/MEDQ  D:  02/15/2005  T:  02/15/2005  Job:  [id number]

## 2011-02-15 NOTE — Op Note (Signed)
NAME:  Cody Wood, Cody Wood              ACCOUNT NO.:  [id number]   MEDICAL RECORD NO.:  [id number]          PATIENT TYPE:  AMB   LOCATION:  DAY                          FACILITY:  [facility]   PHYSICIAN:  Thomas A. Cornett, M.D.DATE OF BIRTH:  1945-12-07   DATE OF PROCEDURE:  08/22/2006  DATE OF DISCHARGE:                                 OPERATIVE REPORT   PREOPERATIVE DIAGNOSIS:  Large right inner thigh mass.   POSTOPERATIVE DIAGNOSIS:  Large right inner thigh mass.   PROCEDURE:  Excision of large right inner thigh mass, probable lipoma  measuring 10 x 15 cm superficial.   SURGEON:  Thomas A. Cornett, M.D.   ANESTHESIA:  LMA with 20 mL of 0.25% Sensorcaine local.   ESTIMATED BLOOD LOSS:  20 mL   SPECIMEN:  Probable large right thigh lipoma to pathology.   INDICATIONS FOR PROCEDURE:  The patient is a 65 year old male who has had an  irregular mass in the inner aspect of his right thigh.  It began to impede  him from walking at times due to the friction from the mass in his other  thigh.  He wished to have it excised.  He presents today for that.   DESCRIPTION OF PROCEDURE:  The patient was brought to the operating room,  placed supine.  After induction of LMA anesthesia, his right leg was  slightly bent and laterally rotated to expose the mass at the inner aspect  of his right thigh.  This was prepped and draped in a sterile fashion.  I  infiltrated the skin with 0.25% Sensorcaine.  An ellipse was made around the  mass.  The entire mass was excised which appeared to be a large lipoma  measuring at least 10 x 15 x 10 cm.  This was taken down to the fascia of  the muscle and excised quite easily.  I then closed the wound in layers  using 3-0 Vicryl and 4-0 Monocryl subcuticular stitch.  Steri-Strips and dry  dressings were applied.  All final counts of sponge, needle and instruments  were found be correct at this portion of the case.  The patient was awoke  and taken  to recovery in  satisfactory condition.      Thomas A. Cornett, M.D.  Electronically Signed     TAC/MEDQ  D:  08/22/2006  T:  08/22/2006  Job:  [id number]   cc:   Teena Irani. Arlyce Dice, M.D.  Fax: [phone]

## 2011-02-22 ENCOUNTER — Ambulatory Visit
Admission: RE | Admit: 2011-02-22 | Discharge: 2011-02-22 | Disposition: A | Discharge status: Home / Self Care | Payer: Medicare Other | Source: Ambulatory Visit | Attending: Radiation Oncology | Admitting: Radiation Oncology

## 2011-03-07 ENCOUNTER — Ambulatory Visit: Payer: Medicare Other | Admitting: Radiation Oncology

## 2011-04-05 ENCOUNTER — Ambulatory Visit
Admission: RE | Admit: 2011-04-05 | Discharge: 2011-04-05 | Disposition: A | Discharge status: Home / Self Care | Payer: Medicare Other | Source: Ambulatory Visit | Attending: Radiation Oncology | Admitting: Radiation Oncology

## 2011-04-10 ENCOUNTER — Inpatient Hospital Stay (HOSPITAL_COMMUNITY): Admission: RE | Admit: 2011-04-10 | Payer: Medicare Other | Source: Ambulatory Visit

## 2011-04-12 ENCOUNTER — Encounter (HOSPITAL_COMMUNITY): Payer: Self-pay

## 2011-04-12 ENCOUNTER — Encounter (HOSPITAL_COMMUNITY)
Admission: RE | Admit: 2011-04-12 | Discharge: 2011-04-12 | Disposition: A | Discharge status: Home / Self Care | Payer: Medicare Other | Source: Ambulatory Visit | Attending: Internal Medicine | Admitting: Internal Medicine

## 2011-04-12 DIAGNOSIS — C8190 Hodgkin lymphoma, unspecified, unspecified site: Secondary | ICD-10-CM | POA: Insufficient documentation

## 2011-04-12 LAB — GLUCOSE, CAPILLARY: Glucose-Capillary: 199 mg/dL — ABNORMAL HIGH (ref 70–99)

## 2011-04-12 MED ORDER — FLUDEOXYGLUCOSE F - 18 (FDG) INJECTION
16.0000 | Freq: Once | INTRAVENOUS | Status: AC | PRN
  Administered 2011-04-12: 16 via INTRAVENOUS

## 2011-04-23 ENCOUNTER — Encounter (HOSPITAL_BASED_OUTPATIENT_CLINIC_OR_DEPARTMENT_OTHER): Payer: Medicare Other | Admitting: Internal Medicine

## 2011-04-23 ENCOUNTER — Other Ambulatory Visit: Payer: Self-pay | Admitting: Internal Medicine

## 2011-04-23 DIAGNOSIS — C8191 Hodgkin lymphoma, unspecified, lymph nodes of head, face, and neck: Secondary | ICD-10-CM

## 2011-04-23 LAB — COMPREHENSIVE METABOLIC PANEL
ALT: 10 U/L (ref 0–53)
AST: 12 U/L (ref 0–37)
Albumin: 4.5 g/dL (ref 3.5–5.2)
Alkaline Phosphatase: 81 U/L (ref 39–117)
BUN: 20 mg/dL (ref 6–23)
CO2: 28 mEq/L (ref 19–32)
Calcium: 10.2 mg/dL (ref 8.4–10.5)
Chloride: 98 mEq/L (ref 96–112)
Creatinine, Ser: 1.04 mg/dL (ref 0.50–1.35)
Glucose, Bld: 151 mg/dL — ABNORMAL HIGH (ref 70–99)
Potassium: 4.9 mEq/L (ref 3.5–5.3)
Sodium: 137 mEq/L (ref 135–145)
Total Bilirubin: 0.9 mg/dL (ref 0.3–1.2)
Total Protein: 6.6 g/dL (ref 6.0–8.3)

## 2011-04-23 LAB — CBC WITH DIFFERENTIAL/PLATELET
BASO%: 0.4 % (ref 0.0–2.0)
Basophils Absolute: 0 10*3/uL (ref 0.0–0.1)
EOS%: 3.2 % (ref 0.0–7.0)
Eosinophils Absolute: 0.1 10*3/uL (ref 0.0–0.5)
HCT: 39.2 % (ref 38.4–49.9)
HGB: 13.2 g/dL (ref 13.0–17.1)
LYMPH%: 18.9 % (ref 14.0–49.0)
MCH: 27.2 pg (ref 27.2–33.4)
MCHC: 33.7 g/dL (ref 32.0–36.0)
MCV: 80.9 fL (ref 79.3–98.0)
MONO#: 0.3 10*3/uL (ref 0.1–0.9)
MONO%: 10.7 % (ref 0.0–14.0)
NEUT#: 2.2 10*3/uL (ref 1.5–6.5)
NEUT%: 66.8 % (ref 39.0–75.0)
Platelets: 142 10*3/uL (ref 140–400)
RBC: 4.84 10*6/uL (ref 4.20–5.82)
RDW: 14.7 % — ABNORMAL HIGH (ref 11.0–14.6)
WBC: 3.3 10*3/uL — ABNORMAL LOW (ref 4.0–10.3)
lymph#: 0.6 10*3/uL — ABNORMAL LOW (ref 0.9–3.3)

## 2011-04-23 LAB — LACTATE DEHYDROGENASE: LDH: 104 U/L (ref 94–250)

## 2011-04-30 ENCOUNTER — Encounter (HOSPITAL_BASED_OUTPATIENT_CLINIC_OR_DEPARTMENT_OTHER): Payer: Medicare Other | Admitting: Internal Medicine

## 2011-04-30 DIAGNOSIS — Z87898 Personal history of other specified conditions: Secondary | ICD-10-CM

## 2011-07-01 LAB — GLUCOSE, CAPILLARY: Glucose-Capillary: 210 — ABNORMAL HIGH

## 2011-07-05 LAB — GLUCOSE, CAPILLARY: Glucose-Capillary: 92 mg/dL (ref 70–99)

## 2011-08-25 ENCOUNTER — Emergency Department (HOSPITAL_COMMUNITY): Payer: Medicare Other

## 2011-08-25 ENCOUNTER — Other Ambulatory Visit: Payer: Self-pay

## 2011-08-25 ENCOUNTER — Encounter (HOSPITAL_COMMUNITY): Payer: Self-pay | Admitting: Emergency Medicine

## 2011-08-25 ENCOUNTER — Emergency Department (HOSPITAL_COMMUNITY)
Admission: EM | Admit: 2011-08-25 | Discharge: 2011-08-25 | Disposition: A | Discharge status: Home / Self Care | Payer: Medicare Other | Attending: Emergency Medicine | Admitting: Emergency Medicine

## 2011-08-25 DIAGNOSIS — R079 Chest pain, unspecified: Secondary | ICD-10-CM | POA: Insufficient documentation

## 2011-08-25 DIAGNOSIS — M25519 Pain in unspecified shoulder: Secondary | ICD-10-CM | POA: Insufficient documentation

## 2011-08-25 DIAGNOSIS — R209 Unspecified disturbances of skin sensation: Secondary | ICD-10-CM | POA: Insufficient documentation

## 2011-08-25 DIAGNOSIS — E119 Type 2 diabetes mellitus without complications: Secondary | ICD-10-CM | POA: Insufficient documentation

## 2011-08-25 DIAGNOSIS — E785 Hyperlipidemia, unspecified: Secondary | ICD-10-CM | POA: Insufficient documentation

## 2011-08-25 DIAGNOSIS — R110 Nausea: Secondary | ICD-10-CM | POA: Insufficient documentation

## 2011-08-25 DIAGNOSIS — R0602 Shortness of breath: Secondary | ICD-10-CM | POA: Insufficient documentation

## 2011-08-25 DIAGNOSIS — M79609 Pain in unspecified limb: Secondary | ICD-10-CM | POA: Insufficient documentation

## 2011-08-25 DIAGNOSIS — I10 Essential (primary) hypertension: Secondary | ICD-10-CM | POA: Insufficient documentation

## 2011-08-25 HISTORY — DX: Acute coronary thrombosis not resulting in myocardial infarction: I24.0

## 2011-08-25 LAB — COMPREHENSIVE METABOLIC PANEL WITH GFR
ALT: 14 U/L (ref 0–53)
AST: 13 U/L (ref 0–37)
Albumin: 3.8 g/dL (ref 3.5–5.2)
Alkaline Phosphatase: 92 U/L (ref 39–117)
BUN: 20 mg/dL (ref 6–23)
CO2: 30 meq/L (ref 19–32)
Calcium: 9.7 mg/dL (ref 8.4–10.5)
Chloride: 96 meq/L (ref 96–112)
Creatinine, Ser: 0.87 mg/dL (ref 0.50–1.35)
GFR calc Af Amer: >90 mL/min
GFR calc non Af Amer: 88 mL/min — ABNORMAL LOW
Glucose, Bld: 331 mg/dL — ABNORMAL HIGH (ref 70–99)
Potassium: 5.2 meq/L — ABNORMAL HIGH (ref 3.5–5.1)
Sodium: 133 meq/L — ABNORMAL LOW (ref 135–145)
Total Bilirubin: 0.6 mg/dL (ref 0.3–1.2)
Total Protein: 6.6 g/dL (ref 6.0–8.3)

## 2011-08-25 LAB — CBC
HCT: 39.1 % (ref 39.0–52.0)
Hemoglobin: 12.8 g/dL — ABNORMAL LOW (ref 13.0–17.0)
MCH: 26.1 pg (ref 26.0–34.0)
MCHC: 32.7 g/dL (ref 30.0–36.0)
MCV: 79.8 fL (ref 78.0–100.0)
Platelets: 130 10*3/uL — ABNORMAL LOW (ref 150–400)
RBC: 4.9 MIL/uL (ref 4.22–5.81)
RDW: 14.1 % (ref 11.5–15.5)
WBC: 3 10*3/uL — ABNORMAL LOW (ref 4.0–10.5)

## 2011-08-25 LAB — CARDIAC PANEL(CRET KIN+CKTOT+MB+TROPI)
CK, MB: 3.8 ng/mL (ref 0.3–4.0)
Relative Index: 3.8 — ABNORMAL HIGH (ref 0.0–2.5)
Total CK: 101 U/L (ref 7–232)
Troponin I: <0.3 ng/mL

## 2011-08-25 LAB — PROTIME-INR
INR: 1 (ref 0.00–1.49)
Prothrombin Time: 13.4 s (ref 11.6–15.2)

## 2011-08-25 LAB — PRO B NATRIURETIC PEPTIDE: Pro B Natriuretic peptide (BNP): 15.1 pg/mL (ref 0–125)

## 2011-08-25 LAB — TROPONIN I: Troponin I: <0.3 ng/mL (ref <0.30)

## 2011-08-25 LAB — APTT: aPTT: 30 s (ref 24–37)

## 2011-08-25 MED ORDER — SODIUM CHLORIDE 0.9 % IV SOLN
999.0000 mL | INTRAVENOUS | Status: DC
  Administered 2011-08-25: 999 mL via INTRAVENOUS

## 2011-08-25 MED ORDER — ASPIRIN 81 MG PO CHEW
324.0000 mg | CHEWABLE_TABLET | Freq: Once | ORAL | Status: AC
  Administered 2011-08-25: 324 mg via ORAL
  Filled 2011-08-25: qty 4

## 2011-08-25 NOTE — ED Notes (Signed)
Second set CE negative. Will discharge home to f/u with her cardiologist tomorrow at 0900 as previously discussed.  Forbes Cellar, MD 08/25/11 1622

## 2011-08-25 NOTE — ED Notes (Signed)
Placed on cardiac monitor. Normal sinus rhythm noted.

## 2011-08-25 NOTE — ED Notes (Signed)
Pt reports numbness in L arm improving but still present. Denies pain or other symptoms. Awaiting ct.

## 2011-08-25 NOTE — ED Notes (Signed)
Resting quietly in bed. Denies pain. Sinus rhythm on monitor. Resp even and unlabored. NAD noted. Awaiting disposition.

## 2011-08-25 NOTE — ED Provider Notes (Addendum)
History     CSN: [id number] Arrival date & time: 08/25/2011 10:44 AM   First MD Initiated Contact with Patient 08/25/11 1116      Chief Complaint  Patient presents with  . Numbness    Pt awoke with left arm numbness    (Consider location/radiation/quality/duration/timing/severity/associated sxs/prior treatment) Patient is a 65 y.o. male presenting with chest pain. The history is provided by the patient and medical records.  Chest Pain The chest pain began 3 - 5 hours ago. Duration of episode(s) is 1 hour. Chest pain occurs constantly. The chest pain is resolved. At its most intense, the pain is at 5/10. The pain is currently at 0/10. The severity of the pain is moderate. The quality of the pain is described as dull. The pain radiates to the left shoulder and left arm. Primary symptoms include shortness of breath and nausea. Pertinent negatives for primary symptoms include no fever, no fatigue, no syncope, no cough, no wheezing, no palpitations, no abdominal pain, no vomiting, no dizziness and no altered mental status.  The shortness of breath began today. The shortness of breath developed suddenly. The shortness of breath is moderate.  Nausea began today.  Associated symptoms include numbness.  Pertinent negatives for associated symptoms include no claudication, no diaphoresis, no lower extremity edema, no near-syncope, no orthopnea, no paroxysmal nocturnal dyspnea and no weakness. He tried nothing for the symptoms. Risk factors include male gender.  His past medical history is significant for diabetes, hyperlipidemia and hypertension.  Pertinent negatives for past medical history include no CAD.  Pertinent negatives for family medical history include: no CAD in family.  Procedure history is positive for exercise treadmill test. Procedure history comments: The patient reports that he had a stress test approximately one month ago showing blockages in the coronary circulation.     Past  Medical History  Diagnosis Date  . Cancer   . Diabetes mellitus   . Blockage of coronary artery of heart     Past Surgical History  Procedure Date  . Knee surgery   . Mandible surgery   . Neck surgery     History reviewed. No pertinent family history.  History  Substance Use Topics  . Smoking status: Never Smoker   . Smokeless tobacco: Current User    Types: Chew  . Alcohol Use: No      Review of Systems  Constitutional: Negative for fever, diaphoresis and fatigue.  Respiratory: Positive for shortness of breath. Negative for cough and wheezing.   Cardiovascular: Positive for chest pain. Negative for palpitations, orthopnea, claudication, syncope and near-syncope.  Gastrointestinal: Positive for nausea. Negative for vomiting and abdominal pain.  Neurological: Positive for numbness. Negative for dizziness and weakness.  Psychiatric/Behavioral: Negative for altered mental status.  All other systems reviewed and are negative.    Allergies  Review of patient's allergies indicates no known allergies.  Home Medications   Current Outpatient Rx  Name Route Sig Dispense Refill  . ASPIRIN 81 MG PO TABS Oral Take 81 mg by mouth daily.      Marland Kitchen CARVEDILOL 3.125 MG PO TABS Oral Take 3.125 mg by mouth 2 (two) times daily with a meal.      . LISINOPRIL 10 MG PO TABS Oral Take 10 mg by mouth daily.      Marland Kitchen PIOGLITAZONE HCL 45 MG PO TABS Oral Take 45 mg by mouth daily.      Marland Kitchen ROSUVASTATIN CALCIUM 20 MG PO TABS Oral Take 20 mg by mouth  daily.        BP 141/86  Pulse 63  Temp(Src) 97.6 F (36.4 C) (Oral)  Resp 22  SpO2 98%  Physical Exam  Nursing note and vitals reviewed. Constitutional: He is oriented to person, place, and time. He appears well-developed and well-nourished. No distress.  HENT:  Head: Normocephalic and atraumatic.  Mouth/Throat: Oropharynx is clear and moist.  Eyes: EOM are normal. Pupils are equal, round, and reactive to light.  Neck: Normal range of  motion. Neck supple. No JVD present. No tracheal deviation present.  Cardiovascular: Normal rate, regular rhythm, S1 normal, S2 normal, normal heart sounds and intact distal pulses.   No extrasystoles are present. PMI is not displaced.  Exam reveals no gallop and no friction rub.   No murmur heard. Pulmonary/Chest: Effort normal and breath sounds normal. No accessory muscle usage or stridor. Not tachypneic. No respiratory distress. He has no decreased breath sounds. He has no wheezes. He has no rhonchi. He has no rales. He exhibits no tenderness, no bony tenderness, no crepitus and no retraction.  Abdominal: Soft. Bowel sounds are normal. He exhibits no distension and no mass. There is no tenderness. There is no rebound and no guarding.  Musculoskeletal: Normal range of motion. He exhibits no edema and no tenderness.  Neurological: He is alert and oriented to person, place, and time. He has normal strength. He displays no tremor and normal reflexes. No cranial nerve deficit or sensory deficit. He exhibits normal muscle tone. Coordination normal. GCS eye subscore is 4. GCS verbal subscore is 5. GCS motor subscore is 6.  Reflex Scores:      Tricep reflexes are 2+ on the right side and 2+ on the left side.      Bicep reflexes are 2+ on the right side and 2+ on the left side.      Brachioradialis reflexes are 2+ on the right side and 2+ on the left side.      Patellar reflexes are 2+ on the right side and 2+ on the left side.      Achilles reflexes are 2+ on the right side and 2+ on the left side.      No cranial nerve deficits, negative pronator drift, normal finger-nose-finger test for coordination, normal peripheral field of vision on confrontation, normal sensation and strength throughout the upper and lower extremities. No focal neurologic deficits noted.  Skin: Skin is warm and dry. No rash noted. He is not diaphoretic. No erythema. No pallor.  Psychiatric: He has a normal mood and affect. His  behavior is normal. Judgment and thought content normal.    ED Course  Procedures (including critical care time)  Date: 08/25/2011  Rate: 64   Rhythm: normal sinus rhythm  QRS Axis: normal  Intervals: normal  ST/T Wave abnormalities: normal  Conduction Disutrbances:none  Narrative Interpretation: Non-provocative EKG  Old EKG Reviewed: unchanged    Labs Reviewed  PRO B NATRIURETIC PEPTIDE  CBC  CARDIAC PANEL(CRET KIN+CKTOT+MB+TROPI)  COMPREHENSIVE METABOLIC PANEL  PROTIME-INR  APTT   No results found.   No diagnosis found.    MDM  Myocardial infarction, unstable angina, ACS, Stroke, TIA, Musculoskeletal chest pain, costochondritis, GERD, Gastrointestinal Chest Pain, Pleuritic Chest Pain, Pneumonia, Pneumothorax, Pulmonary Embolism, Esophageal Spasm, Arrhythmia considered among other potential etiologies in the patient's differential diagnosis.  3:08 PM All that and spoke with the patient's cardiologist, Dr. Jacinto Halim, and discussed the patient's presentation, his symptoms, his medical history, and his results of his workup, as well  as his recent stress test which was suggestive of some septal ischemia. The patient has had no further chest pain while in the emergency department. Dr. Jacinto Halim states that he would like the patient to be discharged home provided that a repeat set of cardiac enzymes is negative, and to followup in his office tomorrow morning at 9:00 AM for further evaluation and to arrange an outpatient cardiac catheterization. I discussed this plan with the patient who states his understanding of and agreement with the plan of care.        Felisa Bonier, MD 08/25/11 0454  Felisa Bonier, MD 08/25/11 1330  Felisa Bonier, MD 08/25/11 [phone]

## 2011-08-25 NOTE — ED Notes (Signed)
Pt reports woke up at 0700 with left arm numbness. Pt went to sleep at 0100 this morning and was normal at that time.

## 2011-08-26 ENCOUNTER — Encounter (HOSPITAL_COMMUNITY): Payer: Self-pay | Admitting: Pharmacy Technician

## 2011-08-28 ENCOUNTER — Encounter (HOSPITAL_COMMUNITY): Payer: Self-pay | Admitting: Pharmacy Technician

## 2011-09-03 ENCOUNTER — Ambulatory Visit (HOSPITAL_COMMUNITY)
Admission: RE | Admit: 2011-09-03 | Discharge: 2011-09-03 | Disposition: A | Discharge status: Home / Self Care | Payer: Medicare Other | Source: Ambulatory Visit | Attending: Cardiology | Admitting: Cardiology

## 2011-09-03 ENCOUNTER — Encounter (HOSPITAL_COMMUNITY)
Admission: RE | Disposition: A | Discharge status: Home / Self Care | Payer: Self-pay | Source: Ambulatory Visit | Attending: Cardiology

## 2011-09-03 DIAGNOSIS — I10 Essential (primary) hypertension: Secondary | ICD-10-CM | POA: Insufficient documentation

## 2011-09-03 DIAGNOSIS — R079 Chest pain, unspecified: Secondary | ICD-10-CM | POA: Diagnosis present

## 2011-09-03 DIAGNOSIS — I2510 Atherosclerotic heart disease of native coronary artery without angina pectoris: Secondary | ICD-10-CM | POA: Insufficient documentation

## 2011-09-03 DIAGNOSIS — E785 Hyperlipidemia, unspecified: Secondary | ICD-10-CM | POA: Insufficient documentation

## 2011-09-03 DIAGNOSIS — E119 Type 2 diabetes mellitus without complications: Secondary | ICD-10-CM | POA: Insufficient documentation

## 2011-09-03 HISTORY — PX: LEFT HEART CATHETERIZATION WITH CORONARY ANGIOGRAM: SHX5451

## 2011-09-03 LAB — GLUCOSE, CAPILLARY: Glucose-Capillary: 249 mg/dL — ABNORMAL HIGH (ref 70–99)

## 2011-09-03 SURGERY — LEFT HEART CATHETERIZATION WITH CORONARY ANGIOGRAM
Anesthesia: LOCAL

## 2011-09-03 MED ORDER — SODIUM CHLORIDE 0.9 % IJ SOLN
3.0000 mL | INTRAMUSCULAR | Status: DC | PRN
  Administered 2011-09-03: 3 mL via INTRAVENOUS

## 2011-09-03 MED ORDER — LIDOCAINE HCL (PF) 1 % IJ SOLN
INTRAMUSCULAR | Status: AC
  Filled 2011-09-03: qty 30

## 2011-09-03 MED ORDER — VERAPAMIL HCL 2.5 MG/ML IV SOLN
INTRAVENOUS | Status: AC
  Filled 2011-09-03: qty 2

## 2011-09-03 MED ORDER — HYDROMORPHONE HCL PF 2 MG/ML IJ SOLN
INTRAMUSCULAR | Status: AC
  Filled 2011-09-03: qty 1

## 2011-09-03 MED ORDER — ACETAMINOPHEN 325 MG PO TABS: 650.0000 mg | ORAL_TABLET | ORAL | Status: DC | PRN

## 2011-09-03 MED ORDER — HEPARIN (PORCINE) IN NACL 2-0.9 UNIT/ML-% IJ SOLN
INTRAMUSCULAR | Status: AC
  Filled 2011-09-03: qty 2000

## 2011-09-03 MED ORDER — SODIUM CHLORIDE 0.9 % IV SOLN
250.0000 mL | INTRAVENOUS | Status: DC | PRN
  Administered 2011-09-03: 95 mL via INTRAVENOUS

## 2011-09-03 MED ORDER — NITROGLYCERIN 0.2 MG/ML ON CALL CATH LAB
INTRAVENOUS | Status: AC
  Filled 2011-09-03: qty 1

## 2011-09-03 MED ORDER — HEPARIN SODIUM (PORCINE) 1000 UNIT/ML IJ SOLN
INTRAMUSCULAR | Status: AC
  Filled 2011-09-03: qty 1

## 2011-09-03 MED ORDER — MIDAZOLAM HCL 2 MG/2ML IJ SOLN
INTRAMUSCULAR | Status: AC
  Filled 2011-09-03: qty 2

## 2011-09-03 MED ORDER — ASPIRIN 81 MG PO CHEW
324.0000 mg | CHEWABLE_TABLET | ORAL | Status: AC
  Administered 2011-09-03: 324 mg via ORAL
  Filled 2011-09-03: qty 4

## 2011-09-03 MED ORDER — ONDANSETRON HCL 4 MG/2ML IJ SOLN: 4.0000 mg | Freq: Four times a day (QID) | INTRAMUSCULAR | Status: DC | PRN

## 2011-09-03 NOTE — Brief Op Note (Signed)
09/03/2011  12:18 PM  PATIENT: Cody Wood  PRE-OPERATIVE DIAGNOSIS:  Chest pain   POST-OPERATIVE DIAGNOSIS: Same  PROCEDURE (S):  leftHEART CATHETERIZATION WITH CORONARY/LV ANGIOGRAM  Cardiologist: Jeanella Cara, MD, [facility].:    Referring MD: PCP      DICTATION: .Other Dictation: Dictation Number [id number]   PATIENT DISPOSITION:  Short Stay

## 2011-09-04 NOTE — Cardiovascular Report (Signed)
NAME:  BERDELL, HOSTETLER NO.:  [id number]  MEDICAL RECORD NO.:  [id number]  LOCATION:  [facility]                         FACILITY:  [facility]  PHYSICIAN:  Pamella Pert, MD DATE OF BIRTH:  [birth year]  DATE OF PROCEDURE:  09/03/2011 DATE OF DISCHARGE:  09/03/2011                           CARDIAC CATHETERIZATION   PROCEDURE PERFORMED: 1. Left ventriculography. 2. Selective right and left coronary arteriography.  INDICATION:  [name] is a pleasant 65 year old gentleman with history of hypertension, hyperlipidemia, diabetes who had been complaining of chest pain suggestive of angina pectoris.  He was on medical therapy only.  He was recently evaluated by an outpatient stress test, which revealed a diaphragmatic attenuation but possible inferoseptal ischemia.  However, it was considered at low risk, hence, he was treated with lifestyle modification.  However, he presented to the emergency room complaining of chest pain which was  suggestive of angina pectoris.  Given his ongoing chest discomfort, he is now brought to the cardiac catheterization lab for definite diagnosis of coronary artery disease.  HEMODYNAMIC DATA:  The left ventricular pressure was 106/0 with end diastolic pressure of 9 mmHg.  Aortic pressure was 91/51 with a mean of 57 mmHg.  There was a 10-mm pressure gradient across the aortic valve.  ANGIOGRAPHIC DATA:  Left ventricle:  Left ventricular systolic function was normal with ejection fraction of 60%.  Right coronary artery.  Right coronary artery is a large caliber vessel and a dominant vessel.  Mid segment of the right coronary artery has a 30% stenosis.  Left main:  Left main coronary artery is a large caliber vessel.  Smooth and normal.  LAD:  LAD is a large caliber vessel.  The LAD gives origin to large diagonal 1 which has a mid 40% stenosis.  The LAD itself in the mid to distal segment has a 20% stenosis.  Otherwise, there is  mild luminal irregularity.  Ramus intermediate (high obtuse marginal) is very small and is normal.  Circumflex coronary artery:  Circumflex coronary artery is a moderate caliber vessel.  Smooth with minimal luminal irregularity.  IMPRESSION: 1. Normal left ventricular systolic function, ejection fraction 60%. 2. No significant coronary arteries by cardiac catheterization.  There     is a diagonal 1, which is moderate size vessel with a 40% proximal     mid stenosis, and mid right coronary artery shows a 30% stenosis.  RECOMMENDATION:  Continued aggressive risk modification with lifestyle changes is indicated.  The patient will be discharged home with outpatient followup.  A total of 60 mL of contrast was utilized for diagnostic angiography.  TECHNIQUE OF THE PROCEDURE:  Under sterile precautions using a 6-French right radial access, a 5-French TIG #4 catheter was advanced to the ascending aorta, then to the left ventricle.  Left ventriculography was performed in the RAO projection.  Catheter pulled into the ascending aorta.  Left main was selectively engaged and angiography was performed. Then, the right coronary was selectively engaged and angiography was performed.  Catheter then pulled out of the body.  Intracoronary nitroglycerin was also administered during the procedure.  The catheter pulled out over a J-wire.  The patient tolerated the procedure  well. Hemostasis was obtained by applying TR band.     Pamella Pert, MD     JRG/MEDQ  D:  09/03/2011  T:  09/03/2011  Job:  [id number]  cc:   Marjory Lies, M.D.

## 2011-09-27 ENCOUNTER — Telehealth: Payer: Self-pay | Admitting: Internal Medicine

## 2011-09-27 ENCOUNTER — Other Ambulatory Visit: Payer: Self-pay | Admitting: Internal Medicine

## 2011-09-27 DIAGNOSIS — C8190 Hodgkin lymphoma, unspecified, unspecified site: Secondary | ICD-10-CM

## 2011-09-27 NOTE — Telephone Encounter (Signed)
S/w pt today re appts for lb/pet scan 1/24 and MM 1/29 (mosaiq - 04/30/11 pof).

## 2011-09-27 NOTE — Telephone Encounter (Signed)
Time for 1/24  lb/pet scan changed. lmonvm for pt re new time for 1/24 @ 9:15 am for lb and 9:45 am for pet. S/w pt earlier re appts.

## 2011-10-24 ENCOUNTER — Encounter (HOSPITAL_COMMUNITY): Payer: Self-pay

## 2011-10-24 ENCOUNTER — Other Ambulatory Visit (HOSPITAL_BASED_OUTPATIENT_CLINIC_OR_DEPARTMENT_OTHER): Payer: Medicare Other | Admitting: Lab

## 2011-10-24 ENCOUNTER — Encounter (HOSPITAL_COMMUNITY)
Admission: RE | Admit: 2011-10-24 | Discharge: 2011-10-24 | Disposition: A | Discharge status: Home / Self Care | Payer: Medicare Other | Source: Ambulatory Visit | Attending: Internal Medicine | Admitting: Internal Medicine

## 2011-10-24 DIAGNOSIS — C8190 Hodgkin lymphoma, unspecified, unspecified site: Secondary | ICD-10-CM

## 2011-10-24 DIAGNOSIS — C8589 Other specified types of non-Hodgkin lymphoma, extranodal and solid organ sites: Secondary | ICD-10-CM

## 2011-10-24 LAB — CBC WITH DIFFERENTIAL/PLATELET
BASO%: 0.5 % (ref 0.0–2.0)
Basophils Absolute: 0 10*3/uL (ref 0.0–0.1)
EOS%: 5.9 % (ref 0.0–7.0)
Eosinophils Absolute: 0.1 10*3/uL (ref 0.0–0.5)
HCT: 37.3 % — ABNORMAL LOW (ref 38.4–49.9)
HGB: 12.6 g/dL — ABNORMAL LOW (ref 13.0–17.1)
LYMPH%: 22.7 % (ref 14.0–49.0)
MCH: 27.5 pg (ref 27.2–33.4)
MCHC: 33.8 g/dL (ref 32.0–36.0)
MCV: 81.4 fL (ref 79.3–98.0)
MONO#: 0.2 10*3/uL (ref 0.1–0.9)
MONO%: 9.2 % (ref 0.0–14.0)
NEUT#: 1.5 10*3/uL (ref 1.5–6.5)
NEUT%: 61.7 % (ref 39.0–75.0)
Platelets: 122 10*3/uL — ABNORMAL LOW (ref 140–400)
RBC: 4.59 10*6/uL (ref 4.20–5.82)
RDW: 14.7 % — ABNORMAL HIGH (ref 11.0–14.6)
WBC: 2.5 10*3/uL — ABNORMAL LOW (ref 4.0–10.3)
lymph#: 0.6 10*3/uL — ABNORMAL LOW (ref 0.9–3.3)

## 2011-10-24 LAB — COMPREHENSIVE METABOLIC PANEL
ALT: 14 U/L (ref 0–53)
AST: 16 U/L (ref 0–37)
Albumin: 4.5 g/dL (ref 3.5–5.2)
Alkaline Phosphatase: 71 U/L (ref 39–117)
BUN: 12 mg/dL (ref 6–23)
CO2: 27 mEq/L (ref 19–32)
Calcium: 9.7 mg/dL (ref 8.4–10.5)
Chloride: 100 mEq/L (ref 96–112)
Creatinine, Ser: 1.04 mg/dL (ref 0.50–1.35)
Glucose, Bld: 241 mg/dL — ABNORMAL HIGH (ref 70–99)
Potassium: 4.8 mEq/L (ref 3.5–5.3)
Sodium: 138 mEq/L (ref 135–145)
Total Bilirubin: 0.5 mg/dL (ref 0.3–1.2)
Total Protein: 6.1 g/dL (ref 6.0–8.3)

## 2011-10-24 LAB — GLUCOSE, CAPILLARY: Glucose-Capillary: 227 mg/dL — ABNORMAL HIGH (ref 70–99)

## 2011-10-24 MED ORDER — FLUDEOXYGLUCOSE F - 18 (FDG) INJECTION
19.5000 | Freq: Once | INTRAVENOUS | Status: AC | PRN
  Administered 2011-10-24: 19.5 via INTRAVENOUS

## 2011-10-29 ENCOUNTER — Ambulatory Visit (HOSPITAL_BASED_OUTPATIENT_CLINIC_OR_DEPARTMENT_OTHER): Payer: Medicare Other | Admitting: Internal Medicine

## 2011-10-29 ENCOUNTER — Telehealth: Payer: Self-pay | Admitting: Internal Medicine

## 2011-10-29 DIAGNOSIS — C8190 Hodgkin lymphoma, unspecified, unspecified site: Secondary | ICD-10-CM

## 2011-10-29 NOTE — Progress Notes (Signed)
[facility] OFFICE PROGRESS NOTE  Delorse Lek, MD, MD 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  PRIOR THERAPY:   1. Status post 6 cycles of systemic chemotherapy with ABVD.  Last dose was given January 12, 2009. 2. Status post palliative radiotherapy to the recurrent disease in the neck under the care of Dr. Kathrynn Running, completed January 21, 2011.  CURRENT THERAPY:  Observation.  INTERVAL HISTORY: LENELL LAMA 66 y.o. male returns to the clinic today for six-month followup visit. The patient has no complaints today. He denied having any significant weight loss or night sweats, no palpable lymphadenopathy. No chest pain or shortness of breath. Has no swallowing problem or sore throat The patient has repeat PET scan performed recently and he is here today for evaluation and discussion of his scan results.  MEDICAL HISTORY: Past Medical History  Diagnosis Date  . Cancer   . Diabetes mellitus   . Blockage of coronary artery of heart     ALLERGIES:   has no known allergies.  MEDICATIONS:  Current Outpatient Prescriptions  Medication Sig Dispense Refill  . aspirin 81 MG tablet Take 81 mg by mouth daily.        . carvedilol (COREG) 3.125 MG tablet Take 3.125 mg by mouth 2 (two) times daily with a meal.        . lisinopril (PRINIVIL,ZESTRIL) 10 MG tablet Take 10 mg by mouth daily.        . pioglitazone (ACTOS) 45 MG tablet Take 45 mg by mouth daily.        . rosuvastatin (CRESTOR) 20 MG tablet Take 20 mg by mouth daily.          SURGICAL HISTORY:  Past Surgical History  Procedure Date  . Knee surgery   . Mandible surgery   . Neck surgery     REVIEW OF SYSTEMS:  A comprehensive review of systems was negative.   PHYSICAL EXAMINATION: General appearance: alert, cooperative and no distress Neck: no adenopathy Lymph nodes: Cervical, supraclavicular, and axillary  nodes normal. Resp: clear to auscultation bilaterally Cardio: regular rate and rhythm, S1, S2 normal, no murmur, click, rub or gallop GI: soft, non-tender; bowel sounds normal; no masses,  no organomegaly Extremities: extremities normal, atraumatic, no cyanosis or edema  ECOG PERFORMANCE STATUS: 0 - Asymptomatic  Blood pressure 108/69, pulse 67, temperature 98.4 F (36.9 C), temperature source Oral, height 5' 0.5" (1.537 m), weight 216 lb 6.4 oz (98.158 kg).  LABORATORY DATA: Lab Results  Component Value Date   WBC 2.5* 10/24/2011   HGB 12.6* 10/24/2011   HCT 37.3* 10/24/2011   MCV 81.4 10/24/2011   PLT 122* 10/24/2011      Chemistry      Component Value Date/Time   NA 138 10/24/2011 0935   K 4.8 10/24/2011 0935   CL 100 10/24/2011 0935   CO2 27 10/24/2011 0935   BUN 12 10/24/2011 0935   CREATININE 1.04 10/24/2011 0935      Component Value Date/Time   CALCIUM 9.7 10/24/2011 0935   ALKPHOS 71 10/24/2011 0935   AST 16 10/24/2011 0935   ALT 14 10/24/2011 0935   BILITOT 0.5 10/24/2011 0935       RADIOGRAPHIC STUDIES: Nm Pet Image Restag (ps) Skull Base To Thigh  10/24/2011  *RADIOLOGY REPORT*  Clinical Data: Subsequent treatment strategy for lymphoma.  NUCLEAR MEDICINE PET  CT RESTAGING (PS) SKULL BASE TO THIGH  Technique:  19.5 mCi F-18 FDG was injected intravenously via the left AC.  Full-ring PET imaging was performed from the skull base through the mid-thighs 68  minutes after injection.  CT data was obtained and used for attenuation correction and anatomic localization only.  (This was not acquired as a diagnostic CT examination.)  Fasting Blood Glucose:  227  Patient Weight:  210 pounds.  Comparison: Prior PET CT 04/12/2011  Findings: Skull Base and Neck:  There is slight asymmetric thickening of the soft tissues of the oropharynx on the left, with some corresponding asymmetric increased metabolic activity on the PET portion of the examination (SUVmax = 4.0)which is nonspecific.  Thorax:   No abnormal hypermetabolic activity. Lungs are clear. No suspicious appearing pulmonary nodules or masses. No abnormal soft tissue masses. No mediastinal or hilar adenopathy. Heart size is normal.There is atherosclerosis of the thoracic aorta, the great vessels of the mediastinum and the coronary arteries, including calcified atherosclerotic plaque in the left main, LAD, circumflex and right coronary arteries. Circumferential mild soft tissue thickening of the entire esophagus.  Minimal subsegmental atelectasis in the lower lobes of the lungs bilaterally.  Abdomen/Pelvis:  No abnormal hypermetabolic activity. The uninfused appearance of the liver, gallbladder, pancreas, spleen, bilateral adrenal glands and bilateral kidneys is unremarkable. No abnormal soft tissue masses or lymphadenopathy. No ascites or pneumoperitoneum, and no pathologic distension of bowel.  Upper Thighs:  No abnormal hypermetabolic activity. No abnormal soft tissue masses or lymphadenopathy.  IMPRESSION: 1.  No findings to suggest recurrence of disease on today's scan. 2.  There is a very slight asymmetric soft tissue thickening in the soft tissues of the oropharynx on the left side, with corresponding asymmetrically increased metabolic activity that is low level (SUVmax = 4.0), which is nonspecific.  This could be physiologic or reactive, but direct inspection of this region may be warranted. 3. Atherosclerosis, including the left main and two-vessel coronary artery disease. Please note that although the presence of coronary artery calcium documents the presence of coronary artery disease, the severity of this disease and any potential stenosis cannot be assessed on this non-gated CT examination.  Assessment for potential risk factor modification, dietary therapy or pharmacologic therapy may be warranted, if clinically indicated. 4.  Mild circumferential thickening of the entire esophagus.  This may suggest reflux esophagitis.  Original Report  Authenticated By: Florencia Reasons, M.D.    ASSESSMENT: This is a very pleasant 66 years old white male with recurrent Hodgkin's lymphoma treated with his curative radiotherapy. He is currently on observation with no evidence for disease recurrence. The patient has soft tissue thickening in the soft tissue of the oropharynx on the left side was correspondingly asymmetric increased metabolic activity. I discussed the scan results with the patient today  PLAN: I recommended for him continuous observation for the Hodgkin's lymphoma with repeat CT scan of the neck, chest, abdomen and pelvis in 6 months. I asked the patient will contact Dr. Lubertha Basque for evaluation of the questionable oral pharyngeal lesion.  All questions were answered. The patient knows to call the clinic with any problems, questions or concerns. We can certainly see the patient much sooner if necessary.

## 2011-10-29 NOTE — Telephone Encounter (Signed)
Gv pt appt for july2013. scheduled pt for ct scan on 07/29 @ [facility]  °

## 2012-04-27 ENCOUNTER — Encounter (HOSPITAL_COMMUNITY): Payer: Self-pay

## 2012-04-27 ENCOUNTER — Ambulatory Visit (HOSPITAL_COMMUNITY)
Admission: RE | Admit: 2012-04-27 | Discharge: 2012-04-27 | Disposition: A | Discharge status: Home / Self Care | Payer: Medicare Other | Source: Ambulatory Visit | Attending: Internal Medicine | Admitting: Internal Medicine

## 2012-04-27 ENCOUNTER — Other Ambulatory Visit (HOSPITAL_BASED_OUTPATIENT_CLINIC_OR_DEPARTMENT_OTHER): Payer: Medicare Other | Admitting: Lab

## 2012-04-27 DIAGNOSIS — E119 Type 2 diabetes mellitus without complications: Secondary | ICD-10-CM

## 2012-04-27 DIAGNOSIS — J438 Other emphysema: Secondary | ICD-10-CM | POA: Insufficient documentation

## 2012-04-27 DIAGNOSIS — Z981 Arthrodesis status: Secondary | ICD-10-CM | POA: Insufficient documentation

## 2012-04-27 DIAGNOSIS — K5730 Diverticulosis of large intestine without perforation or abscess without bleeding: Secondary | ICD-10-CM | POA: Insufficient documentation

## 2012-04-27 DIAGNOSIS — I700 Atherosclerosis of aorta: Secondary | ICD-10-CM | POA: Insufficient documentation

## 2012-04-27 DIAGNOSIS — C8190 Hodgkin lymphoma, unspecified, unspecified site: Secondary | ICD-10-CM | POA: Insufficient documentation

## 2012-04-27 DIAGNOSIS — I10 Essential (primary) hypertension: Secondary | ICD-10-CM

## 2012-04-27 DIAGNOSIS — I2510 Atherosclerotic heart disease of native coronary artery without angina pectoris: Secondary | ICD-10-CM | POA: Insufficient documentation

## 2012-04-27 HISTORY — DX: Essential (primary) hypertension: I10

## 2012-04-27 LAB — CBC WITH DIFFERENTIAL/PLATELET
BASO%: 0.4 % (ref 0.0–2.0)
Basophils Absolute: 0 10*3/uL (ref 0.0–0.1)
EOS%: 4.4 % (ref 0.0–7.0)
Eosinophils Absolute: 0.1 10*3/uL (ref 0.0–0.5)
HCT: 36.8 % — ABNORMAL LOW (ref 38.4–49.9)
HGB: 11.9 g/dL — ABNORMAL LOW (ref 13.0–17.1)
LYMPH%: 26.9 % (ref 14.0–49.0)
MCH: 26.6 pg — ABNORMAL LOW (ref 27.2–33.4)
MCHC: 32.3 g/dL (ref 32.0–36.0)
MCV: 82.5 fL (ref 79.3–98.0)
MONO#: 0.2 10*3/uL (ref 0.1–0.9)
MONO%: 9.9 % (ref 0.0–14.0)
NEUT#: 1.5 10*3/uL (ref 1.5–6.5)
NEUT%: 58.4 % (ref 39.0–75.0)
Platelets: 116 10*3/uL — ABNORMAL LOW (ref 140–400)
RBC: 4.47 10*6/uL (ref 4.20–5.82)
RDW: 14.4 % (ref 11.0–14.6)
WBC: 2.5 10*3/uL — ABNORMAL LOW (ref 4.0–10.3)
lymph#: 0.7 10*3/uL — ABNORMAL LOW (ref 0.9–3.3)

## 2012-04-27 LAB — CMP (CANCER CENTER ONLY)
ALT(SGPT): 21 U/L (ref 10–47)
AST: 19 U/L (ref 11–38)
Albumin: 3.5 g/dL (ref 3.3–5.5)
Alkaline Phosphatase: 78 U/L (ref 26–84)
BUN, Bld: 18 mg/dL (ref 7–22)
CO2: 28 mEq/L (ref 18–33)
Calcium: 9.7 mg/dL (ref 8.0–10.3)
Chloride: 98 mEq/L (ref 98–108)
Creat: 0.9 mg/dl (ref 0.6–1.2)
Glucose, Bld: 208 mg/dL — ABNORMAL HIGH (ref 73–118)
Potassium: 4.4 mEq/L (ref 3.3–4.7)
Sodium: 143 mEq/L (ref 128–145)
Total Bilirubin: 0.7 mg/dl (ref 0.20–1.60)
Total Protein: 6.6 g/dL (ref 6.4–8.1)

## 2012-04-27 MED ORDER — IOHEXOL 300 MG/ML  SOLN
125.0000 mL | Freq: Once | INTRAMUSCULAR | Status: AC | PRN
  Administered 2012-04-27: 125 mL via INTRAVENOUS

## 2012-04-29 ENCOUNTER — Telehealth: Payer: Self-pay | Admitting: Internal Medicine

## 2012-04-29 ENCOUNTER — Ambulatory Visit (HOSPITAL_BASED_OUTPATIENT_CLINIC_OR_DEPARTMENT_OTHER): Payer: Medicare Other | Admitting: Internal Medicine

## 2012-04-29 VITALS — BP 106/62 | HR 71 | Temp 97.9°F | Wt 224.6 lb

## 2012-04-29 DIAGNOSIS — C8190 Hodgkin lymphoma, unspecified, unspecified site: Secondary | ICD-10-CM | POA: Insufficient documentation

## 2012-04-29 NOTE — Progress Notes (Signed)
[facility] Telephone:([phone]) [phone]   Fax:([phone]  OFFICE PROGRESS NOTE  BURNETT,BRENT A, MD 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   PRIOR THERAPY:  1. Status post 6 cycles of systemic chemotherapy with ABVD. Last dose was given January 12, 2009. 2. Status post palliative radiotherapy to the recurrent disease in the neck under the care of Dr. Kathrynn Running, completed January 21, 2011.  CURRENT THERAPY: Observation.    INTERVAL HISTORY: Cody KLOOS 66 y.o. male returns to the clinic today for routine six-month followup visit. The patient is doing fine today with no specific complaints except for generalized fatigue. He denied having any significant weight loss or night sweats. He denied having any chest pain or shortness breath, no cough or hemoptysis. He has no palpable lymphadenopathy. He denied having any swallowing issues or hoarseness of his voice. He has repeat CT scan of the neck, chest, abdomen and pelvis performed recently and he is here today for evaluation and discussion of his scan results.  MEDICAL HISTORY: Past Medical History  Diagnosis Date  . Diabetes mellitus   . Blockage of coronary artery of heart   . hodgkins lymphoma dx'd 06/2008  . Hypertension     ALLERGIES:   has no known allergies.  MEDICATIONS:  Current Outpatient Prescriptions  Medication Sig Dispense Refill  . aspirin 81 MG tablet Take 81 mg by mouth daily.        . carvedilol (COREG) 3.125 MG tablet Take 3.125 mg by mouth 2 (two) times daily with a meal.        . lisinopril (PRINIVIL,ZESTRIL) 10 MG tablet Take 10 mg by mouth daily.        . pioglitazone (ACTOS) 45 MG tablet Take 45 mg by mouth daily.        . rosuvastatin (CRESTOR) 20 MG tablet Take 20 mg by mouth daily.          SURGICAL HISTORY:  Past Surgical History  Procedure Date  . Knee surgery   . Mandible surgery     . Neck surgery     REVIEW OF SYSTEMS:  A comprehensive review of systems was negative except for: Constitutional: positive for fatigue   PHYSICAL EXAMINATION: General appearance: alert, cooperative and no distress Neck: no adenopathy Lymph nodes: Cervical, supraclavicular, and axillary nodes normal. Resp: clear to auscultation bilaterally Cardio: regular rate and rhythm, S1, S2 normal, no murmur, click, rub or gallop GI: soft, non-tender; bowel sounds normal; no masses,  no organomegaly Extremities: extremities normal, atraumatic, no cyanosis or edema  ECOG PERFORMANCE STATUS: 1 - Symptomatic but completely ambulatory  There were no vitals taken for this visit.  LABORATORY DATA: Lab Results  Component Value Date   WBC 2.5* 04/27/2012   HGB 11.9* 04/27/2012   HCT 36.8* 04/27/2012   MCV 82.5 04/27/2012   PLT 116* 04/27/2012      Chemistry      Component Value Date/Time   NA 143 04/27/2012 1029   NA 138 10/24/2011 0935   K 4.4 04/27/2012 1029   K 4.8 10/24/2011 0935   CL 98 04/27/2012 1029   CL 100 10/24/2011 0935   CO2 28 04/27/2012 1029   CO2 27 10/24/2011 0935   BUN 18 04/27/2012 1029   BUN 12 10/24/2011 0935   CREATININE 0.9 04/27/2012 1029   CREATININE 1.04 10/24/2011 0935      Component Value  Date/Time   CALCIUM 9.7 04/27/2012 1029   CALCIUM 9.7 10/24/2011 0935   ALKPHOS 78 04/27/2012 1029   ALKPHOS 71 10/24/2011 0935   AST 19 04/27/2012 1029   AST 16 10/24/2011 0935   ALT 14 10/24/2011 0935   BILITOT 0.70 04/27/2012 1029   BILITOT 0.5 10/24/2011 0935       RADIOGRAPHIC STUDIES: Ct Soft Tissue Neck W Contrast  04/27/2012  *RADIOLOGY REPORT*  Clinical Data: Hodgkin's lymphoma  CT NECK WITH CONTRAST  Technique:  Multidetector CT imaging of the neck was performed with intravenous contrast.  Contrast: OMNIPAQUE IOHEXOL 300 MG/ML  SOLN  Comparison: PET scan 10/24/2011.  Findings: No significant mucosal or submucosal lesion is evident to.  There is some asymmetry of posterior  oral pharyngeal tissue on the left.  This is relatively stable and should be amendable to direct visualization.  Sub centimeter bilateral posterior level II and level III lymph nodes are similar to the prior studies.  No new or enlarging lymph nodes are present.  The patient is status post fusion of the cervical spine C3-C7.  The fusion is stable.  Mild osseous foraminal narrowing is present at to C5-6 and C6-7 bilaterally.  The soft tissues are unremarkable. Mild emphysematous changes are noted at the lung apices.  IMPRESSION:  1.  Mild asymmetry of the posterior left oral pharyngeal soft tissues is stable.  The area should be amendable to direct visualization. 2.  Stable appearance of sub centimeter posterior right level II level III lymph nodes without evidence for residual or recurrent lymphoma in the neck. 3.  Postop changes of the cervical spine. 4. Emphysema.  Original Report Authenticated By: Jamesetta Orleans. MATTERN, M.D.   Ct Chest W Contrast  04/27/2012  *RADIOLOGY REPORT*  Clinical Data:  Hodgkin's disease.  Restaging scan.  CT CHEST, ABDOMEN AND PELVIS WITH CONTRAST  Technique:  Multidetector CT imaging of the chest, abdomen and pelvis was performed following the standard protocol during bolus administration of intravenous contrast.  Contrast: OMNIPAQUE IOHEXOL 300 MG/ML  SOLN  Comparison:  Multiple priors, most recently PET CT 10/24/2011.   CT CHEST  Findings:  Mediastinum: Heart size is normal. There is no significant pericardial fluid, thickening or pericardial calcification. There is atherosclerosis of the thoracic aorta, the great vessels of the mediastinum and the coronary arteries, including calcified atherosclerotic plaque in the left main, left anterior descending, left circumflex and right coronary arteries. No pathologically enlarged mediastinal or hilar lymph nodes. Esophagus is unremarkable in appearance.  Lungs/Pleura: No suspicious appearing pulmonary nodules or masses. No  consolidative airspace disease.  No pleural effusions.  Musculoskeletal: There are no aggressive appearing lytic or blastic lesions noted in the visualized portions of the skeleton.  IMPRESSION:  1.  No findings to suggest disease recurrence within the thorax. 2. Atherosclerosis, including left main and three-vessel coronary artery disease. Assessment for potential risk factor modification, dietary therapy or pharmacologic therapy may be warranted, if clinically indicated.  CT ABDOMEN AND PELVIS  Findings:  Abdomen/Pelvis: The enhanced appearance of the liver, gallbladder, pancreas, spleen, bilateral adrenal glands and bilateral kidneys is unremarkable.  There are no abnormal soft tissue masses, and no definite pathologically enlarged lymph nodes within the abdomen or pelvis.  No ascites or pneumoperitoneum and no pathologic distension of bowel.  Prostate and urinary bladder are unremarkable in appearance.  Atherosclerosis of the abdominal and pelvic vasculature, without definite aneurysm or dissection.  There are a few scattered colonic diverticula, without surrounding inflammatory changes  to suggest acute diverticulitis.  Musculoskeletal: There are no aggressive appearing lytic or blastic lesions noted in the visualized portions of the skeleton.  IMPRESSION:  1.  No findings to suggest disease recurrence within the abdomen or pelvis. 2.  Very mild colonic diverticulosis without evidence to suggest acute diverticulitis. 3.  Extensive atherosclerosis.  Original Report Authenticated By: Florencia Reasons, M.D.    ASSESSMENT: This is a very pleasant 66 years old white male with recurrent Hodgkin's lymphoma status post systemic chemotherapy as well as palliative radiation to the recurrent disease and has been observation since April 2012 with no evidence for disease progression.  PLAN: I discussed the scan results with the patient. I recommended for him continuous observation with repeat CT scan of the neck,  chest, abdomen and pelvis in 6 months.  The patient would come back for followup visit at that time. He was advised to call me immediately if he has any concerning symptoms in the interval. All questions were answered. The patient knows to call the clinic with any problems, questions or concerns. We can certainly see the patient much sooner if necessary.

## 2012-04-29 NOTE — Telephone Encounter (Signed)
appts made and printed for pt aom °

## 2012-05-05 ENCOUNTER — Ambulatory Visit (HOSPITAL_COMMUNITY)
Admission: RE | Admit: 2012-05-05 | Discharge: 2012-05-05 | Disposition: A | Discharge status: Home / Self Care | Payer: Medicare Other | Source: Ambulatory Visit | Attending: Family Medicine | Admitting: Family Medicine

## 2012-05-05 DIAGNOSIS — M545 Low back pain, unspecified: Secondary | ICD-10-CM | POA: Insufficient documentation

## 2012-05-05 DIAGNOSIS — R29898 Other symptoms and signs involving the musculoskeletal system: Secondary | ICD-10-CM | POA: Insufficient documentation

## 2012-05-05 DIAGNOSIS — M6281 Muscle weakness (generalized): Secondary | ICD-10-CM | POA: Insufficient documentation

## 2012-05-05 DIAGNOSIS — Z9181 History of falling: Secondary | ICD-10-CM | POA: Insufficient documentation

## 2012-05-05 DIAGNOSIS — M25569 Pain in unspecified knee: Secondary | ICD-10-CM | POA: Insufficient documentation

## 2012-05-05 DIAGNOSIS — IMO0001 Reserved for inherently not codable concepts without codable children: Secondary | ICD-10-CM | POA: Insufficient documentation

## 2012-05-05 DIAGNOSIS — Z9119 Patient's noncompliance with other medical treatment and regimen: Secondary | ICD-10-CM | POA: Insufficient documentation

## 2012-05-05 DIAGNOSIS — Z91199 Patient's noncompliance with other medical treatment and regimen due to unspecified reason: Secondary | ICD-10-CM | POA: Insufficient documentation

## 2012-05-05 DIAGNOSIS — M546 Pain in thoracic spine: Secondary | ICD-10-CM | POA: Insufficient documentation

## 2012-05-05 DIAGNOSIS — R269 Unspecified abnormalities of gait and mobility: Secondary | ICD-10-CM | POA: Insufficient documentation

## 2012-05-05 NOTE — Evaluation (Signed)
Physical Therapy Evaluation  Patient Details  Name: Cody Wood MRN: [id number] Date of Birth: [birth year]  Today's Date: 05/05/2012 Time: 1310-1358 PT Time Calculation (min): 48 min  Visit#: 1  of 12   Re-eval: 06/04/12 Assessment Diagnosis: unsteady gait. Prior Therapy: none  Authorization: medicare  Authorization Time Period:    Authorization Visit#: 1  of 10    Past Medical History:  Past Medical History  Diagnosis Date  . Diabetes mellitus   . Blockage of coronary artery of heart   . hodgkins lymphoma dx'd 06/2008  . Hypertension    Past Surgical History:  Past Surgical History  Procedure Date  . Knee surgery   . Mandible surgery   . Neck surgery     Subjective Symptoms/Limitations Symptoms: Pt states that he has been stumbling down the hallway in his house.  He also states that he is losing his balance getting in and out of his tub at home.  The patient states that he falls a couple times a month.   Pertinent History: cervical surgery 2003; hx of cancer. How long can you stand comfortably?: The patient states that he can stand for 15 minutes without stopping. How long can you walk comfortably?: The patient states that the longest he has walked for 10 minutes. Patient Stated Goals: I want to stop falling Pain Assessment Currently in Pain?: Yes Pain Score:   4 Pain Location: Knee Pain Orientation: Right;Left Pain Type: Chronic pain Pain Relieving Factors: states alleve and advil does not assist in his pain.  Precautions/Restrictions  Precautions Precautions: Fall  Prior Functioning  Home Living Lives With: Family Type of Home: House Home Access: Stairs to enter [employer] of Steps: 2 (unable to complete reciprocally.) Entrance Stairs-Rails: None Prior Function Level of Independence: Independent with basic ADLs Able to Take Stairs?: Reciprically Vocation: On disability Leisure: Hobbies-yes (Comment) Comments: watch  movies.  Cognition/Observation Cognition Overall Cognitive Status: Appears within functional limits for tasks assessed  Sensation/Coordination/Flexibility/Functional Tests Functional Tests Functional Tests: ABC's 25% Functional Tests: sit to stand 5x in 30 seconds. Normal is at 8.4 secounds Functional Tests: TUG 18 above 14.7 is a fall risk Assessment RLE Strength Right Hip Flexion: 3-/5 Right Hip Extension: 3-/5 Right Hip ABduction: 3+/5 Right Hip ADduction: 3/5 Right Knee Flexion: 3+/5 Right Knee Extension: 3+/5 Right Ankle Dorsiflexion: 3+/5 LLE Strength Left Hip Flexion: 3+/5 Left Hip Extension: 3-/5 Left Hip ABduction: 5/5 Left Hip ADduction: 5/5 Left Knee Flexion: 4/5 Left Knee Extension: 3+/5 Left Ankle Dorsiflexion: 3+/5  Exercise/Treatments Mobility/Balance  Posture/Postural Control Posture/Postural Control: Postural limitations- increased kyphosis;  Berg Balance Test Total Score: 48/56     Exercises: Seated Long Arc Quad: 5 reps Other Seated Knee Exercises: heelraise/toeraise x 5 Supine Bridges: 5 reps Straight Leg Raises: Left;5 reps;Limitations Straight Leg Raises Limitations: R modified with knee bent. Sidelying Hip ABduction: Right;5 reps Prone  Hamstring Curl: 5 reps    Physical Therapy Assessment and Plan PT Assessment and Plan Clinical Impression Statement: Pt with B leg weakness R greater than L and balance disorder causing falling.  Pt will benefit from skilled therapy to improve balance and strength to increase activity as well as improve safety. Pt will benefit from skilled therapeutic intervention in order to improve on the following deficits: Decreased activity tolerance;Decreased strength;Decreased balance;Other (comment) (personal hx of falling) Rehab Potential: Good PT Frequency: Min 3X/week PT Duration: 4 weeks PT Treatment/Interventions: Therapeutic activities;Therapeutic exercise;Balance training;Patient/family education PT Plan:  encourage pt to start a walking program even  if that begins with 5 minutes three times a day in his own home;  next treatment begin bike, heel raise, functional squat , SLS; tandem gt; retro gt; sidestep with t-band; cone rotation.    Goals Home Exercise Program Pt will Perform Home Exercise Program: Independently PT Short Term Goals Time to Complete Short Term Goals: 2 weeks PT Short Term Goal 1: Pt to have not fallen in the past week. PT Short Term Goal 2: Pt able to come sit to stand with ease PT Short Term Goal 3: Pt to be walking at least 10 minutes a day. PT Long Term Goals Time to Complete Long Term Goals: 4 weeks PT Long Term Goal 1: Pt to be I in advance HEP PT Long Term Goal 2: Pt ABC balance confidence to be improved to at least a 60 Long Term Goal 3: Pt to have no falls in the past three weeks Long Term Goal 4: mm strength increased by one grade PT Long Term Goal 5: sit to stand in 12 seconds (high end of normal) Additional PT Long Term Goals?: Yes PT Long Term Goal 6: Berg balance to be improved by 6 levels. PT Long Term Goal 7: TUG to be at 14 or less  Problem List Patient Active Problem List  Diagnosis  . Chest pain on exertion  . Hodgkin's lymphoma  . Personal history of falling, presenting hazards to health  . Bilateral leg weakness    PT - End of Session Equipment Utilized During Treatment: Gait belt Activity Tolerance: Patient tolerated treatment well PT Plan of Care PT Home Exercise Plan: given for strengthening Consulted and Agree with Plan of Care: Patient  GP Functional Limitation: Mobility: Walking and moving around Mobility: Walking and Moving Around Current Status ([id number]): At least 60 percent but less than 80 percent impaired, limited or restricted Mobility: Walking and Moving Around Goal Status [phone]): At least 1 percent but less than 20 percent impaired, limited or restricted  RUSSELL,CINDY 05/05/2012, 2:22 PM  Physician Documentation Your  signature is required to indicate approval of the treatment plan as stated above.  Please sign and either send electronically or make a copy of this report for your files and return this physician signed original.   Please mark one 1.__approve of plan  2. ___approve of plan with the following conditions.   ______________________________                                                          _____________________ Physician Signature                                                                                                             Date

## 2012-05-07 ENCOUNTER — Inpatient Hospital Stay (HOSPITAL_COMMUNITY): Admission: RE | Admit: 2012-05-07 | Payer: Medicare Other | Source: Ambulatory Visit | Admitting: Physical Therapy

## 2012-05-11 ENCOUNTER — Ambulatory Visit (HOSPITAL_COMMUNITY)
Admission: RE | Admit: 2012-05-11 | Discharge: 2012-05-11 | Disposition: A | Discharge status: Home / Self Care | Payer: Medicare Other | Source: Ambulatory Visit | Attending: Family Medicine | Admitting: Family Medicine

## 2012-05-11 DIAGNOSIS — Z9181 History of falling: Secondary | ICD-10-CM

## 2012-05-11 DIAGNOSIS — R29898 Other symptoms and signs involving the musculoskeletal system: Secondary | ICD-10-CM

## 2012-05-11 NOTE — Progress Notes (Signed)
Physical Therapy Treatment Patient Details  Name: MITSUGI SCHRADER MRN: [id number] Date of Birth: 1946-04-28  Today's Date: 05/11/2012 Time: 4696-2952 PT Time Calculation (min): 43 min  Visit#: 2  of 12   Re-eval: 06/04/12   Authorization Visit#: 2  of 10 Medicare.   Subjective: Symptoms/Limitations Symptoms: Pt states he has been doing the exercises given to him.  States he has been trying to walk at least five minutes each day.   Exercise/Treatments     Balance Exercises Standing Tandem Stance: Eyes closed;2 reps;30 secs Balance Beam:  (tandem gt; retro gt x 2 rt) Sidestepping: 2 reps;Theraband Theraband Level (Sidestepping): Level 4 (Blue) Numbers 1-15: Foam;1 rep Heel Raises: 10 reps Sit to Stand: Standard surface;Limitations Sit to Stand Limitations: 5 reps R then L Other Standing Exercises: minis squat x 10 rep  Other Standing Exercises: rockerboard A/P and R/L x 1'  Yoga Poses    Seated Other Seated Exercises: bike 6' @ 1.0       Physical Therapy Assessment and Plan PT Assessment and Plan Clinical Impression Statement: Pt completed all ex with SBA.  Pt R LE showing signs of fatigue at end of treatment. Rehab Potential: Good PT Frequency: Min 3X/week PT Duration: 4 weeks PT Plan: Begin SLS and cone rotation next treatment.      Problem List Patient Active Problem List  Diagnosis  . Chest pain on exertion  . Hodgkin's lymphoma  . Personal history of falling, presenting hazards to health  . Bilateral leg weakness    PT - End of Session Equipment Utilized During Treatment: Gait belt Activity Tolerance: Patient tolerated treatment well General Behavior During Session: [facility] for tasks performed Cognition: [facility] for tasks performed PT Plan of Care Consulted and Agree with Plan of Care: Patient     RUSSELL,CINDY 05/11/2012, 2:29 PM

## 2012-05-13 ENCOUNTER — Ambulatory Visit (HOSPITAL_COMMUNITY)
Admission: RE | Admit: 2012-05-13 | Discharge: 2012-05-13 | Disposition: A | Discharge status: Home / Self Care | Payer: Medicare Other | Source: Ambulatory Visit | Attending: Family Medicine | Admitting: Family Medicine

## 2012-05-13 NOTE — Progress Notes (Signed)
Physical Therapy Treatment Patient Details  Name: Cody Wood MRN: [id number] Date of Birth: 1946/01/20  Today's Date: 05/13/2012 Time: 4098-1191 PT Time Calculation (min): 51 min  Visit#: 3  of 12   Re-eval: 06/04/12 Charges: Therex x 10' NMR x 30'   Authorization Visit#: 3  of 10    Subjective: Symptoms/Limitations Symptoms: Pt reports continued HEP compliance. Pain Assessment Currently in Pain?: Yes Pain Score:   6 Pain Location: Back Pain Orientation: Mid;Lower   Exercise/Treatments Standing Functional Squat: 10 reps;3 seconds Rocker Board: 2 minutes;Limitations Rocker Board Limitations: A/L A/P Standing Tandem Stance: Eyes open;2 reps;30 secs SLS: Limitations SLS Limitations: L:11" R:4" max of 4 Numbers 1-15: Foam;1 rep Cone Rotation: Foam;Right turn;Left turn Heel Raises: 10 reps Toe Raise: 10 reps Sit to Stand: Standard surface;Limitations Sit to Stand Limitations: 5 reps R then L Lift / Chop:  (No UE assist)   Physical Therapy Assessment and Plan PT Assessment and Plan Clinical Impression Statement: Pt presents with multiple LOB during NMR exercises. Pt tends to fall backward with LOB requiring min-mod assist to recover. Pt requires 3 rest breaks during session secondary to fatigue. Pt reports pain decrease to 2/10 at end of session. PT Plan: Continue to progress per PT POC. Begin wall bumps next session.     Problem List Patient Active Problem List  Diagnosis  . Chest pain on exertion  . Hodgkin's lymphoma  . Personal history of falling, presenting hazards to health  . Bilateral leg weakness    PT - End of Session Equipment Utilized During Treatment: Gait belt Activity Tolerance: Patient tolerated treatment well General Behavior During Session: [facility] for tasks performed Cognition: [facility] for tasks performed  [name], PTA 05/13/2012, 3:52 PM

## 2012-05-14 ENCOUNTER — Ambulatory Visit (HOSPITAL_COMMUNITY)
Admission: RE | Admit: 2012-05-14 | Discharge: 2012-05-14 | Disposition: A | Discharge status: Home / Self Care | Payer: Medicare Other | Source: Ambulatory Visit | Attending: Family Medicine | Admitting: Family Medicine

## 2012-05-14 NOTE — Progress Notes (Signed)
Physical Therapy Treatment Patient Details  Name: Cody Wood MRN: [id number] Date of Birth: [birth year]  Today's Date: 05/14/2012 Time: 1521-1600 PT Time Calculation (min): 39 min  Visit#: 4  of 12   Re-eval: 06/04/12 Charges: Therex x 8' NMR x 30'  Authorization:    Authorization Time Period:    Authorization Visit#: 4  of 10    Subjective: Symptoms/Limitations Symptoms: I was pretty sore after last session but it didn't last long. Pain Assessment Currently in Pain?: Yes Pain Score:   5 Pain Location: Back Pain Orientation: Mid;Lower   Exercise/Treatments Standing Functional Squat: 15 reps Rocker Board: 2 minutes;Limitations Rocker Board Limitations: A/L A/P Balance Exercises Standing Tandem Stance: Foam;2 reps;Limitations Tandem Stance Limitations: 1' SLS: Limitations SLS Limitations: L:11" R:5" max of 4 Wall Bumps: 10 reps;Hips;Shoulder Numbers 1-15: Foam;1 rep Cone Rotation: Foam;Right turn;Left turn Heel Raises: 15 reps Toe Raise: 15 reps Sit to Stand: Standard surface;Limitations Sit to Stand Limitations: 10 reps R then L; no UE assist   Physical Therapy Assessment and Plan PT Assessment and Plan Clinical Impression Statement: Pt appears more easily fatigued this session. This may be due to having back to back therapy appointments. Began wall bumps with minimal difficulty after demo and cueing for technique. Pt reports pain decrease to 2/10 at end of session. PT Plan: Continue to progress per PT POC.      Problem List Patient Active Problem List  Diagnosis  . Chest pain on exertion  . Hodgkin's lymphoma  . Personal history of falling, presenting hazards to health  . Bilateral leg weakness    PT - End of Session Equipment Utilized During Treatment: Gait belt Activity Tolerance: Patient tolerated treatment well General Behavior During Session: [facility] for tasks performed Cognition: [facility] for tasks performed  [name], PTA 05/14/2012, 5:09  PM

## 2012-05-18 ENCOUNTER — Ambulatory Visit (HOSPITAL_COMMUNITY)
Admission: RE | Admit: 2012-05-18 | Discharge: 2012-05-18 | Disposition: A | Discharge status: Home / Self Care | Payer: Medicare Other | Source: Ambulatory Visit | Attending: Family Medicine | Admitting: Family Medicine

## 2012-05-18 NOTE — Progress Notes (Signed)
Physical Therapy Treatment Patient Details  Name: Cody Wood MRN: [id number] Date of Birth: 1946-01-10  Today's Date: 05/18/2012 Time: 1478-2956 PT Time Calculation (min): 38 min Visit#: 5  of 12   Re-eval: 06/04/12 Charges:  NMR 38'    Authorization: Medicare  Authorization Visit#: 5  of 10    Subjective: Symptoms/Limitations Symptoms: Pt. states he was sore again after last session but feeling pretty good today. Pain Assessment Currently in Pain?: No/denies   Exercise/Treatments Balance Exercises Standing SLS: Limitations SLS Limitations: L:16" R:5" max of 4 Balance Beam: 2RT each, forward and side stepping Tandem Gait: 2 reps Sidestepping: 2 reps;Theraband Theraband Level (Sidestepping): Level 4 (Blue) Numbers 1-15: Foam;1 rep (Right UE) Heel Raises: 15 reps Toe Raise: 15 reps Sit to Stand: Standard surface;Limitations Sit to Stand Limitations: 10 reps R then L; no UE assist Other Standing Exercises: minis squat x 15 rep  Other Standing Exercises: rockerboard A/P and R/L x 2' each    Physical Therapy Assessment and Plan PT Assessment and Plan Clinical Impression Statement: Less fatigue noted today with only 2 short sitting breaks during session.  Added tandem gait and gait on balance beam.  Side stepping more difficult/tiring than tandem.   PT Plan: Continue to progress balance.     Problem List Patient Active Problem List  Diagnosis  . Chest pain on exertion  . Hodgkin's lymphoma  . Personal history of falling, presenting hazards to health  . Bilateral leg weakness    PT - End of Session Activity Tolerance: Patient tolerated treatment well General Behavior During Session: [facility] for tasks performed Cognition: [facility] for tasks performed  Lurena Nida, PTA/CLT 05/18/2012, 2:30 PM

## 2012-05-20 ENCOUNTER — Ambulatory Visit (HOSPITAL_COMMUNITY)
Admission: RE | Admit: 2012-05-20 | Discharge: 2012-05-20 | Disposition: A | Discharge status: Home / Self Care | Payer: Medicare Other | Source: Ambulatory Visit | Attending: Family Medicine | Admitting: Family Medicine

## 2012-05-20 NOTE — Progress Notes (Signed)
Physical Therapy Treatment Patient Details  Name: FRANCIS DOENGES MRN: [id number] Date of Birth: Apr 09, 1946  Today's Date: 05/20/2012 Time: 7829-5621 PT Time Calculation (min): 42 min  Visit#: 6  of 12   Re-eval: 06/04/12 Charges: NMR x 38'  Authorization: Medicare  Authorization Visit#: 6  of 10    Subjective: Symptoms/Limitations Symptoms: Pt states that he feels his activity tolerance is improving. Pain Assessment Currently in Pain?: No/denies   Exercise/Treatments Aerobic Elliptical: 3' L1 with CGA and tactile cueing to avoid R knee locking Balance Exercises Standing SLS: Limitations SLS Limitations: L:16" R:4" max of 4 Tandem Gait: 2 reps Sidestepping: 2 reps;Theraband Theraband Level (Sidestepping): Level 4 (Blue) Numbers 1-15: Foam;1 rep Other Standing Exercises: Heel/toe walk 2 RT each Other Standing Exercises: rockerboard A/P and R/L x 2' each   Physical Therapy Assessment and Plan PT Assessment and Plan Clinical Impression Statement: Pt presents with improved activity tolerance this session. Pt also presents with decreased LOB with NMR exercises. Began elliptical with tactile cueing to avoid locking R knee and CGA. Pt appears to be progressing well. PT Plan: Continue to progress balance per PT POC.     Problem List Patient Active Problem List  Diagnosis  . Chest pain on exertion  . Hodgkin's lymphoma  . Personal history of falling, presenting hazards to health  . Bilateral leg weakness    PT - End of Session Activity Tolerance: Patient tolerated treatment well General Behavior During Session: [facility] for tasks performed Cognition: [facility] for tasks performed   [name], PTA 05/20/2012, 4:09 PM

## 2012-05-22 ENCOUNTER — Ambulatory Visit (HOSPITAL_COMMUNITY)
Admission: RE | Admit: 2012-05-22 | Discharge: 2012-05-22 | Disposition: A | Discharge status: Home / Self Care | Payer: Medicare Other | Source: Ambulatory Visit | Attending: Family Medicine | Admitting: Family Medicine

## 2012-05-22 DIAGNOSIS — Z9181 History of falling: Secondary | ICD-10-CM

## 2012-05-22 DIAGNOSIS — R29898 Other symptoms and signs involving the musculoskeletal system: Secondary | ICD-10-CM

## 2012-05-22 NOTE — Progress Notes (Signed)
Physical Therapy Treatment Patient Details  Name: Cody Wood MRN: [id number] Date of Birth: Jun 06, 1946  Today's Date: 05/22/2012 Time: 4098-1191 PT Time Calculation (min): 45 min  Visit#: 7  of 12   Re-eval:      Authorization:    Authorization Time Period:    Authorization Visit#: 7  of 10    Subjective: Symptoms/Limitations Symptoms: Pt states that he tries to do his ex at home.  Pain Assessment Currently in Pain?: Yes Pain Score:   4 Pain Location: Back Pain Orientation: Lower;Medial   Exercise/Treatments     Balance Exercises Standing SLS: Limitations SLS Limitations: L:20" R:5" max of 4 Balance Beam:  (2 rt; forward and backward) Tandem Gait: 2 reps;Limitations Tandem Gait Limitations: over hurdles Sidestepping: 2 reps;Theraband Theraband Level (Sidestepping): Level 4 (Blue) Numbers 1-15: Foam;1 rep Cone Rotation: Foam Heel Raises: 15 reps Lift / Chop:  (functional squat with good body mechanics) Other Standing Exercises: lunge onto bosu x 10 Other Standing Exercises: rockerboard A/P and R/L x 2' each     Seated Other Seated Exercises: bike x 7'@3 .0    Physical Therapy Assessment and Plan PT Assessment and Plan Clinical Impression Statement: Pt continues to improve with both activity tolerance and balance. Rehab Potential: Good PT Plan: begin wall bumps next treatment; functional squat on wobble boards.    Goals    Problem List Patient Active Problem List  Diagnosis  . Chest pain on exertion  . Hodgkin's lymphoma  . Personal history of falling, presenting hazards to health  . Bilateral leg weakness    PT - End of Session Equipment Utilized During Treatment: Gait belt Activity Tolerance: Patient tolerated treatment well General Behavior During Session: [facility] for tasks performed Cognition: [facility] for tasks performed  GP    Emelie Newsom,CINDY 05/22/2012, 3:24 PM

## 2012-05-25 ENCOUNTER — Ambulatory Visit (HOSPITAL_COMMUNITY)
Admission: RE | Admit: 2012-05-25 | Discharge: 2012-05-25 | Disposition: A | Discharge status: Home / Self Care | Payer: Medicare Other | Source: Ambulatory Visit | Attending: Family Medicine | Admitting: Family Medicine

## 2012-05-25 NOTE — Progress Notes (Signed)
Physical Therapy Treatment Patient Details  Name: Cody Wood MRN: [id number] Date of Birth: Nov 04, 1945  Today's Date: 05/25/2012 Time: 7829-5621 PT Time Calculation (min): 43 min Visit#: 8  of 12   Re-eval: 06/04/12 Charges:  NMR 40'    Authorization: Medicare  Authorization Time Period:    Authorization Visit#: 8  of 10    Subjective: Symptoms/Limitations Symptoms: Pt. states his back is killing him today.  5/10 pain with no known cause; stated it began hurting last night. Pain Assessment Currently in Pain?: Yes Pain Score:   5 Pain Location: Back Pain Orientation: Lower;Medial  Exercise/Treatments Balance Exercises Standing SLS Limitations: L:15" R:5" max of 4 Wall Bumps: 10 reps;Hips;Shoulder Balance Beam: 2RT each, forward and side stepping Sidestepping: 2 reps;Theraband Theraband Level (Sidestepping): Level 4 (Blue) Step Over Hurdles / Cones: 2 RT 6"/ Heel Raises: 15 reps Sit to Stand: Standard surface;Limitations Sit to Stand Limitations: 10 reps R then L; no UE assist Other Standing Exercises: lunge onto bosu x 10 Other Standing Exercises: rockerboard A/P and R/L x 2' each     Physical Therapy Assessment and Plan PT Assessment and Plan Clinical Impression Statement: Resumed wall bumps with shoulders being most diffiucult for pt.  Pt. required max cues to step over hurdles/not around.  Pt. only required 1 standing and 1 sitting rest break during session today. PT Plan: Continue to progress balance and strength. G-Code update needed X next 2 visits.     Problem List Patient Active Problem List  Diagnosis  . Chest pain on exertion  . Hodgkin's lymphoma  . Personal history of falling, presenting hazards to health  . Bilateral leg weakness    PT - End of Session Equipment Utilized During Treatment: Gait belt Activity Tolerance: Patient tolerated treatment well General Behavior During Session: [facility] for tasks performed Cognition: [facility] for tasks  performed   Lurena Nida, PTA/CLT 05/25/2012, 1:50 PM

## 2012-05-27 ENCOUNTER — Ambulatory Visit (HOSPITAL_COMMUNITY)
Admission: RE | Admit: 2012-05-27 | Discharge: 2012-05-27 | Disposition: A | Discharge status: Home / Self Care | Payer: Medicare Other | Source: Ambulatory Visit | Attending: Family Medicine | Admitting: Family Medicine

## 2012-05-27 NOTE — Progress Notes (Signed)
Physical Therapy Treatment Patient Details  Name: DEMETRIE BORGE MRN: [id number] Date of Birth: 05/31/1946  Today's Date: 05/27/2012 Time: 6295-2841 PT Time Calculation (min): 40 min  Visit#: 9  of 12   Re-eval: 06/04/12 Charges: NMR x 38'  Authorization: Medicare  Authorization Visit#: 9  of 10    Subjective: Symptoms/Limitations Symptoms: Pt states that he fell going down the stairs yesterday but he did not hurt himself. He also states he almost fell getting out of the tub this morning. Pain Assessment Currently in Pain?: No/denies   Exercise/Treatments Standing Other Standing Knee Exercises: Side step over 6" hurdle x 5 Other Standing Knee Exercises: Side step onto 6" step x 10 B SLS Limitations: L:8" R:5" max of 4 Wall Bumps: 10 reps;Hips;Shoulder Balance Beam: 2RT each, forward and side stepping Heel Raises: 15 reps Toe Raise: 15 reps Sit to Stand: Standard surface;Limitations Sit to Stand Limitations: 10 reps R then L; no UE assist Other Standing Exercises: rockerboard A/P and R/L x 2' each   Physical Therapy Assessment and Plan PT Assessment and Plan Clinical Impression Statement: Pt continues to display improved activity tolerance. Pt required only one seated rest break throughout session. Began side stepping activities to improve balance with getting in and out of shower. Pt is without complaint throughout session. PT Plan: Continue to progress balance and strength. G-Code update needed next visit.     Problem List Patient Active Problem List  Diagnosis  . Chest pain on exertion  . Hodgkin's lymphoma  . Personal history of falling, presenting hazards to health  . Bilateral leg weakness    PT - End of Session Equipment Utilized During Treatment: Gait belt Activity Tolerance: Patient tolerated treatment well General Behavior During Session: [facility] for tasks performed Cognition: [facility] for tasks performed  [name], PTA 05/27/2012, 5:09 PM

## 2012-05-29 ENCOUNTER — Ambulatory Visit (HOSPITAL_COMMUNITY)
Admission: RE | Admit: 2012-05-29 | Discharge: 2012-05-29 | Disposition: A | Discharge status: Home / Self Care | Payer: Medicare Other | Source: Ambulatory Visit | Attending: Family Medicine | Admitting: Family Medicine

## 2012-05-29 DIAGNOSIS — R29898 Other symptoms and signs involving the musculoskeletal system: Secondary | ICD-10-CM

## 2012-05-29 DIAGNOSIS — Z9181 History of falling: Secondary | ICD-10-CM

## 2012-05-29 NOTE — Progress Notes (Signed)
Physical Therapy Treatment Patient Details  Name: DELANDO SATTER MRN: [id number] Date of Birth: 1946-08-30  Today's Date: 05/29/2012 Time: 1300-1347 PT Time Calculation (min): 47 min  Visit#: 10  of 12   Re-eval:  06/06/12    Authorization:   Medicare Authorization Time Period:    Authorization Visit#: 10  of 20    Subjective: Symptoms/Limitations Symptoms: Pt states he has no pain   Exercise/Treatments  Balance Exercises Standing Standing Eyes Opened: Limitations Standing Eyes Opened Limitations: elliptical x 4' Balance Master: Limits for Stability: test Balance Master: Dynamic: test Balance Beam: Tandem forward/backward, step over hurdles all x 2 RT Numbers 1-15: Foam Lift / Chop:  (minisquat with feet on wobble board.) Other Standing Exercises: Lunge onto Bosu x 10 Other Standing Exercises: rockerboard a/P; R/L x 1' @  Physical Therapy Assessment and Plan PT Assessment and Plan Clinical Impression Statement: Added balance master today to program as well as wobble boards.  Pt ABC confidence score has improved from 25 to 71 %/ Gcode updated PT Frequency: Min 2X/week PT Plan: Reassess next week     Goals    Problem List Patient Active Problem List  Diagnosis  . Chest pain on exertion  . Hodgkin's lymphoma  . Personal history of falling, presenting hazards to health  . Bilateral leg weakness    PT - End of Session Equipment Utilized During Treatment: Gait belt Activity Tolerance: Patient tolerated treatment well General Behavior During Session: [facility] for tasks performed Cognition: [facility] for tasks performed  GP Functional Assessment Tool Used: ABC, sit to stand and TUG Mobility: Walking and Moving Around Current Status (W0981): At least 20 percent but less than 40 percent impaired, limited or restricted Mobility: Walking and Moving Around Goal Status [phone]): At least 1 percent but less than 20 percent impaired, limited or restricted  RUSSELL,CINDY 05/29/2012,  1:58 PM

## 2012-06-02 ENCOUNTER — Ambulatory Visit (HOSPITAL_COMMUNITY)
Admission: RE | Admit: 2012-06-02 | Discharge: 2012-06-02 | Disposition: A | Discharge status: Home / Self Care | Payer: Medicare Other | Source: Ambulatory Visit | Attending: Family Medicine | Admitting: Family Medicine

## 2012-06-02 DIAGNOSIS — M545 Low back pain, unspecified: Secondary | ICD-10-CM | POA: Insufficient documentation

## 2012-06-02 DIAGNOSIS — M546 Pain in thoracic spine: Secondary | ICD-10-CM | POA: Insufficient documentation

## 2012-06-02 DIAGNOSIS — M6281 Muscle weakness (generalized): Secondary | ICD-10-CM | POA: Insufficient documentation

## 2012-06-02 DIAGNOSIS — R269 Unspecified abnormalities of gait and mobility: Secondary | ICD-10-CM | POA: Insufficient documentation

## 2012-06-02 DIAGNOSIS — M25569 Pain in unspecified knee: Secondary | ICD-10-CM | POA: Insufficient documentation

## 2012-06-02 DIAGNOSIS — IMO0001 Reserved for inherently not codable concepts without codable children: Secondary | ICD-10-CM | POA: Insufficient documentation

## 2012-06-02 NOTE — Progress Notes (Signed)
Physical Therapy Treatment Patient Details  Name: Cody Wood MRN: [id number] Date of Birth: 1946-06-05  Today's Date: 06/02/2012 Time: 1520-1600 PT Time Calculation (min): 40 min  Visit#: 11  of 12   Re-eval: 06/04/12 Charges: NMR x 33' Therex x 5'  Authorization: Medicare  Authorization Visit#: 11  of 20    Subjective: Symptoms/Limitations Symptoms: Pt states that he feels his balance is improving. Pain Assessment Currently in Pain?: No/denies Pain Score: 0-No pain   Exercise/Treatments Balance Exercises Standing Balance Master: Limits for Stability: 1:06 with L UE assist Balance Master: Dynamic: 2x1' w/UE assist PRN Balance Beam: Tandem forward/backward, step over hurdles all x 2 RT Numbers 1-15: 1 rep;Foam Other Standing Exercises: Elliptical L 1 x 5' (to improve activity tolerance) Other Standing Exercises: Rockerboard a/P; R/L x 1' @   Physical Therapy Assessment and Plan PT Assessment and Plan Clinical Impression Statement: Pt continues to present with improved gait stability and proprioceptive control. Pt required two sitting rest breaks this session but was able to tolerate 5' on elliptical. Pt is without complaint at end of session. PT Frequency: Min 2X/week PT Plan: Continue to progress per PT POC. Reassess at end of week.     Problem List Patient Active Problem List  Diagnosis  . Chest pain on exertion  . Hodgkin's lymphoma  . Personal history of falling, presenting hazards to health  . Bilateral leg weakness    PT - End of Session Equipment Utilized During Treatment: Gait belt Activity Tolerance: Patient tolerated treatment well General Behavior During Session: [facility] for tasks performed Cognition: [facility] for tasks performed  GP Functional Assessment Tool Used: ABC, sit to stand and TUG  [name], PTA 06/02/2012, 6:21 PM

## 2012-06-03 ENCOUNTER — Ambulatory Visit (HOSPITAL_COMMUNITY)
Admission: RE | Admit: 2012-06-03 | Discharge: 2012-06-03 | Disposition: A | Discharge status: Home / Self Care | Payer: Medicare Other | Source: Ambulatory Visit | Attending: Family Medicine | Admitting: Family Medicine

## 2012-06-03 NOTE — Progress Notes (Signed)
Physical Therapy Treatment Patient Details  Name: Cody Wood MRN: [id number] Date of Birth: 1946-03-18  Today's Date: 06/03/2012 Time: 1352-1430 PT Time Calculation (min): 38 min Visit#: 12  of 12   Re-eval: 06/05/12 Authorization Visit#: 12  of 20    Subjective: Symptoms/Limitations Symptoms: Pt. states he is feeling stronger and more stable.  Pt. reports no pain or difficulty today. Pain Assessment Currently in Pain?: No/denies   Exercise/Treatments Balance Exercises Standing Wall Bumps: 10 reps;Shoulder Balance Beam: side steps, Tandem forward/backward, step over hurdles all x 2 RT Numbers 1-15: 2 reps;Balance Beam;Limitations Numbers 1-15 Limitations: R / L UE each Other Standing Exercises: Elliptical L 1 x 5' Other Standing Exercises: Rockerboard A/P; R/L x 1' @     Physical Therapy Assessment and Plan PT Assessment and Plan Clinical Impression Statement: Pt. with mostly A/P instability, however has improved balance and stability overall.  Pt. able to complete 5' on elliptical with slight fatigue, improving activity tolerance.   PT Plan: Re-assess next visit.     Problem List Patient Active Problem List  Diagnosis  . Chest pain on exertion  . Hodgkin's lymphoma  . Personal history of falling, presenting hazards to health  . Bilateral leg weakness    PT - End of Session Equipment Utilized During Treatment: Gait belt Activity Tolerance: Patient tolerated treatment well General Behavior During Session: [facility] for tasks performed Cognition: [facility] for tasks performed   Lurena Nida, PTA/CLT 06/03/2012, 2:33 PM

## 2012-06-05 ENCOUNTER — Ambulatory Visit (HOSPITAL_COMMUNITY)
Admission: RE | Admit: 2012-06-05 | Discharge: 2012-06-05 | Disposition: A | Discharge status: Home / Self Care | Payer: Medicare Other | Source: Ambulatory Visit | Attending: Family Medicine | Admitting: Family Medicine

## 2012-06-05 DIAGNOSIS — Z9181 History of falling: Secondary | ICD-10-CM

## 2012-06-05 DIAGNOSIS — R29898 Other symptoms and signs involving the musculoskeletal system: Secondary | ICD-10-CM

## 2012-06-05 NOTE — Progress Notes (Signed)
Physical Therapy Treatment Patient Details  Name: Cody Wood MRN: [id number] Date of Birth: November 05, 1945  Today's Date: 06/05/2012 Time:  -     Visit#: 13  of 15   Re-eval: 06/12/12    Authorization: Medicare  Authorization Time Period:    Authorization Visit#: 13  of 15    Subjective: Symptoms/Limitations Symptoms: Pt has no complaints Pain Assessment Currently in Pain?: No/denies Pain Score: 0-No pain Pain Location: Finger (Comment which one)    Exercise/Treatments   Balance Exercises Standing Wall Bumps: Shoulder;15 reps Balance Master: Limits for Stability: 1:03 done secnd time 1:03 Level 6 Balance Master: Dynamic: 2x 1' w/no UE hold Balance Beam: side steps, Tandem forward/backward, step over hurdles all x 2 RT Numbers 1-15: 2 reps;Balance Beam;Limitations Numbers 1-15 Limitations: R / L UE each Lift / Chop:  (lunge onto Bosu ball x 10 @ LE) Other Standing Exercises: Elliptical L 1 x 5' Other Standing Exercises: Rockerboard A/P; R/L x 1' @        Physical Therapy Assessment and Plan PT Assessment and Plan Clinical Impression Statement: Pt improving in funcitonal mobiltiy, confidence and strength. Rehab Potential: Good PT Frequency: Min 2X/week PT Plan: Needs re-assessment next visit.  Begin Yoga poses place balance master on medium instead of easy; turn head while on balance beam  Goals    Problem List Patient Active Problem List  Diagnosis  . Chest pain on exertion  . Hodgkin's lymphoma  . Personal history of falling, presenting hazards to health  . Bilateral leg weakness    PT - End of Session Activity Tolerance: Patient tolerated treatment well General Cognition: [facility] for tasks performed  GP    Graeden Bitner,CINDY 06/05/2012, 10:17 AM

## 2012-06-08 ENCOUNTER — Ambulatory Visit (HOSPITAL_COMMUNITY)
Admission: RE | Admit: 2012-06-08 | Discharge: 2012-06-08 | Disposition: A | Discharge status: Home / Self Care | Payer: Medicare Other | Source: Ambulatory Visit | Attending: Family Medicine | Admitting: Family Medicine

## 2012-06-08 NOTE — Progress Notes (Signed)
Physical Therapy Treatment Patient Details  Name: Cody Wood MRN: [id number] Date of Birth: 10-21-1945  Today's Date: 06/08/2012 Time: 4098-1191 PT Time Calculation (min): 43 min  Visit#: 14  of 15   Re-eval: 06/12/12 Charges: NMR x 38'  Authorization: Medicare  Authorization Visit#: 14  of 15    Subjective: Symptoms/Limitations Symptoms: Pt states his balance is much better. Pain Assessment Currently in Pain?: Yes Pain Score:   6 Pain Location: Back Pain Orientation: Lower   Exercise/Treatments  Standing Wall Bumps: 10 reps;Shoulder Balance Master: Limits for Stability: 1:28 moderate Balance Master: Dynamic: 2' level 6 with UE assist PRN Balance Beam: side steps, Tandem forward/backward, step over hurdles all x 2 RT Other Standing Exercises: Elliptical L 1 x 5' Other Standing Exercises: Rockerboard A/P; R/L x 2'   Physical Therapy Assessment and Plan PT Assessment and Plan Clinical Impression Statement: Pt continues to display improvements in balance and activity tolerance. Pt progressed balance master with minimal difficulty. Pt continues to have most difficulty with balance beam and A/P rocker board. Pt tends to lose his balance posteriorly. Pt reports pain decrease to 2/10 at end of session. PT Plan: Needs re-assessment next visit. Begin yoga poses and head turns on balance beam.     Problem List Patient Active Problem List  Diagnosis  . Chest pain on exertion  . Hodgkin's lymphoma  . Personal history of falling, presenting hazards to health  . Bilateral leg weakness    PT - End of Session Equipment Utilized During Treatment: Gait belt Activity Tolerance: Patient tolerated treatment well General Behavior During Session: [facility] for tasks performed Cognition: [facility] for tasks performed  [name], PTA 06/08/2012, 3:28 PM

## 2012-06-10 ENCOUNTER — Ambulatory Visit (HOSPITAL_COMMUNITY)
Admission: RE | Admit: 2012-06-10 | Discharge: 2012-06-10 | Disposition: A | Discharge status: Home / Self Care | Payer: Medicare Other | Source: Ambulatory Visit | Attending: Family Medicine | Admitting: Family Medicine

## 2012-06-10 DIAGNOSIS — R29898 Other symptoms and signs involving the musculoskeletal system: Secondary | ICD-10-CM

## 2012-06-10 DIAGNOSIS — Z9181 History of falling: Secondary | ICD-10-CM

## 2012-06-10 NOTE — Evaluation (Signed)
Physical Therapy Evaluation  Patient Details  Name: Cody Wood MRN: [id number] Date of Birth: 1946/08/06  Today's Date: 06/10/2012 Time: 0935-1020 PT Time Calculation (min): 45 min  Visit#: 15  of 21   Re-eval: 07/01/12 Assessment Diagnosis: unsteady gait. Prior Therapy: none  Authorization: Medicare  Authorization Time Period:    Authorization Visit#: 15  of 25    Past Medical History:  Past Medical History  Diagnosis Date  . Diabetes mellitus   . Blockage of coronary artery of heart   . hodgkins lymphoma dx'd 06/2008  . Hypertension    Past Surgical History:  Past Surgical History  Procedure Date  . Knee surgery   . Mandible surgery   . Neck surgery     Subjective Symptoms/Limitations Symptoms: Pt states that his balance has really improved. I'm going up and down steps better but it is still difficult. How long can you stand comfortably?: The patient states that he can stand for 30 minutes at this time. The patient was able to stand for 15 minutes.  How long can you walk comfortably?: The patient states that he has walked for 30 minutes now was 10 minutes. Pain Assessment Currently in Pain?: Yes Pain Score:   2 Pain Location: Back Pain Orientation: Lower  Precautions/Restrictions  Precautions Precautions: Fall  Prior Functioning  Home Living Lives With: Family Type of Home: House Home Access: Stairs to enter [employer] of Steps: 2 (unable to complete reciprocally.) Entrance Stairs-Rails: None Prior Function Level of Independence: Independent with basic ADLs Able to Take Stairs?: Reciprically Vocation: On disability Leisure: Hobbies-yes (Comment)  Cognition/Observation Cognition Overall Cognitive Status: Appears within functional limits for tasks assessed  Sensation/Coordination/Flexibility/Functional Tests Functional Tests Functional Tests: ABC's 70% on 8/30 pt was 71% confident Functional Tests: Pt able to complete 5 sit to  stands in 13 sec. Functional Tests: TUG 10 was 12 on 8/30  Assessment RLE Strength Right Hip Flexion: 3+/5 (was 3-) Right Hip Extension: 3+/5 (was 3-/5) Right Hip ABduction: 3+/5 (was 3+/5) Right Hip ADduction: 5/5 (was 3/5) Right Knee Flexion: 5/5 (was 3+/5) Right Knee Extension: 4/5 (was 3+) Right Ankle Dorsiflexion: 3+/5 (was 3+) LLE Strength Left Hip Flexion: 5/5 (was 3+/5) Left Hip Extension: 3+/5 (was 3-/5) Left Hip ABduction: 5/5 (was 5/5) Left Hip ADduction: 5/5 (was 5/5) Left Knee Flexion: 4/5 (was 4/5) Left Knee Extension: 5/5 (was 3+) Left Ankle Dorsiflexion: 5/5 (was 3+/5)  Exercise/Treatments Mobility/Balance  Posture/Postural Control Posture/Postural Control: Postural limitations Berg Balance Test Sit to Stand: Able to stand without using hands and stabilize independently Standing Unsupported: Able to stand safely 2 minutes Sitting with Back Unsupported but Feet Supported on Floor or Stool: Able to sit safely and securely 2 minutes Stand to Sit: Sits safely with minimal use of hands Transfers: Able to transfer safely, definite need of hands Standing Unsupported with Eyes Closed: Able to stand 10 seconds safely Standing Ubsupported with Feet Together: Able to place feet together independently and stand 1 minute safely From Standing, Reach Forward with Outstretched Arm: Can reach confidently >25 cm (10") From Standing Position, Pick up Object from Floor: Able to pick up shoe safely and easily From Standing Position, Turn to Look Behind Over each Shoulder: Looks behind from both sides and weight shifts well Turn 360 Degrees: Able to turn 360 degrees safely one side only in 4 seconds or less Standing Unsupported, Alternately Place Feet on Step/Stool: Able to stand independently and safely and complete 8 steps in 20 seconds Standing Unsupported, One  Foot in Front: Able to plae foot ahead of the other independently and hold 30 seconds Standing on One Leg: Able to lift  leg independently and hold equal to or more than 3 seconds Total Score: 51       Physical Therapy Assessment and Plan PT Assessment and Plan Clinical Impression Statement: Continue to see pt for three more weeks to achieve maximal function.  Pt has improved in all mm grades but has deficits in B hip extensor, Rhip flex and abductor.  Balance is improving; pt will need high level balance activity. PT Frequency: Min 2X/week PT Plan: begin yoga poses; balance master on medium and level 6; sit to stand B to increae hip extensors, all four opposite arm/ let; L fles, ab w/ t-band     Goals Home Exercise Program PT Goal: Perform Home Exercise Program - Progress: Met PT Short Term Goals PT Short Term Goal 1 - Progress: Met PT Short Term Goal 2 - Progress: Met PT Short Term Goal 3 - Progress: Progressing toward goal PT Long Term Goals PT Long Term Goal 1 - Progress: Met PT Long Term Goal 2 - Progress: Met Long Term Goal 3 Progress: Met Long Term Goal 4: Goal was for mm strength to be improved by one grade.  Majority have done this but some have not please see assessment. Long Term Goal 4 Progress: Progressing toward goal PT Long Term Goal 5: Pt was able to complete 5 sit to stand in 13 seconds goal was 12 Long Term Goal 5 Progress: Progressing toward goal PT Long Term Goal 6: Berg balance increased by 3 goal was 6 PT Long Term Goal 7: Tug is at 10 Goal 14 met.  Problem List Patient Active Problem List  Diagnosis  . Chest pain on exertion  . Hodgkin's lymphoma  . Personal history of falling, presenting hazards to health  . Bilateral leg weakness    PT - End of Session Activity Tolerance: Patient tolerated treatment well General Behavior During Session: [facility] for tasks performed Cognition: [facility] for tasks performed  GP Functional Assessment Tool Used: ABC; TUG Functional Limitation: Mobility: Walking and moving around Mobility: Walking and Moving Around Current Status (N0272): At  least 20 percent but less than 40 percent impaired, limited or restricted Mobility: Walking and Moving Around Goal Status [phone]): At least 1 percent but less than 20 percent impaired, limited or restricted  RUSSELL,CINDY 06/10/2012, 12:30 PM  Physician Documentation Your signature is required to indicate approval of the treatment plan as stated above.  Please sign and either send electronically or make a copy of this report for your files and return this physician signed original.   Please mark one 1.__approve of plan  2. ___approve of plan with the following conditions.   ______________________________                                                          _____________________ Physician Signature  Date  

## 2012-06-12 ENCOUNTER — Ambulatory Visit (HOSPITAL_COMMUNITY)
Admission: RE | Admit: 2012-06-12 | Discharge: 2012-06-12 | Disposition: A | Discharge status: Home / Self Care | Payer: Medicare Other | Source: Ambulatory Visit | Attending: Family Medicine | Admitting: Family Medicine

## 2012-06-12 DIAGNOSIS — Z9181 History of falling: Secondary | ICD-10-CM

## 2012-06-12 DIAGNOSIS — R29898 Other symptoms and signs involving the musculoskeletal system: Secondary | ICD-10-CM

## 2012-06-12 NOTE — Progress Notes (Signed)
Physical Therapy Treatment Patient Details  Name: Cody Wood MRN: [id number] Date of Birth: Jan 14, 1946  Today's Date: 06/12/2012 Time: 4098-1191 PT Time Calculation (min): 47 min  Visit#: 16  of 21   Re-eval: 07/01/12   Authorization Visit#: 16  of 21    Subjective: Symptoms/Limitations Symptoms: I'm feeling pretty good today.  Yesterday my back was bothering me.    Exercise/Treatments      Balance Exercises Standing SLS: Eyes open;Foam;5 reps Balance Master: Limits for Stability: 1: 43 med; level 6 Balance Master: Dynamic: 2' level 6' Balance Beam: Lunging on balsnce beam x 2 RT Lift / Chop:  (slowly standing up onto bosu x 10 @) Other Standing Exercises: Elliptical x 6'  Yoga Poses Warrior I: 3 reps;30 seconds Warrior II: 2 reps;30 seconds  Seated Other Seated Exercises: All four opposite arm/leg x5 Other Seated Exercises: sit to stand x 10 R then L     Physical Therapy Assessment and Plan PT Assessment and Plan Clinical Impression Statement: Pt fatigued with new program.  Difficulty with balanceing on foam and lunges on foam balance beam PT Plan: Pt to continue with strengthening and balance activities to improve mobility.    Goals    Problem List Patient Active Problem List  Diagnosis  . Chest pain on exertion  . Hodgkin's lymphoma  . Personal history of falling, presenting hazards to health  . Bilateral leg weakness    PT - End of Session Equipment Utilized During Treatment: Gait belt Activity Tolerance: Patient tolerated treatment well General Behavior During Session: [facility] for tasks performed Cognition: [facility] for tasks performed  GP    Payam Gribble,CINDY 06/12/2012, 1:43 PM

## 2012-06-15 ENCOUNTER — Ambulatory Visit (HOSPITAL_COMMUNITY)
Admission: RE | Admit: 2012-06-15 | Discharge: 2012-06-15 | Disposition: A | Discharge status: Home / Self Care | Payer: Medicare Other | Source: Ambulatory Visit | Attending: Family Medicine | Admitting: Family Medicine

## 2012-06-15 DIAGNOSIS — Z9181 History of falling: Secondary | ICD-10-CM

## 2012-06-15 DIAGNOSIS — R29898 Other symptoms and signs involving the musculoskeletal system: Secondary | ICD-10-CM

## 2012-06-15 NOTE — Progress Notes (Signed)
Physical Therapy Treatment Patient Details  Name: Cody Wood MRN: [id number] Date of Birth: 06/05/1946  Today's Date: 06/15/2012 Time: 4098-1191 PT Time Calculation (min): 44 min  Visit#: 17  of 21   Re-eval: 07/01/12    Authorization: medicare  Authorization Time Period:    Authorization Visit#: 17  of 20    Subjective: Symptoms/Limitations Symptoms: Pt states he picked up something wrong Saturday and his back went out.  Pt states he will be limited by he back today. Pain Assessment Currently in Pain?: Yes Pain Score:   9 Pain Location: Back Pain Orientation: Right;Lower Pain Type: Acute pain   Exercise/Treatments   Stretches Active Hamstring Stretch: 3 reps;30 seconds Aerobic Tread Mill: 1.5 x 6' Supine Bridges: Both;15 reps Straight Leg Raises: Both;10 reps;Limitations Straight Leg Raises Limitations: floating Sidelying Hip ABduction: Strengthening;Right;10 reps;Limitations Hip ABduction Limitations: 5# Prone  Hamstring Curl: 15 reps;Limitations Hamstring Curl Limitations: 5# Straight Leg Raises: Both;15 reps Other Prone Exercises: heel squeeze x 15   Modalities Modalities: Moist Heat Moist Heat Therapy Number Minutes Moist Heat: 20 Minutes Moist Heat Location:  (back)  Physical Therapy Assessment and Plan PT Assessment and Plan Clinical Impression Statement: Pt has high back pain today.  Treatment focuses on strengthening core mm as well as mm that were noted to still be weak at last reevaluation rather than balance today due so as not to aggrevate back condition.  Pt states back pain was down to a 5/10 at end of session. PT Plan: Continue with balance activity next treatment.  Make sure balance activity is not aggrevating Low back.       Problem List Patient Active Problem List  Diagnosis  . Chest pain on exertion  . Hodgkin's lymphoma  . Personal history of falling, presenting hazards to health  . Bilateral leg weakness    PT - End of  Session Activity Tolerance: Patient tolerated treatment well General Cognition: [facility] for tasks performed  GP    RUSSELL,CINDY 06/15/2012, 10:24 AM

## 2012-06-17 ENCOUNTER — Ambulatory Visit (HOSPITAL_COMMUNITY)
Admission: RE | Admit: 2012-06-17 | Discharge: 2012-06-17 | Disposition: A | Discharge status: Home / Self Care | Payer: Medicare Other | Source: Ambulatory Visit | Attending: Family Medicine | Admitting: Family Medicine

## 2012-06-17 NOTE — Progress Notes (Signed)
Physical Therapy Treatment Patient Details  Name: Cody Wood MRN: [id number] Date of Birth: 04-16-1946  Today's Date: 06/17/2012 Time: 2841-3244 PT Time Calculation (min): 48 min  Visit#: 18  of 21   Re-eval: 07/01/12 Charges: Gait x 8' Therex x 18' MHP x 20'  Authorization: medicare  Authorization Visit#: 18  of 20    Subjective: Symptoms/Limitations Symptoms: Pt states that his back pain is making him hurt all over. Pain Assessment Currently in Pain?: Yes Pain Score:   6 Pain Location: Back Pain Orientation: Lower   Exercise/Treatments Aerobic Tread Mill: Gt training .60 cycle x 8' Supine Bridges: 15 reps Straight Leg Raises: Both;10 reps;Limitations Straight Leg Raises Limitations: with manual assstand to keep R hip in neutral rotation Sidelying Hip ABduction: Strengthening;10 reps;Limitations;Both Hip ABduction Limitations: 5# Prone  Hamstring Curl: 15 reps;Limitations Hamstring Curl Limitations: 5#   Modalities Modalities: Moist Heat Moist Heat Therapy Number Minutes Moist Heat: 20 Minutes  Physical Therapy Assessment and Plan PT Assessment and Plan Clinical Impression Statement: Pt continues to be limited by his back pain and requests MHP at beginning of session. Pt tolerates tx well after MHP. Pt requires multimodal cueing to avoid ER on L hip with therex. Tx focus on decreasing pain and improving LE strength/stability. Pt reprots LBP decrease to 2/10 at end of session. PT Plan: Continue with balance activities per PT POC. Make sure balance activity is not aggravating low back.     Problem List Patient Active Problem List  Diagnosis  . Chest pain on exertion  . Hodgkin's lymphoma  . Personal history of falling, presenting hazards to health  . Bilateral leg weakness   [name], PTA 06/17/2012, 10:51 AM

## 2012-06-19 ENCOUNTER — Ambulatory Visit (HOSPITAL_COMMUNITY)
Admission: RE | Admit: 2012-06-19 | Discharge: 2012-06-19 | Disposition: A | Discharge status: Home / Self Care | Payer: Medicare Other | Source: Ambulatory Visit | Attending: Family Medicine | Admitting: Family Medicine

## 2012-06-19 DIAGNOSIS — R29898 Other symptoms and signs involving the musculoskeletal system: Secondary | ICD-10-CM

## 2012-06-19 DIAGNOSIS — Z9181 History of falling: Secondary | ICD-10-CM

## 2012-06-19 NOTE — Evaluation (Signed)
Physical Therapy Evaluation  Patient Details  Name: Cody Wood MRN: [id number] Date of Birth: 03-09-1946  Today's Date: 06/19/2012 Time: 6295-2841 PT Time Calculation (min): 71 min  Visit#: 19  of 19     Authorization: medicare  Authorization Time Period:    Authorization Visit#: 19  of 19    Past Medical History:  Past Medical History  Diagnosis Date  . Diabetes mellitus   . Blockage of coronary artery of heart   . hodgkins lymphoma dx'd 06/2008  . Hypertension    Past Surgical History:  Past Surgical History  Procedure Date  . Knee surgery   . Mandible surgery   . Neck surgery     Subjective Symptoms/Limitations Symptoms: My back is doing better Pain Assessment Currently in Pain?: Yes Pain Score:   3 Pain Location: Back     Sensation/Coordination/Flexibility/Functional Tests Functional Tests Functional Tests: ABC conficence scale 78% was 70% on 9/11 Functional Tests: completed 10 sit to stand in 30 sec Functional Tests: TUG 8 sec was 10 on 9/11  Assessment RLE Strength Right Hip Flexion: 4/5 Right Hip Extension: 4/5 Right Hip ABduction: 4/5 Right Hip ADduction: 5/5 Right Knee Flexion: 5/5 Right Knee Extension: 4/5 Right Ankle Dorsiflexion: 4/5 LLE Strength Left Hip Flexion: 5/5 Left Hip Extension: 4/5 Left Hip ABduction: 5/5 Left Hip ADduction: 5/5 Left Knee Flexion: 5/5 Left Knee Extension: 5/5 Left Ankle Dorsiflexion: 5/5  Exercise/Treatments    Balance Exercises Standing SLS: Eyes closed;Foam;5 reps Balance Master: Limits for Stability: 1: 43 med; level 6 Balance Master: Dynamic: 2' level 6' Balance Beam: Lunging on balsnce beam x 2 RT Lift / Chop:  (slowly standing up onto bosu x 10 @) Other Standing Exercises: Elliptical x 6'  Yoga Poses Warrior I: 2 reps;60 seconds Warrior II: 2 reps;60 seconds  Seated Other Seated Exercises: sit to stand x 10 R then L        Physical Therapy Assessment and Plan PT Assessment and  Plan Clinical Impression Statement: Pt has improved significantly in his TUG and ABC balance score.  Pt states he feels much more stable and is doing his exercises at home PT Plan: discharge patient to HEP     Home Exercise Program PT Goal: Perform Home Exercise Program - Progress: Met PT Short Term Goals PT Short Term Goal 1 - Progress: Met PT Short Term Goal 2 - Progress: Met PT Short Term Goal 3 - Progress: Met PT Long Term Goals PT Long Term Goal 1 - Progress: Met PT Long Term Goal 2 - Progress: Met Long Term Goal 3 Progress: Met Long Term Goal 4 Progress: Met PT Long Term Goal 5: goal 12 sit to stand in 30 sec pt was able to do 10 Long Term Goal 5 Progress: Progressing toward goal Additional PT Long Term Goals?: Yes Long Term Goal 7 Progress: Met  Problem List Patient Active Problem List  Diagnosis  . Chest pain on exertion  . Hodgkin's lymphoma  . Personal history of falling, presenting hazards to health  . Bilateral leg weakness    PT - End of Session Activity Tolerance: Patient tolerated treatment well General Behavior During Session: [facility] for tasks performed PT Plan of Care PT Home Exercise Plan: given  GP Functional Assessment Tool Used: ABC, sit to stand and TUG Functional Limitation: Mobility: Walking and moving around Mobility: Walking and Moving Around Current Status (L2440): At least 1 percent but less than 20 percent impaired, limited or restricted Mobility: Walking and Moving Around  Discharge Status [phone]): At least 1 percent but less than 20 percent impaired, limited or restricted  RUSSELL,CINDY 06/19/2012, 10:47 AM  Physician Documentation Your signature is required to indicate approval of the treatment plan as stated above.  Please sign and either send electronically or make a copy of this report for your files and return this physician signed original.   Please mark one 1.__approve of plan  2. ___approve of plan with the following  conditions.   ______________________________                                                          _____________________ Physician Signature                                                                                                             Date

## 2012-10-27 ENCOUNTER — Other Ambulatory Visit (HOSPITAL_BASED_OUTPATIENT_CLINIC_OR_DEPARTMENT_OTHER): Payer: Medicare Other | Admitting: Lab

## 2012-10-27 ENCOUNTER — Ambulatory Visit (HOSPITAL_COMMUNITY)
Admission: RE | Admit: 2012-10-27 | Discharge: 2012-10-27 | Disposition: A | Discharge status: Home / Self Care | Payer: Medicare Other | Source: Ambulatory Visit | Attending: Internal Medicine | Admitting: Internal Medicine

## 2012-10-27 DIAGNOSIS — I2510 Atherosclerotic heart disease of native coronary artery without angina pectoris: Secondary | ICD-10-CM | POA: Insufficient documentation

## 2012-10-27 DIAGNOSIS — M47814 Spondylosis without myelopathy or radiculopathy, thoracic region: Secondary | ICD-10-CM | POA: Insufficient documentation

## 2012-10-27 DIAGNOSIS — M47817 Spondylosis without myelopathy or radiculopathy, lumbosacral region: Secondary | ICD-10-CM | POA: Insufficient documentation

## 2012-10-27 DIAGNOSIS — M5137 Other intervertebral disc degeneration, lumbosacral region: Secondary | ICD-10-CM | POA: Insufficient documentation

## 2012-10-27 DIAGNOSIS — C8190 Hodgkin lymphoma, unspecified, unspecified site: Secondary | ICD-10-CM

## 2012-10-27 DIAGNOSIS — M51379 Other intervertebral disc degeneration, lumbosacral region without mention of lumbar back pain or lower extremity pain: Secondary | ICD-10-CM | POA: Insufficient documentation

## 2012-10-27 LAB — COMPREHENSIVE METABOLIC PANEL (CC13)
ALT: 9 U/L (ref 0–55)
AST: 11 U/L (ref 5–34)
Albumin: 3.8 g/dL (ref 3.5–5.0)
Alkaline Phosphatase: 92 U/L (ref 40–150)
BUN: 14.4 mg/dL (ref 7.0–26.0)
CO2: 28 mEq/L (ref 22–29)
Calcium: 9.4 mg/dL (ref 8.4–10.4)
Chloride: 101 mEq/L (ref 98–107)
Creatinine: 1 mg/dL (ref 0.7–1.3)
Glucose: 174 mg/dl — ABNORMAL HIGH (ref 70–99)
Potassium: 4.5 mEq/L (ref 3.5–5.1)
Sodium: 138 mEq/L (ref 136–145)
Total Bilirubin: 0.61 mg/dL (ref 0.20–1.20)
Total Protein: 6.7 g/dL (ref 6.4–8.3)

## 2012-10-27 LAB — CBC WITH DIFFERENTIAL/PLATELET
BASO%: 0.4 % (ref 0.0–2.0)
Basophils Absolute: 0 10*3/uL (ref 0.0–0.1)
EOS%: 3.6 % (ref 0.0–7.0)
Eosinophils Absolute: 0.1 10*3/uL (ref 0.0–0.5)
HCT: 39.3 % (ref 38.4–49.9)
HGB: 12.9 g/dL — ABNORMAL LOW (ref 13.0–17.1)
LYMPH%: 23.4 % (ref 14.0–49.0)
MCH: 26.2 pg — ABNORMAL LOW (ref 27.2–33.4)
MCHC: 32.8 g/dL (ref 32.0–36.0)
MCV: 79.8 fL (ref 79.3–98.0)
MONO#: 0.3 10*3/uL (ref 0.1–0.9)
MONO%: 9.3 % (ref 0.0–14.0)
NEUT#: 2 10*3/uL (ref 1.5–6.5)
NEUT%: 63.3 % (ref 39.0–75.0)
Platelets: 147 10*3/uL (ref 140–400)
RBC: 4.92 10*6/uL (ref 4.20–5.82)
RDW: 14.3 % (ref 11.0–14.6)
WBC: 3.1 10*3/uL — ABNORMAL LOW (ref 4.0–10.3)
lymph#: 0.7 10*3/uL — ABNORMAL LOW (ref 0.9–3.3)

## 2012-10-27 MED ORDER — IOHEXOL 300 MG/ML  SOLN
100.0000 mL | Freq: Once | INTRAMUSCULAR | Status: AC | PRN
  Administered 2012-10-27: 100 mL via INTRAVENOUS

## 2012-10-29 ENCOUNTER — Ambulatory Visit (HOSPITAL_BASED_OUTPATIENT_CLINIC_OR_DEPARTMENT_OTHER): Payer: Medicare Other | Admitting: Internal Medicine

## 2012-10-29 ENCOUNTER — Encounter: Payer: Self-pay | Admitting: Internal Medicine

## 2012-10-29 ENCOUNTER — Telehealth: Payer: Self-pay | Admitting: Internal Medicine

## 2012-10-29 VITALS — BP 170/76 | HR 67 | Temp 98.2°F | Resp 18 | Ht 60.5 in | Wt 227.0 lb

## 2012-10-29 DIAGNOSIS — C8191 Hodgkin lymphoma, unspecified, lymph nodes of head, face, and neck: Secondary | ICD-10-CM

## 2012-10-29 DIAGNOSIS — C8190 Hodgkin lymphoma, unspecified, unspecified site: Secondary | ICD-10-CM

## 2012-10-29 NOTE — Progress Notes (Signed)
[facility] Telephone:(336) [phone]   Fax:(336) [phone]  OFFICE PROGRESS NOTE  BURNETT,BRENT A, MD 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   PRIOR THERAPY:  1. Status post 6 cycles of systemic chemotherapy with ABVD. Last dose was given January 12, 2009. 2. Status post palliative radiotherapy to the recurrent disease in the neck under the care of Dr. Kathrynn Running, completed January 21, 2011.  CURRENT THERAPY: Observation.   INTERVAL HISTORY: Cody Wood 67 y.o. male returns to the clinic today for routine six-month followup visit. The patient is feeling fine today with no specific complaints. He is currently not working and spending more time at home. He denied having any significant weight loss or night sweats. He denied having any palpable lymphadenopathy. The patient has no chest pain, shortness breath, cough or hemoptysis. He has repeat CT scan of the neck, chest, abdomen and pelvis performed recently and he is here for evaluation and discussion of his scan results.  MEDICAL HISTORY: Past Medical History  Diagnosis Date  . Diabetes mellitus   . Blockage of coronary artery of heart   . hodgkins lymphoma dx'd 06/2008  . Hypertension     ALLERGIES:   has no known allergies.  MEDICATIONS:  Current Outpatient Prescriptions  Medication Sig Dispense Refill  . aspirin 81 MG tablet Take 81 mg by mouth daily.        . carvedilol (COREG) 3.125 MG tablet Take 3.125 mg by mouth 2 (two) times daily with a meal.        . insulin glargine (LANTUS) 100 UNIT/ML injection Inject 8 Units into the skin as needed. Sliding scale      . metFORMIN (GLUCOPHAGE) 500 MG tablet Take 500 mg by mouth 2 (two) times daily with a meal.      . pioglitazone (ACTOS) 45 MG tablet Take 45 mg by mouth daily.        . rosuvastatin (CRESTOR) 20 MG tablet Take 20 mg by mouth daily.          SURGICAL  HISTORY:  Past Surgical History  Procedure Date  . Knee surgery   . Mandible surgery   . Neck surgery     REVIEW OF SYSTEMS:  A comprehensive review of systems was negative.   PHYSICAL EXAMINATION: General appearance: alert, cooperative and no distress Head: Normocephalic, without obvious abnormality, atraumatic Neck: no adenopathy Lymph nodes: Cervical, supraclavicular, and axillary nodes normal. Resp: clear to auscultation bilaterally Cardio: regular rate and rhythm, S1, S2 normal, no murmur, click, rub or gallop GI: soft, non-tender; bowel sounds normal; no masses,  no organomegaly Extremities: extremities normal, atraumatic, no cyanosis or edema  ECOG PERFORMANCE STATUS: 0 - Asymptomatic  Blood pressure 170/76, pulse 67, temperature 98.2 F (36.8 C), temperature source Oral, resp. rate 18, height 5' 0.5" (1.537 m), weight 227 lb (102.967 kg).  LABORATORY DATA: Lab Results  Component Value Date   WBC 3.1* 10/27/2012   HGB 12.9* 10/27/2012   HCT 39.3 10/27/2012   MCV 79.8 10/27/2012   PLT 147 10/27/2012      Chemistry      Component Value Date/Time   NA 138 10/27/2012 1045   NA 143 04/27/2012 1029   NA 138 10/24/2011 0935   K 4.5 10/27/2012 1045   K 4.4 04/27/2012 1029   K 4.8 10/24/2011 0935   CL 101 10/27/2012 1045   CL 98  04/27/2012 1029   CL 100 10/24/2011 0935   CO2 28 10/27/2012 1045   CO2 28 04/27/2012 1029   CO2 27 10/24/2011 0935   BUN 14.4 10/27/2012 1045   BUN 18 04/27/2012 1029   BUN 12 10/24/2011 0935   CREATININE 1.0 10/27/2012 1045   CREATININE 0.9 04/27/2012 1029   CREATININE 1.04 10/24/2011 0935      Component Value Date/Time   CALCIUM 9.4 10/27/2012 1045   CALCIUM 9.7 04/27/2012 1029   CALCIUM 9.7 10/24/2011 0935   ALKPHOS 92 10/27/2012 1045   ALKPHOS 78 04/27/2012 1029   ALKPHOS 71 10/24/2011 0935   AST 11 10/27/2012 1045   AST 19 04/27/2012 1029   AST 16 10/24/2011 0935   ALT 9 10/27/2012 1045   ALT 14 10/24/2011 0935   BILITOT 0.61 10/27/2012 1045   BILITOT  0.70 04/27/2012 1029   BILITOT 0.5 10/24/2011 0935       RADIOGRAPHIC STUDIES: Ct Soft Tissue Neck W Contrast  10/27/2012  *RADIOLOGY REPORT*  Clinical Data: 67 year old male with Hodgkin's lymphoma diagnosed in 2009.  Chemotherapy and radiation complete.  CT NECK WITH CONTRAST  Technique:  Multidetector CT imaging of the neck was performed with intravenous contrast.  Contrast: OMNIPAQUE IOHEXOL 300 MG/ML  SOLN In conjunction with CT(s) of the CT chest abdomen and pelvis which is(are) reported separately.  Comparison: 04/27/2012 and earlier.  Findings: Chest findings are reported separately.  Negative thyroid, submandibular glands and parotid glands.  Stable pharynx soft tissue contours.  Stable mild thickening of the epiglottis which may relate to prior radiation.  Glottis is closed. Larynx remains within normal limits.  Previous cervical ACDF. Retropharyngeal space, parapharyngeal spaces and sublingual space within normal limits.  Chronic carotid atherosclerosis.  Major vascular structures in the neck are patent.  Negative visualized brain parenchyma.  No cervical lymphadenopathy.  Stable residual small cervical lymph nodes.  No acute osseous abnormality identified.  Solid appearing cervical interbody arthrodesis from C3 to the C7 level.  IMPRESSION: 1.  Stable and satisfactory post-therapy appearance of the neck. 2.  Chest, abdomen and pelvis findings reported separately.   Original Report Authenticated By: Erskine Speed, M.D.    Ct Chest W Contrast  10/27/2012  *RADIOLOGY REPORT*  Clinical Data:  Hodgkin's lymphoma  CT CHEST, ABDOMEN AND PELVIS WITH CONTRAST  Technique:  Multidetector CT imaging of the chest, abdomen and pelvis was performed following the standard protocol during bolus administration of intravenous contrast.  Contrast: OMNIPAQUE IOHEXOL 300 MG/ML  SOLN  Comparison:  Multiple exams, including  04/27/2012   CT CHEST  Findings:  No pathologic thoracic adenopathy observed.   Coronary artery atherosclerosis is present.  No pleural effusion.  Despite efforts by the patient and technologist, motion artifact is present on some series of today's examination and could not be totally eliminated.  This reduces diagnostic sensitivity and specificity.  There is evidence of old granulomatous disease.  No findings of thoracic malignancy.  Thoracic spondylosis is present.  There is mild dextroconvex thoracic scoliosis.  IMPRESSION:  1.  No findings of thoracic malignancy. 2.  Atherosclerosis. 3.  Thoracic spondylosis.   CT ABDOMEN AND PELVIS  Findings:  Despite efforts by the patient and technologist, motion artifact is present on some series of today's examination and could not be totally eliminated.  This reduces diagnostic sensitivity and specificity.  The liver, spleen, pancreas, and adrenal glands appear unremarkable.  The gallbladder and biliary system appear unremarkable.  Aortoiliac atherosclerotic calcification is present.  Small retroperitoneal lymph nodes are not pathologically enlarged by size criteria.  The kidneys appear unremarkable, as do the proximal ureters.  Urinary bladder is relatively empty, likely accounting for the slightly thick appearance of the wall.  No free pelvic fluid. No pathologic pelvic adenopathy is identified.  There is loss of intervertebral disc height at L4-5 and L5-S1, along with bilateral facet arthropathy.  Posterior osseous ridging is noted at both of these levels, and there is likely mild central stenosis at the L4-5 level.  IMPRESSION:  1. No findings of recurrent lymphoma in the abdomen. 2.  Lower lumbar spondylosis and degenerative disc disease.   Original Report Authenticated By: Gaylyn Rong, M.D.    ASSESSMENT: This is a very pleasant 67 years old white male with history of recurrent Hodgkin's lymphoma status post palliative radiotherapy to the recurrent disease in the right neck area. He has been observation since April 2012 with no evidence  for disease recurrence.   PLAN: I discussed the scan results with the patient today. I recommended for him to continue on observation with repeat CT scan of the neck, chest, abdomen and pelvis in 6 months. He was advised to call me immediately if he has any concerning symptoms in the interval. All questions were answered. The patient knows to call the clinic with any problems, questions or concerns. We can certainly see the patient much sooner if necessary.

## 2012-10-29 NOTE — Telephone Encounter (Signed)
Gave pt appt for lab July 2014 before Ct and MD, gave pt oral contrast for CT , pt will see MD on July 2014

## 2012-10-29 NOTE — Patient Instructions (Signed)
No evidence for disease recurrence on the recent scans. Followup visit in 6 months with repeat CT scan of the neck, chest, abdomen and pelvis.

## 2012-11-02 ENCOUNTER — Emergency Department (HOSPITAL_COMMUNITY): Payer: Medicare Other

## 2012-11-02 ENCOUNTER — Encounter (HOSPITAL_COMMUNITY): Payer: Self-pay | Admitting: *Deleted

## 2012-11-02 ENCOUNTER — Emergency Department (HOSPITAL_COMMUNITY)
Admission: EM | Admit: 2012-11-02 | Discharge: 2012-11-02 | Disposition: A | Discharge status: Home / Self Care | Payer: Medicare Other | Attending: Emergency Medicine | Admitting: Emergency Medicine

## 2012-11-02 DIAGNOSIS — Y9389 Activity, other specified: Secondary | ICD-10-CM | POA: Insufficient documentation

## 2012-11-02 DIAGNOSIS — IMO0002 Reserved for concepts with insufficient information to code with codable children: Secondary | ICD-10-CM | POA: Insufficient documentation

## 2012-11-02 DIAGNOSIS — Z794 Long term (current) use of insulin: Secondary | ICD-10-CM | POA: Insufficient documentation

## 2012-11-02 DIAGNOSIS — Y929 Unspecified place or not applicable: Secondary | ICD-10-CM | POA: Insufficient documentation

## 2012-11-02 DIAGNOSIS — E119 Type 2 diabetes mellitus without complications: Secondary | ICD-10-CM | POA: Insufficient documentation

## 2012-11-02 DIAGNOSIS — Z8679 Personal history of other diseases of the circulatory system: Secondary | ICD-10-CM | POA: Insufficient documentation

## 2012-11-02 DIAGNOSIS — Z79899 Other long term (current) drug therapy: Secondary | ICD-10-CM | POA: Insufficient documentation

## 2012-11-02 DIAGNOSIS — Z8571 Personal history of Hodgkin lymphoma: Secondary | ICD-10-CM | POA: Insufficient documentation

## 2012-11-02 DIAGNOSIS — Z7982 Long term (current) use of aspirin: Secondary | ICD-10-CM | POA: Insufficient documentation

## 2012-11-02 DIAGNOSIS — I10 Essential (primary) hypertension: Secondary | ICD-10-CM | POA: Insufficient documentation

## 2012-11-02 DIAGNOSIS — S62339A Displaced fracture of neck of unspecified metacarpal bone, initial encounter for closed fracture: Secondary | ICD-10-CM

## 2012-11-02 MED ORDER — OXYCODONE-ACETAMINOPHEN 5-325 MG PO TABS: 1.0000 | ORAL_TABLET | Freq: Four times a day (QID) | ORAL | Status: DC | PRN

## 2012-11-02 MED ORDER — OXYCODONE-ACETAMINOPHEN 5-325 MG PO TABS
1.0000 | ORAL_TABLET | Freq: Once | ORAL | Status: AC
  Administered 2012-11-02: 1 via ORAL
  Filled 2012-11-02: qty 1

## 2012-11-02 MED ORDER — PERCOCET 5-325 MG PO TABS: ORAL_TABLET | ORAL | Status: DC
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
## 2012-11-12 MED FILL — Oxycodone w/ Acetaminophen Tab 5-325 MG: ORAL | Qty: 6 | Status: AC

## 2013-04-23 ENCOUNTER — Other Ambulatory Visit (HOSPITAL_BASED_OUTPATIENT_CLINIC_OR_DEPARTMENT_OTHER): Payer: Medicare Other | Admitting: Lab

## 2013-04-23 ENCOUNTER — Ambulatory Visit (HOSPITAL_COMMUNITY)
Admission: RE | Admit: 2013-04-23 | Discharge: 2013-04-23 | Disposition: A | Discharge status: Home / Self Care | Payer: Medicare Other | Source: Ambulatory Visit | Attending: Internal Medicine | Admitting: Internal Medicine

## 2013-04-23 DIAGNOSIS — Z9221 Personal history of antineoplastic chemotherapy: Secondary | ICD-10-CM | POA: Insufficient documentation

## 2013-04-23 DIAGNOSIS — R911 Solitary pulmonary nodule: Secondary | ICD-10-CM | POA: Insufficient documentation

## 2013-04-23 DIAGNOSIS — C8190 Hodgkin lymphoma, unspecified, unspecified site: Secondary | ICD-10-CM

## 2013-04-23 DIAGNOSIS — J9819 Other pulmonary collapse: Secondary | ICD-10-CM | POA: Insufficient documentation

## 2013-04-23 DIAGNOSIS — Z923 Personal history of irradiation: Secondary | ICD-10-CM | POA: Insufficient documentation

## 2013-04-23 LAB — CBC WITH DIFFERENTIAL/PLATELET
BASO%: 0.3 % (ref 0.0–2.0)
Basophils Absolute: 0 10*3/uL (ref 0.0–0.1)
EOS%: 4.1 % (ref 0.0–7.0)
Eosinophils Absolute: 0.1 10*3/uL (ref 0.0–0.5)
HCT: 37.3 % — ABNORMAL LOW (ref 38.4–49.9)
HGB: 12.3 g/dL — ABNORMAL LOW (ref 13.0–17.1)
LYMPH%: 27.1 % (ref 14.0–49.0)
MCH: 26.5 pg — ABNORMAL LOW (ref 27.2–33.4)
MCHC: 32.9 g/dL (ref 32.0–36.0)
MCV: 80.4 fL (ref 79.3–98.0)
MONO#: 0.3 10*3/uL (ref 0.1–0.9)
MONO%: 10 % (ref 0.0–14.0)
NEUT#: 1.5 10*3/uL (ref 1.5–6.5)
NEUT%: 58.5 % (ref 39.0–75.0)
Platelets: 143 10*3/uL (ref 140–400)
RBC: 4.64 10*6/uL (ref 4.20–5.82)
RDW: 14.4 % (ref 11.0–14.6)
WBC: 2.6 10*3/uL — ABNORMAL LOW (ref 4.0–10.3)
lymph#: 0.7 10*3/uL — ABNORMAL LOW (ref 0.9–3.3)

## 2013-04-23 LAB — COMPREHENSIVE METABOLIC PANEL (CC13)
ALT: 11 U/L (ref 0–55)
AST: 12 U/L (ref 5–34)
Albumin: 3.6 g/dL (ref 3.5–5.0)
Alkaline Phosphatase: 68 U/L (ref 40–150)
BUN: 14.4 mg/dL (ref 7.0–26.0)
CO2: 26 mEq/L (ref 22–29)
Calcium: 9.8 mg/dL (ref 8.4–10.4)
Chloride: 102 mEq/L (ref 98–109)
Creatinine: 0.9 mg/dL (ref 0.7–1.3)
Glucose: 209 mg/dl — ABNORMAL HIGH (ref 70–140)
Potassium: 4.5 mEq/L (ref 3.5–5.1)
Sodium: 138 mEq/L (ref 136–145)
Total Bilirubin: 0.48 mg/dL (ref 0.20–1.20)
Total Protein: 6.5 g/dL (ref 6.4–8.3)

## 2013-04-23 MED ORDER — IOHEXOL 300 MG/ML  SOLN
100.0000 mL | Freq: Once | INTRAMUSCULAR | Status: AC | PRN
  Administered 2013-04-23: 100 mL via INTRAVENOUS

## 2013-04-28 ENCOUNTER — Telehealth: Payer: Self-pay | Admitting: Internal Medicine

## 2013-04-28 ENCOUNTER — Other Ambulatory Visit: Payer: Medicare Other | Admitting: Lab

## 2013-04-28 ENCOUNTER — Ambulatory Visit (HOSPITAL_BASED_OUTPATIENT_CLINIC_OR_DEPARTMENT_OTHER): Payer: Medicare Other | Admitting: Internal Medicine

## 2013-04-28 ENCOUNTER — Encounter: Payer: Self-pay | Admitting: Internal Medicine

## 2013-04-28 VITALS — BP 127/64 | HR 66 | Temp 97.5°F | Resp 20 | Ht 68.0 in | Wt 232.4 lb

## 2013-04-28 DIAGNOSIS — C8190 Hodgkin lymphoma, unspecified, unspecified site: Secondary | ICD-10-CM

## 2013-04-28 NOTE — Patient Instructions (Signed)
No evidence for disease recurrence on his recent scan.  Followup visit in 6 months with repeat CT scan of the neck, chest, abdomen and pelvis be

## 2013-04-28 NOTE — Telephone Encounter (Signed)
gv and printed appt sched and avs for pt....gv pt barium   °

## 2013-04-28 NOTE — Progress Notes (Signed)
[facility] Telephone:(336) [phone]   Fax:(336) [phone]  OFFICE PROGRESS NOTE  BURNETT,BRENT A, MD [address] [place] [address]  PRINCIPAL DIAGNOSIS: Recurrent Hodgkin's lymphoma initially diagnosed as stage III in October 2009.   PRIOR THERAPY:  1. Status post 6 cycles of systemic chemotherapy with ABVD. Last dose was given January 12, 2009. 2. Status post palliative radiotherapy to the recurrent disease in the neck under the care of Dr. Kathrynn Running, completed January 21, 2011.  CURRENT THERAPY: Observation.    INTERVAL HISTORY: LEKENDRICK ALPERN 67 y.o. male returns to the clinic today for six-month followup visit. The patient is feeling fine today with no specific complaints. He gained several pounds since his last visit. The patient denied having any significant chest pain but continues to have shortness breath with exertion. Has no cough or hemoptysis. He denied having any significant peripheral lymphadenopathy. The patient has repeat CT scan of the neck, chest, abdomen and pelvis performed recently and he is here for evaluation and discussion of his scan results.  MEDICAL HISTORY: Past Medical History  Diagnosis Date  . Diabetes mellitus   . Blockage of coronary artery of heart   . Hypertension   . hodgkins lymphoma dx'd 06/2008    ALLERGIES:  has No Known Allergies.  MEDICATIONS:  Current Outpatient Prescriptions  Medication Sig Dispense Refill  . aspirin 325 MG tablet Take 325 mg by mouth daily.      . carvedilol (COREG) 3.125 MG tablet Take 3.125 mg by mouth 2 (two) times daily with a meal.        . insulin glargine (LANTUS) 100 UNIT/ML injection Inject 14 Units into the skin at bedtime.       . metFORMIN (GLUCOPHAGE) 500 MG tablet Take 500 mg by mouth 2 (two) times daily with a meal.      . PERCOCET 5-325 MG per tablet Take 1 or 2 po Q 6hrs for pain  20 tablet  0  . pioglitazone (ACTOS) 45 MG tablet Take 45 mg by mouth daily.        . rosuvastatin  (CRESTOR) 20 MG tablet Take 20 mg by mouth daily.         No current facility-administered medications for this visit.    SURGICAL HISTORY:  Past Surgical History  Procedure Laterality Date  . Knee surgery    . Mandible surgery    . Neck surgery      REVIEW OF SYSTEMS:  A comprehensive review of systems was negative except for: Respiratory: positive for dyspnea on exertion   PHYSICAL EXAMINATION: General appearance: alert, cooperative and no distress Head: Normocephalic, without obvious abnormality, atraumatic Neck: no adenopathy Lymph nodes: Cervical, supraclavicular, and axillary nodes normal. Resp: clear to auscultation bilaterally Cardio: regular rate and rhythm, S1, S2 normal, no murmur, click, rub or gallop GI: soft, non-tender; bowel sounds normal; no masses,  no organomegaly Extremities: extremities normal, atraumatic, no cyanosis or edema  ECOG PERFORMANCE STATUS: 1 - Symptomatic but completely ambulatory  Blood pressure 127/64, pulse 66, temperature 97.5 F (36.4 C), temperature source Oral, resp. rate 20, height 5\' 8"  (1.727 m), weight 232 lb 6.4 oz (105.416 kg).  LABORATORY DATA: Lab Results  Component Value Date   WBC 2.6* 04/23/2013   HGB 12.3* 04/23/2013   HCT 37.3* 04/23/2013   MCV 80.4 04/23/2013   PLT 143 04/23/2013      Chemistry      Component Value Date/Time   NA 138  04/23/2013 1035   NA 143 04/27/2012 1029   NA 138 10/24/2011 0935   K 4.5 04/23/2013 1035   K 4.4 04/27/2012 1029   K 4.8 10/24/2011 0935   CL 101 10/27/2012 1045   CL 98 04/27/2012 1029   CL 100 10/24/2011 0935   CO2 26 04/23/2013 1035   CO2 28 04/27/2012 1029   CO2 27 10/24/2011 0935   BUN 14.4 04/23/2013 1035   BUN 18 04/27/2012 1029   BUN 12 10/24/2011 0935   CREATININE 0.9 04/23/2013 1035   CREATININE 0.9 04/27/2012 1029   CREATININE 1.04 10/24/2011 0935      Component Value Date/Time   CALCIUM 9.8 04/23/2013 1035   CALCIUM 9.7 04/27/2012 1029   CALCIUM 9.7 10/24/2011 0935   ALKPHOS 68  04/23/2013 1035   ALKPHOS 78 04/27/2012 1029   ALKPHOS 71 10/24/2011 0935   AST 12 04/23/2013 1035   AST 19 04/27/2012 1029   AST 16 10/24/2011 0935   ALT 11 04/23/2013 1035   ALT 21 04/27/2012 1029   ALT 14 10/24/2011 0935   BILITOT 0.48 04/23/2013 1035   BILITOT 0.70 04/27/2012 1029   BILITOT 0.5 10/24/2011 0935       RADIOGRAPHIC STUDIES: Ct Soft Tissue Neck W Contrast  Ct Chest W Contrast  04/23/2013   *RADIOLOGY REPORT*  Clinical Data:  Lymphoma diagnosed in 2009.  Chemotherapy complete. Radiation therapy complete.  CT NECK, CHEST, ABDOMEN AND PELVIS WITH CONTRAST  Technique:  Multidetector CT imaging of the neck, chest, abdomen and pelvis was performed using the standard protocol following the bolus administration of intravenous contrast.  Contrast: OMNIPAQUE IOHEXOL 300 MG/ML  SOLN  Comparison:  CT 10/27/2012    CT NECK  Findings:  No evidence of the cervical lymphadenopathy.  Limited view of the inferior brain is normal.  Orbits are normal.  Salivary glands are normal.  No submental lymphadenopathy. Review of the of the skeleton demonstrates no aggressive osseous lesions.  Anterior cervical fusion noted.  IMPRESSION: No evidence of lymphoma in the neck.    CT CHEST  Findings: No axillary or supraclavicular lymphadenopathy.  No mediastinal or hilar lymphadenopathy.  No pericardial fluid. Esophagus is normal.  Review of the lung windows demonstrates linear scarring in the right lower lobe.  This is new from comparison exam.  A small adjacent nodule measuring 6 mm (image 34) is in a branching pattern with several smaller nodules and likely represents post infectious or inflammatory process.  This is adjacent to the linear scarring.  IMPRESSION:  1.  No evidence of lymphoma recurrence within the thorax. 2.  New nodularity and linear atelectasis in the left lower lobe is likely post infectious or inflammatory.    CT ABDOMEN AND PELVIS  Findings: No focal hepatic lesion. No biliary duct  dilatation.  The gallbladder, pancreas, spleen, adrenal glands, or kidneys are normal.  The stomach, small bowel, and colon are normal.  Abdominal aorta normal caliber.  No retroperitoneal periportal lymphadenopathy.  No free fluid the pelvis. Prostate gland and bladder are normal.   No pelvic lymphadenopathy.  No evidence of peritoneal mesenteric disease. Review of  bone windows demonstrates no aggressive osseous lesions.  IMPRESSION: 1.  No evidence of lymphoma recurrence within the abdomen or pelvis. 2.  Normal spleen.   Original Report Authenticated By: Genevive Bi, M.D.    ASSESSMENT AND PLAN: This is a very pleasant 67 years old white male with history of recurrent Hodgkin's lymphoma last treatment with with palliative  radiation in April 2012 and the patient has been observation since that time was no evidence for disease recurrence. I discussed the scan results with the patient today. I recommended for him to continue on observation with repeat CT scan of the neck, chest, abdomen and pelvis in 6 months. He was advised to call immediately if he has any concerning symptoms in the interval.  The patient voices understanding of current disease status and treatment options and is in agreement with the current care plan.  All questions were answered. The patient knows to call the clinic with any problems, questions or concerns. We can certainly see the patient much sooner if necessary.

## 2013-10-25 ENCOUNTER — Other Ambulatory Visit: Payer: Self-pay | Admitting: *Deleted

## 2013-10-25 DIAGNOSIS — C8190 Hodgkin lymphoma, unspecified, unspecified site: Secondary | ICD-10-CM

## 2013-10-26 ENCOUNTER — Other Ambulatory Visit (HOSPITAL_BASED_OUTPATIENT_CLINIC_OR_DEPARTMENT_OTHER): Payer: Medicare Other

## 2013-10-26 ENCOUNTER — Ambulatory Visit (HOSPITAL_COMMUNITY)
Admission: RE | Admit: 2013-10-26 | Discharge: 2013-10-26 | Disposition: A | Discharge status: Home / Self Care | Payer: Medicare Other | Source: Ambulatory Visit | Attending: Internal Medicine | Admitting: Internal Medicine

## 2013-10-26 DIAGNOSIS — M542 Cervicalgia: Secondary | ICD-10-CM | POA: Insufficient documentation

## 2013-10-26 DIAGNOSIS — Z923 Personal history of irradiation: Secondary | ICD-10-CM | POA: Insufficient documentation

## 2013-10-26 DIAGNOSIS — K8689 Other specified diseases of pancreas: Secondary | ICD-10-CM | POA: Insufficient documentation

## 2013-10-26 DIAGNOSIS — C8190 Hodgkin lymphoma, unspecified, unspecified site: Secondary | ICD-10-CM

## 2013-10-26 DIAGNOSIS — M8560 Other cyst of bone, unspecified site: Secondary | ICD-10-CM | POA: Insufficient documentation

## 2013-10-26 DIAGNOSIS — I2510 Atherosclerotic heart disease of native coronary artery without angina pectoris: Secondary | ICD-10-CM | POA: Insufficient documentation

## 2013-10-26 DIAGNOSIS — I700 Atherosclerosis of aorta: Secondary | ICD-10-CM | POA: Insufficient documentation

## 2013-10-26 DIAGNOSIS — N400 Enlarged prostate without lower urinary tract symptoms: Secondary | ICD-10-CM | POA: Insufficient documentation

## 2013-10-26 DIAGNOSIS — I6529 Occlusion and stenosis of unspecified carotid artery: Secondary | ICD-10-CM | POA: Insufficient documentation

## 2013-10-26 DIAGNOSIS — I658 Occlusion and stenosis of other precerebral arteries: Secondary | ICD-10-CM | POA: Insufficient documentation

## 2013-10-26 DIAGNOSIS — Z9221 Personal history of antineoplastic chemotherapy: Secondary | ICD-10-CM | POA: Insufficient documentation

## 2013-10-26 DIAGNOSIS — K4090 Unilateral inguinal hernia, without obstruction or gangrene, not specified as recurrent: Secondary | ICD-10-CM | POA: Insufficient documentation

## 2013-10-26 LAB — COMPREHENSIVE METABOLIC PANEL (CC13)
ALT: 9 U/L (ref 0–55)
AST: 10 U/L (ref 5–34)
Albumin: 3.8 g/dL (ref 3.5–5.0)
Alkaline Phosphatase: 64 U/L (ref 40–150)
Anion Gap: 9 mEq/L (ref 3–11)
BUN: 17.8 mg/dL (ref 7.0–26.0)
CO2: 28 mEq/L (ref 22–29)
Calcium: 9.5 mg/dL (ref 8.4–10.4)
Chloride: 101 mEq/L (ref 98–109)
Creatinine: 1.1 mg/dL (ref 0.7–1.3)
Glucose: 182 mg/dl — ABNORMAL HIGH (ref 70–140)
Potassium: 4.3 mEq/L (ref 3.5–5.1)
Sodium: 137 mEq/L (ref 136–145)
Total Bilirubin: 0.56 mg/dL (ref 0.20–1.20)
Total Protein: 6.2 g/dL — ABNORMAL LOW (ref 6.4–8.3)

## 2013-10-26 LAB — CBC WITH DIFFERENTIAL/PLATELET
BASO%: 0.3 % (ref 0.0–2.0)
Basophils Absolute: 0 10*3/uL (ref 0.0–0.1)
EOS%: 3.5 % (ref 0.0–7.0)
Eosinophils Absolute: 0.1 10*3/uL (ref 0.0–0.5)
HCT: 35.3 % — ABNORMAL LOW (ref 38.4–49.9)
HGB: 11.4 g/dL — ABNORMAL LOW (ref 13.0–17.1)
LYMPH%: 27.7 % (ref 14.0–49.0)
MCH: 27 pg — ABNORMAL LOW (ref 27.2–33.4)
MCHC: 32.4 g/dL (ref 32.0–36.0)
MCV: 83.3 fL (ref 79.3–98.0)
MONO#: 0.2 10*3/uL (ref 0.1–0.9)
MONO%: 9.2 % (ref 0.0–14.0)
NEUT#: 1.4 10*3/uL — ABNORMAL LOW (ref 1.5–6.5)
NEUT%: 59.3 % (ref 39.0–75.0)
Platelets: 118 10*3/uL — ABNORMAL LOW (ref 140–400)
RBC: 4.24 10*6/uL (ref 4.20–5.82)
RDW: 14.9 % — ABNORMAL HIGH (ref 11.0–14.6)
WBC: 2.4 10*3/uL — ABNORMAL LOW (ref 4.0–10.3)
lymph#: 0.7 10*3/uL — ABNORMAL LOW (ref 0.9–3.3)

## 2013-10-26 LAB — LACTATE DEHYDROGENASE (CC13): LDH: 127 U/L (ref 125–245)

## 2013-10-26 MED ORDER — IOHEXOL 300 MG/ML  SOLN
100.0000 mL | Freq: Once | INTRAMUSCULAR | Status: AC | PRN
  Administered 2013-10-26: 100 mL via INTRAVENOUS

## 2013-10-28 ENCOUNTER — Encounter: Payer: Self-pay | Admitting: Internal Medicine

## 2013-10-28 ENCOUNTER — Ambulatory Visit (HOSPITAL_BASED_OUTPATIENT_CLINIC_OR_DEPARTMENT_OTHER): Payer: Medicare Other | Admitting: Internal Medicine

## 2013-10-28 VITALS — BP 126/88 | HR 66 | Temp 97.4°F | Resp 20 | Ht 68.0 in | Wt 234.8 lb

## 2013-10-28 DIAGNOSIS — R0609 Other forms of dyspnea: Secondary | ICD-10-CM

## 2013-10-28 DIAGNOSIS — C8190 Hodgkin lymphoma, unspecified, unspecified site: Secondary | ICD-10-CM

## 2013-10-28 DIAGNOSIS — R0989 Other specified symptoms and signs involving the circulatory and respiratory systems: Secondary | ICD-10-CM

## 2013-10-28 NOTE — Patient Instructions (Signed)
Followup visit in 6 months with repeat CT scan of the neck, chest, abdomen and pelvis.

## 2013-10-28 NOTE — Progress Notes (Signed)
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   PRIOR THERAPY:  1. Status post 6 cycles of systemic chemotherapy with ABVD. Last dose was given January 12, 2009. 2. Status post palliative radiotherapy to the recurrent disease in the neck under the care of Dr. Tammi Klippel, completed January 21, 2011.  CURRENT THERAPY: Observation.   INTERVAL HISTORY: Cody Wood 68 y.o. male returns to the clinic today for six-month followup visit. The patient is feeling fine today with no specific complaints. He keep gaining weight since he is not doing much these days and staying most of the time at home. The patient denied having any significant chest pain but continues to have shortness of breath with exertion. He has no cough or hemoptysis. He denied having any significant peripheral lymphadenopathy. He has no fever or chills, no nausea or vomiting. The patient has repeat CT scan of the neck, chest, abdomen and pelvis performed recently and he is here for evaluation and discussion of his scan results.   MEDICAL HISTORY: Past Medical History  Diagnosis Date  . Diabetes mellitus   . Blockage of coronary artery of heart   . Hypertension   . hodgkins lymphoma dx'd 06/2008    ALLERGIES:  has No Known Allergies.  MEDICATIONS:  Current Outpatient Prescriptions  Medication Sig Dispense Refill  . aspirin 325 MG tablet Take 325 mg by mouth daily.      . carvedilol (COREG) 3.125 MG tablet Take 3.125 mg by mouth 2 (two) times daily with a meal.        . insulin glargine (LANTUS) 100 UNIT/ML injection Inject 14 Units into the skin at bedtime.       . metFORMIN (GLUCOPHAGE) 500 MG tablet Take 500 mg by mouth 2 (two) times daily with a meal.      . pioglitazone (ACTOS) 45 MG tablet Take  45 mg by mouth daily.        . rosuvastatin (CRESTOR) 20 MG tablet Take 20 mg by mouth daily.        Marland Kitchen UNABLE TO FIND BP med, pt unsure of name and dose       No current facility-administered medications for this visit.    SURGICAL HISTORY:  Past Surgical History  Procedure Laterality Date  . Knee surgery    . Mandible surgery    . Neck surgery      REVIEW OF SYSTEMS:  A comprehensive review of systems was negative except for: Respiratory: positive for dyspnea on exertion   PHYSICAL EXAMINATION: General appearance: alert, cooperative and no distress Head: Normocephalic, without obvious abnormality, atraumatic Neck: no adenopathy Lymph nodes: Cervical, supraclavicular, and axillary nodes normal. Resp: clear to auscultation bilaterally Cardio: regular rate and rhythm, S1, S2 normal, no murmur, click, rub or gallop GI: soft, non-tender; bowel sounds normal; no masses,  no organomegaly Extremities: extremities normal, atraumatic, no cyanosis or edema  ECOG PERFORMANCE STATUS: 1 - Symptomatic but completely ambulatory  Blood pressure 126/88, pulse 66, temperature 97.4 F (36.3 C), temperature source Oral, resp. rate 20, height 5\' 8"  (1.727 m), weight 234 lb 12.8 oz (106.505 kg), SpO2 100.00%.  LABORATORY DATA: Lab Results  Component Value Date   WBC 2.4* 10/26/2013   HGB 11.4* 10/26/2013   HCT 35.3* 10/26/2013  MCV 83.3 10/26/2013   PLT 118* 10/26/2013      Chemistry      Component Value Date/Time   NA 137 10/26/2013 1021   NA 143 04/27/2012 1029   NA 138 10/24/2011 0935   K 4.3 10/26/2013 1021   K 4.4 04/27/2012 1029   K 4.8 10/24/2011 0935   CL 101 10/27/2012 1045   CL 98 04/27/2012 1029   CL 100 10/24/2011 0935   CO2 28 10/26/2013 1021   CO2 28 04/27/2012 1029   CO2 27 10/24/2011 0935   BUN 17.8 10/26/2013 1021   BUN 18 04/27/2012 1029   BUN 12 10/24/2011 0935   CREATININE 1.1 10/26/2013 1021   CREATININE 0.9 04/27/2012 1029   CREATININE 1.04 10/24/2011 0935      Component  Value Date/Time   CALCIUM 9.5 10/26/2013 1021   CALCIUM 9.7 04/27/2012 1029   CALCIUM 9.7 10/24/2011 0935   ALKPHOS 64 10/26/2013 1021   ALKPHOS 78 04/27/2012 1029   ALKPHOS 71 10/24/2011 0935   AST 10 10/26/2013 1021   AST 19 04/27/2012 1029   AST 16 10/24/2011 0935   ALT 9 10/26/2013 1021   ALT 21 04/27/2012 1029   ALT 14 10/24/2011 0935   BILITOT 0.56 10/26/2013 1021   BILITOT 0.70 04/27/2012 1029   BILITOT 0.5 10/24/2011 0935       RADIOGRAPHIC STUDIES: Ct Soft Tissue Neck W Contrast  10/26/2013   CLINICAL DATA:  History of Hodgkin's lymphoma. Diagnosed in 2009. Left-sided neck pain.  EXAM: CT NECK WITH CONTRAST  TECHNIQUE: Multidetector CT imaging of the neck was performed using the standard protocol following the bolus administration of intravenous contrast.  CONTRAST:  138mL OMNIPAQUE IOHEXOL 300 MG/ML  SOLN  COMPARISON:  CT NECK W/CM dated 04/23/2013  FINDINGS: Limited intracranial imaging is unremarkable. Imaged portions of the orbits and globes are normal.  Normal nasopharynx, hypopharynx, and larynx. Minimal motion degradation at the level of the larynx.  The thyroid gland, submandibular glands, and parotid glands all enhance symmetrically. No cervical adenopathy. All vascular structures enhance normally. There is carotid atherosclerosis within the bulbs bilaterally.  Anterior cervical spine fixation.  IMPRESSION: No evidence of active lymphoma within the neck.   Electronically Signed   By: Abigail Miyamoto M.D.   On: 10/26/2013 15:38   Ct Chest W Contrast  10/26/2013   CLINICAL DATA:  Staging of Hodgkin's lymphoma. Diagnosed 06/2008. Chemotherapy and radiation therapy complete. Neck pain.  EXAM: CT CHEST, ABDOMEN, AND PELVIS WITH CONTRAST  TECHNIQUE: Multidetector CT imaging of the chest, abdomen and pelvis was performed following the standard protocol during bolus administration of intravenous contrast.  CONTRAST:  183mL OMNIPAQUE IOHEXOL 300 MG/ML  SOLN  COMPARISON:  CT CHEST W/CM dated 04/23/2013;  CT NECK W/CM dated 10/26/2013; CT ABD/PELVIS W CM dated 04/27/2012  FINDINGS:   CT CHEST FINDINGS  Lungs/Pleura: Resolved subsegmental atelectasis at the right lung base. The nodularity described on the prior exam is also a resolved.  There is minimal volume loss and subsegmental atelectasis at the left lung base. This is unchanged. No pleural fluid.  Heart/Mediastinum: No axillary adenopathy. Atherosclerosis, including within the aorta and coronary arteries. Normal heart size, without pericardial effusion. No central pulmonary embolism, on this non-dedicated study. No mediastinal or hilar adenopathy.    CT ABDOMEN AND PELVIS FINDINGS  Abdomen/Pelvis: Normal liver, spleen. Gastric underdistention. Moderate pancreatic atrophy. A pancreatic body 9 mm hypo attenuating focus on image 61/series 2 is likely similar to on image 62/series  2 of the prior exam.  Normal gallbladder, biliary tract, adrenal glands, kidneys. No retroperitoneal or retrocrural adenopathy. Normal large and small bowel loops. No ascites. No evidence of omental or peritoneal disease. Fat containing left inguinal hernia. No pelvic adenopathy. Normal urinary bladder. Mild prostatomegaly. No significant free fluid.  Presumed sebaceous cyst within the right anterior pelvic subcutaneous tissues at 9 mm on image 103. This is similar.  Bones/Musculoskeletal: Degenerative cyst in the proximal left femur.   IMPRESSION: CT CHEST IMPRESSION  1. No acute process or evidence of active lymphoma within the chest. 2. Atherosclerosis, including within the coronary arteries.  CT ABDOMEN AND PELVIS IMPRESSION  1. No acute process or evidence of active lymphoma within the abdomen or pelvis. 2. Pancreatic atrophy with similar appearance of a 9 mm cystic lesion within the pancreatic body. Favor a small pseudocyst. This could be re-evaluated at followup.   Electronically Signed   By: Abigail Miyamoto M.D.   On: 10/26/2013 15:33    ASSESSMENT AND PLAN: This is a very  pleasant 67 years old white male with history of recurrent Hodgkin's lymphoma last treatment with with palliative radiation in April 2012 and the patient has been observation since that time was no evidence for disease recurrence. I discussed the scan results with the patient today. I recommended for him to continue on observation with repeat CT scan of the neck, chest, abdomen and pelvis in 6 months. He was advised to call immediately if he has any concerning symptoms in the interval.  The patient voices understanding of current disease status and treatment options and is in agreement with the current care plan.  All questions were answered. The patient knows to call the clinic with any problems, questions or concerns. We can certainly see the patient much sooner if necessary.  Disclaimer: This note was dictated with voice recognition software. Similar sounding words can inadvertently be transcribed and may not be corrected upon review.

## 2013-10-29 ENCOUNTER — Telehealth: Payer: Self-pay | Admitting: Internal Medicine

## 2013-10-29 NOTE — Telephone Encounter (Signed)
s.w. pt and advised on July adn Aug appt...mailed pt appt sched, avs and letter

## 2013-12-04 ENCOUNTER — Emergency Department (HOSPITAL_COMMUNITY): Payer: Medicare Other

## 2013-12-04 ENCOUNTER — Encounter (HOSPITAL_COMMUNITY): Payer: Self-pay | Admitting: Emergency Medicine

## 2013-12-04 ENCOUNTER — Emergency Department (HOSPITAL_COMMUNITY)
Admission: EM | Admit: 2013-12-04 | Discharge: 2013-12-04 | Disposition: A | Discharge status: Home / Self Care | Payer: Medicare Other | Attending: Emergency Medicine | Admitting: Emergency Medicine

## 2013-12-04 DIAGNOSIS — Y9301 Activity, walking, marching and hiking: Secondary | ICD-10-CM | POA: Insufficient documentation

## 2013-12-04 DIAGNOSIS — W108XXA Fall (on) (from) other stairs and steps, initial encounter: Secondary | ICD-10-CM | POA: Insufficient documentation

## 2013-12-04 DIAGNOSIS — S40019A Contusion of unspecified shoulder, initial encounter: Secondary | ICD-10-CM | POA: Insufficient documentation

## 2013-12-04 DIAGNOSIS — Z7982 Long term (current) use of aspirin: Secondary | ICD-10-CM | POA: Insufficient documentation

## 2013-12-04 DIAGNOSIS — Y929 Unspecified place or not applicable: Secondary | ICD-10-CM | POA: Insufficient documentation

## 2013-12-04 DIAGNOSIS — E119 Type 2 diabetes mellitus without complications: Secondary | ICD-10-CM | POA: Insufficient documentation

## 2013-12-04 DIAGNOSIS — Z794 Long term (current) use of insulin: Secondary | ICD-10-CM | POA: Insufficient documentation

## 2013-12-04 DIAGNOSIS — S40012A Contusion of left shoulder, initial encounter: Secondary | ICD-10-CM

## 2013-12-04 DIAGNOSIS — Z859 Personal history of malignant neoplasm, unspecified: Secondary | ICD-10-CM | POA: Insufficient documentation

## 2013-12-04 DIAGNOSIS — Z79899 Other long term (current) drug therapy: Secondary | ICD-10-CM | POA: Insufficient documentation

## 2013-12-04 DIAGNOSIS — S161XXA Strain of muscle, fascia and tendon at neck level, initial encounter: Secondary | ICD-10-CM

## 2013-12-04 DIAGNOSIS — S139XXA Sprain of joints and ligaments of unspecified parts of neck, initial encounter: Secondary | ICD-10-CM | POA: Insufficient documentation

## 2013-12-04 DIAGNOSIS — I10 Essential (primary) hypertension: Secondary | ICD-10-CM | POA: Insufficient documentation

## 2013-12-04 MED ORDER — NAPROXEN 500 MG PO TABS: 500.0000 mg | ORAL_TABLET | Freq: Two times a day (BID) | ORAL | Status: DC

## 2013-12-04 MED ORDER — NAPROXEN 250 MG PO TABS
500.0000 mg | ORAL_TABLET | Freq: Once | ORAL | Status: AC
  Administered 2013-12-04: 500 mg via ORAL
  Filled 2013-12-04: qty 2

## 2013-12-04 MED ORDER — METHOCARBAMOL 500 MG PO TABS: 500.0000 mg | ORAL_TABLET | Freq: Two times a day (BID) | ORAL | Status: DC | PRN

## 2013-12-04 NOTE — Discharge Instructions (Signed)
Your x-ray show no signs of fractures or dislocations, followup with your doctor, use the medications as prescribed  Please call your doctor for a followup appointment within 24-48 hours. When you talk to your doctor please let them know that you were seen in the emergency department and have them acquire all of your records so that they can discuss the findings with you and formulate a treatment plan to fully care for your new and ongoing problems.

## 2013-12-04 NOTE — ED Notes (Signed)
Patient states he tripped over his feet and fell.  Patient c/o neck pain.

## 2013-12-04 NOTE — ED Provider Notes (Signed)
CSN: [id number]     Arrival date & time 12/04/13  0141 History   First MD Initiated Contact with Patient 12/04/13 0425     Chief Complaint  Patient presents with  . Fall     (Consider location/radiation/quality/duration/timing/severity/associated sxs/prior Treatment) HPI Comments: The patient states that several hours prior to arrival the patient was walking down his front steps, he got caught on his own feet and fell on his left side landing on the left shoulder. This was acute in onset, his pain was gradual in onset after the fall and has been persistent with left-sided neck pain as well as left-sided shoulder pain. This is worse with range of motion and palpation, not associated with head injury, loss of consciousness or difficulty ambulating. He was placed in a cervical collar on arrival. He denies any numbness or weakness of his upper or lower extremity  Patient is a 68 y.o. male presenting with fall. The history is provided by the patient.  Fall Pertinent negatives include no headaches.    Past Medical History  Diagnosis Date  . Diabetes mellitus   . Blockage of coronary artery of heart   . Hypertension   . hodgkins lymphoma dx'd 06/2008   Past Surgical History  Procedure Laterality Date  . Knee surgery    . Mandible surgery    . Neck surgery     No family history on file. History  Substance Use Topics  . Smoking status: Never Smoker   . Smokeless tobacco: Current User    Types: Chew  . Alcohol Use: No    Review of Systems  Musculoskeletal: Positive for neck pain. Negative for back pain.  Neurological: Negative for weakness, numbness and headaches.      Allergies  Review of patient's allergies indicates no known allergies.  Home Medications   Current Outpatient Rx  Name  Route  Sig  Dispense  Refill  . aspirin 325 MG tablet   Oral   Take 325 mg by mouth daily.         . carvedilol (COREG) 3.125 MG tablet   Oral   Take 3.125 mg by mouth 2 (two) times  daily with a meal.           . insulin glargine (LANTUS) 100 UNIT/ML injection   Subcutaneous   Inject 14 Units into the skin at bedtime.          . metFORMIN (GLUCOPHAGE) 500 MG tablet   Oral   Take 500 mg by mouth 2 (two) times daily with a meal.         . pioglitazone (ACTOS) 45 MG tablet   Oral   Take 45 mg by mouth daily.           . rosuvastatin (CRESTOR) 20 MG tablet   Oral   Take 20 mg by mouth daily.           Marland Kitchen UNABLE TO FIND      BP med, pt unsure of name and dose         . methocarbamol (ROBAXIN) 500 MG tablet   Oral   Take 1 tablet (500 mg total) by mouth 2 (two) times daily as needed for muscle spasms.   20 tablet   0   . naproxen (NAPROSYN) 500 MG tablet   Oral   Take 1 tablet (500 mg total) by mouth 2 (two) times daily with a meal.   30 tablet   0    BP 139/62  Pulse 71  Temp(Src) 98.5 F (36.9 C) (Oral)  Resp 20  Ht 5\' 8"  (1.727 m)  Wt 230 lb (104.327 kg)  BMI 34.98 kg/m2  SpO2 96% Physical Exam  Nursing note and vitals reviewed. Constitutional: He appears well-developed and well-nourished.  HENT:  Head: Normocephalic and atraumatic.  No signs of head injury  Eyes: Conjunctivae are normal. No scleral icterus.  Cardiovascular: Normal rate, regular rhythm and intact distal pulses.   Pulmonary/Chest: Effort normal and breath sounds normal.  Abdominal: Soft.  No pulsating masses, no guarding, no tenderness  Musculoskeletal: He exhibits tenderness ( Local tenderness to the left neck muscles, no spinal tenderness).  No spinal tenderness of the cervical, thoracic or lumbar spines  Tenderness over the left shoulder girdle, preserved range of motion of the left shoulder actively against resistance in all range of motion  Neurological: He is alert.  Normal gait, normal strength of the upper and lower extremities, normal sensation of the upper extremities  Skin: Skin is warm and dry. No erythema.    ED Course  Procedures (including  critical care time) Labs Review Labs Reviewed - No data to display Imaging Review Dg Cervical Spine Complete  12/04/2013   CLINICAL DATA:  Neck and left shoulder pain after a fall.  EXAM: CERVICAL SPINE  4+ VIEWS  COMPARISON:  CT neck for lymphoma 10/26/2013.  FINDINGS: There is no evidence of cervical spine fracture or prevertebral soft tissue swelling. Alignment is normal. No other significant bone abnormalities are identified.  The patient has undergone previous cervical fusion from C3 through C7. There is instrumentation from C3 through C6. Proliferative change is seen anterior to the C2-C3 interspace. There is advanced facet arthropathy at C2-C3. Within limits of visualization on plain films, No visible cervicothoracic abnormality although portions of the C7 and T1 vertebrae are obscured by ribs. No malalignment is evident. Osseous demineralization. Negative odontoid. Left-sided surgical clips may reflect previous endarterectomy.  IMPRESSION: Within limits of evaluation on plain films, no visible fracture or traumatic subluxation.   Electronically Signed   By: Rolla Flatten M.D.   On: 12/04/2013 02:50   Dg Shoulder Left  12/04/2013   CLINICAL DATA:  Left-sided neck and shoulder pain after a fall.  EXAM: LEFT SHOULDER - 2+ VIEW  COMPARISON:  None.  FINDINGS: There is no evidence of fracture or dislocation. There is no evidence of arthropathy or other focal bone abnormality. Soft tissues are unremarkable. Moderate AC joint narrowing of a chronic nature without separation. Adjacent ribs intact.  IMPRESSION: No acute findings.   Electronically Signed   By: Rolla Flatten M.D.   On: 12/04/2013 02:47     EKG Interpretation None      MDM   Final diagnoses:  Cervical strain, acute  Contusion of left shoulder    Well-appearing, cervical strain with contusion of the left shoulder, imaging reviewed showing no signs of fractures or dislocations, no neurologic symptoms, the patient will be given  anti-inflammatories and muscle relaxants and appears stable for discharge.   Meds given in ED:  Medications  naproxen (NAPROSYN) tablet 500 mg (not administered)    New Prescriptions   METHOCARBAMOL (ROBAXIN) 500 MG TABLET    Take 1 tablet (500 mg total) by mouth 2 (two) times daily as needed for muscle spasms.   NAPROXEN (NAPROSYN) 500 MG TABLET    Take 1 tablet (500 mg total) by mouth 2 (two) times daily with a meal.        Mike Craze  Sabra Heck, MD 12/04/13 [phone]

## 2013-12-04 NOTE — ED Notes (Signed)
Discharge instructions and prescriptions given and reviewed with patient.  Patient verbalized understanding to take medications as directed and to follow up with PMD as needed.  Patient ambulatory; discharged home in good condition. 

## 2013-12-21 ENCOUNTER — Emergency Department (HOSPITAL_COMMUNITY)
Admission: EM | Admit: 2013-12-21 | Discharge: 2013-12-21 | Disposition: A | Discharge status: Home / Self Care | Payer: Medicare Other | Attending: Emergency Medicine | Admitting: Emergency Medicine

## 2013-12-21 ENCOUNTER — Encounter (HOSPITAL_COMMUNITY): Payer: Self-pay | Admitting: Emergency Medicine

## 2013-12-21 ENCOUNTER — Emergency Department (HOSPITAL_COMMUNITY): Payer: Medicare Other

## 2013-12-21 DIAGNOSIS — Y9389 Activity, other specified: Secondary | ICD-10-CM | POA: Insufficient documentation

## 2013-12-21 DIAGNOSIS — Z7982 Long term (current) use of aspirin: Secondary | ICD-10-CM | POA: Insufficient documentation

## 2013-12-21 DIAGNOSIS — IMO0002 Reserved for concepts with insufficient information to code with codable children: Secondary | ICD-10-CM | POA: Insufficient documentation

## 2013-12-21 DIAGNOSIS — Z79899 Other long term (current) drug therapy: Secondary | ICD-10-CM | POA: Insufficient documentation

## 2013-12-21 DIAGNOSIS — E119 Type 2 diabetes mellitus without complications: Secondary | ICD-10-CM | POA: Insufficient documentation

## 2013-12-21 DIAGNOSIS — I10 Essential (primary) hypertension: Secondary | ICD-10-CM | POA: Insufficient documentation

## 2013-12-21 DIAGNOSIS — W010XXA Fall on same level from slipping, tripping and stumbling without subsequent striking against object, initial encounter: Secondary | ICD-10-CM | POA: Insufficient documentation

## 2013-12-21 DIAGNOSIS — Z8571 Personal history of Hodgkin lymphoma: Secondary | ICD-10-CM | POA: Insufficient documentation

## 2013-12-21 DIAGNOSIS — I2510 Atherosclerotic heart disease of native coronary artery without angina pectoris: Secondary | ICD-10-CM | POA: Insufficient documentation

## 2013-12-21 DIAGNOSIS — Z9889 Other specified postprocedural states: Secondary | ICD-10-CM | POA: Insufficient documentation

## 2013-12-21 DIAGNOSIS — Z794 Long term (current) use of insulin: Secondary | ICD-10-CM | POA: Insufficient documentation

## 2013-12-21 DIAGNOSIS — Y92009 Unspecified place in unspecified non-institutional (private) residence as the place of occurrence of the external cause: Secondary | ICD-10-CM | POA: Insufficient documentation

## 2013-12-21 DIAGNOSIS — S42213A Unspecified displaced fracture of surgical neck of unspecified humerus, initial encounter for closed fracture: Secondary | ICD-10-CM | POA: Insufficient documentation

## 2013-12-21 LAB — COMPREHENSIVE METABOLIC PANEL
ALT: 10 U/L (ref 0–53)
AST: 13 U/L (ref 0–37)
Albumin: 4.1 g/dL (ref 3.5–5.2)
Alkaline Phosphatase: 78 U/L (ref 39–117)
BUN: 21 mg/dL (ref 6–23)
CO2: 26 mEq/L (ref 19–32)
Calcium: 10 mg/dL (ref 8.4–10.5)
Chloride: 100 mEq/L (ref 96–112)
Creatinine, Ser: 0.86 mg/dL (ref 0.50–1.35)
GFR calc Af Amer: >90 mL/min (ref >90)
GFR calc non Af Amer: 87 mL/min — ABNORMAL LOW (ref >90)
Glucose, Bld: 160 mg/dL — ABNORMAL HIGH (ref 70–99)
Potassium: 4.2 mEq/L (ref 3.7–5.3)
Sodium: 140 mEq/L (ref 137–147)
Total Bilirubin: 0.4 mg/dL (ref 0.3–1.2)
Total Protein: 7.1 g/dL (ref 6.0–8.3)

## 2013-12-21 LAB — CBC WITH DIFFERENTIAL/PLATELET
Basophils Absolute: 0 10*3/uL (ref 0.0–0.1)
Basophils Relative: 0 % (ref 0–1)
Eosinophils Absolute: 0.1 10*3/uL (ref 0.0–0.7)
Eosinophils Relative: 3 % (ref 0–5)
HCT: 37.8 % — ABNORMAL LOW (ref 39.0–52.0)
Hemoglobin: 12.4 g/dL — ABNORMAL LOW (ref 13.0–17.0)
Lymphocytes Relative: 21 % (ref 12–46)
Lymphs Abs: 0.7 10*3/uL (ref 0.7–4.0)
MCH: 27.2 pg (ref 26.0–34.0)
MCHC: 32.8 g/dL (ref 30.0–36.0)
MCV: 82.9 fL (ref 78.0–100.0)
Monocytes Absolute: 0.2 10*3/uL (ref 0.1–1.0)
Monocytes Relative: 7 % (ref 3–12)
Neutro Abs: 2.2 10*3/uL (ref 1.7–7.7)
Neutrophils Relative %: 70 % (ref 43–77)
Platelets: 128 10*3/uL — ABNORMAL LOW (ref 150–400)
RBC: 4.56 MIL/uL (ref 4.22–5.81)
RDW: 14.4 % (ref 11.5–15.5)
WBC: 3.2 10*3/uL — ABNORMAL LOW (ref 4.0–10.5)

## 2013-12-21 LAB — TROPONIN I
Troponin I: <0.3 ng/mL (ref <0.30)
Troponin I: <0.3 ng/mL (ref <0.30)

## 2013-12-21 LAB — PROTIME-INR
INR: 1.05 (ref 0.00–1.49)
Prothrombin Time: 13.5 seconds (ref 11.6–15.2)

## 2013-12-21 MED ORDER — MORPHINE SULFATE 4 MG/ML IJ SOLN
4.0000 mg | Freq: Once | INTRAMUSCULAR | Status: AC
  Administered 2013-12-21: 4 mg via INTRAVENOUS
  Filled 2013-12-21: qty 1

## 2013-12-21 MED ORDER — HYDROCODONE-ACETAMINOPHEN 5-325 MG PO TABS: 2.0000 | ORAL_TABLET | ORAL | Status: DC | PRN

## 2013-12-21 MED ORDER — HYDROCODONE-ACETAMINOPHEN 5-325 MG PO TABS
2.0000 | ORAL_TABLET | Freq: Once | ORAL | Status: AC
  Administered 2013-12-21: 2 via ORAL
  Filled 2013-12-21: qty 2

## 2013-12-21 NOTE — ED Notes (Signed)
Patient given discharge instruction, verbalized understand. IV removed, band aid applied. Patient in wheelchair out of the department.  

## 2013-12-21 NOTE — Discharge Instructions (Signed)
Humerus Fracture, Treated with Immobilization Follow up with Dr. Aline Brochure on Thursday.  Return to the ED if you develop new or worsening symptoms. The humerus is the large bone in your upper arm. You have a broken (fractured) humerus. These fractures are easily diagnosed with X-rays. TREATMENT  Simple fractures which will heal without disability are treated with simple immobilization. Immobilization means you will wear a cast, splint, or sling. You have a fracture which will do well with immobilization. The fracture will heal well simply by being held in a good position until it is stable enough to begin range of motion exercises. Do not take part in activities which would further injure your arm.  HOME CARE INSTRUCTIONS   Put ice on the injured area.  Put ice in a plastic bag.  Place a towel between your skin and the bag.  Leave the ice on for 15-20 minutes, 03-04 times a day.  If you have a cast:  Do not scratch the skin under the cast using sharp or pointed objects.  Check the skin around the cast every day. You may put lotion on any red or sore areas.  Keep your cast dry and clean.  If you have a splint:  Wear the splint as directed.  Keep your splint dry and clean.  You may loosen the elastic around the splint if your fingers become numb, tingle, or turn cold or blue.  If you have a sling:  Wear the sling as directed.  Do not put pressure on any part of your cast or splint until it is fully hardened.  Your cast or splint can be protected during bathing with a plastic bag. Do not lower the cast or splint into water.  Only take over-the-counter or prescription medicines for pain, discomfort, or fever as directed by your caregiver.  Do range of motion exercises as instructed by your caregiver.  Follow up as directed by your caregiver. This is very important in order to avoid permanent injury or disability and chronic pain. SEEK IMMEDIATE MEDICAL CARE IF:   Your skin or  nails in the injured arm turn blue or gray.  Your arm feels cold or numb.  You develop severe pain in the injured arm.  You are having problems with the medicines you were given. MAKE SURE YOU:   Understand these instructions.  Will watch your condition.  Will get help right away if you are not doing well or get worse. Document Released: 12/23/2000 Document Revised: 12/09/2011 Document Reviewed: 10/31/2010 [facility] Patient Information 2014 [place].

## 2013-12-21 NOTE — ED Notes (Signed)
Patient states he fell and is complaining of right shoulder and elbow pain. States he tripped over a rug.

## 2013-12-21 NOTE — ED Provider Notes (Signed)
CSN: [id number]     Arrival date & time 12/21/13  1648 History  This chart was scribed for Cody Essex, MD by Jenne Campus, ED Scribe. This patient was seen in room APA07/APA07 and the patient's care was started at 4:57 PM.   Chief Complaint  Patient presents with  . Fall  . Shoulder Pain  . Arm Pain     The history is provided by the patient. No language interpreter was used.    HPI Comments: Cody Wood is a 68 y.o. male who presents to the Emergency Department complaining of a trip and fall this afternoon. Pt states that he tripped over a throw rug in his house and fell face forward landing on both his elbows. He states that the right elbow took most of the brunt of the fall. He denies an preceeding any dizziness or lightheadedness. He denies head trauma or LOC. He c/o right shoulder pain currently. He reports feeling nauseated at the time but denies any emesis. He also reports approximately 10 minutes of substernal CP after the fall which he attributes to falling on his left hand. He denies any radiation. He has a h/o CAD but denies any h/o prior MI or cardiac stent placement. He denies any prior similar CP and denies similarities to prior cardiac related CP. He denies any neck pain, back pain or abdominal pain.   Past Medical History  Diagnosis Date  . Diabetes mellitus   . Blockage of coronary artery of heart   . Hypertension   . hodgkins lymphoma dx'd 06/2008   Past Surgical History  Procedure Laterality Date  . Knee surgery    . Mandible surgery    . Neck surgery     History reviewed. No pertinent family history. History  Substance Use Topics  . Smoking status: Never Smoker   . Smokeless tobacco: Current User    Types: Chew  . Alcohol Use: No    Review of Systems  A complete 10 system review of systems was obtained and all systems are negative except as noted in the HPI and PMH.    Allergies  Review of patient's allergies indicates no known  allergies.  Home Medications   Current Outpatient Rx  Name  Route  Sig  Dispense  Refill  . aspirin 325 MG tablet   Oral   Take 325 mg by mouth daily.         . carvedilol (COREG) 3.125 MG tablet   Oral   Take 3.125 mg by mouth 2 (two) times daily with a meal.           . insulin glargine (LANTUS) 100 UNIT/ML injection   Subcutaneous   Inject 21 Units into the skin at bedtime. If levels are below 120, take only 20 units         . losartan (COZAAR) 50 MG tablet   Oral   Take 50 mg by mouth daily.         Marland Kitchen lovastatin (MEVACOR) 20 MG tablet   Oral   Take 20 mg by mouth every morning.          . metFORMIN (GLUCOPHAGE) 500 MG tablet   Oral   Take 500 mg by mouth 2 (two) times daily with a meal.         . HYDROcodone-acetaminophen (NORCO/VICODIN) 5-325 MG per tablet   Oral   Take 2 tablets by mouth every 4 (four) hours as needed.   10 tablet   0  Triage Vitals: BP 152/81  Pulse 66  Temp(Src) 98.3 F (36.8 C) (Oral)  Resp 20  Ht 5\' 8"  (1.727 m)  Wt 230 lb (104.327 kg)  BMI 34.98 kg/m2  SpO2 97%  Physical Exam  Nursing note and vitals reviewed. Constitutional: He is oriented to person, place, and time. He appears well-developed and well-nourished. No distress.  HENT:  Head: Normocephalic and atraumatic.  No malocclusion or trismus.    Eyes: EOM are normal.  Neck: Neck supple. No tracheal deviation present.  No midline c-spine  Cardiovascular: Normal rate and regular rhythm.   Pulmonary/Chest: Effort normal and breath sounds normal. No respiratory distress. He exhibits no tenderness.  Abdominal: Soft. There is no tenderness.  Musculoskeletal:  Tenderness over right anterior shoulder. Reduced ROM to right shoulder secondary to pain. No right elbow or wrist tenderness. Cardinal hand movements intact. Intact right radial pulse. No midline T or L spine tenderness. Hips are stable and non-tender. Bilateral legs are non-tender with FROM.    Neurological:  He is alert and oriented to person, place, and time.  CN 2-12 intact, no ataxia on finger to nose, no nystagmus, 5/5 strength throughout, no pronator drift, Romberg negative, normal gait.  Skin: Skin is warm and dry.  Abrasion to right dorsal elbow.   Psychiatric: He has a normal mood and affect. His behavior is normal.    ED Course  Procedures (including critical care time)  Medications  HYDROcodone-acetaminophen (NORCO/VICODIN) 5-325 MG per tablet 2 tablet (2 tablets Oral Given 12/21/13 1714)  morphine 4 MG/ML injection 4 mg (4 mg Intravenous Given 12/21/13 1923)    DIAGNOSTIC STUDIES: Oxygen Saturation is 97% on RA, adequate by my interpretation.    COORDINATION OF CARE: 5:03 PM-Discussed treatment plan which includes x-rays, CBC panel, CMP and troponin with pt at bedside and pt agreed to plan.   PMHx summarized: Pt had a 40% blockage of D1 noted on Cardiac Cath in 2012.   6:33 PM- Pt rechecked and denies any further CP. Denies any HA, neck pain, back pain or abdominal pain. Informed pt of proximal humerus fracture. NVI upon recheck.   7:07 PM-Consult complete with Dr. Aline Brochure, Orthopedist. Patient case explained and discussed. Dr. Aline Brochure agrees to treatment plan of sling and will follow up in office in 2 days. Call ended at 7:09 PM.  Labs Review Labs Reviewed  CBC WITH DIFFERENTIAL - Abnormal; Notable for the following:    WBC 3.2 (*)    Hemoglobin 12.4 (*)    HCT 37.8 (*)    Platelets 128 (*)    All other components within normal limits  COMPREHENSIVE METABOLIC PANEL - Abnormal; Notable for the following:    Glucose, Bld 160 (*)    GFR calc non Af Amer 87 (*)    All other components within normal limits  TROPONIN I  PROTIME-INR  TROPONIN I   Imaging Review Dg Chest 1 View  12/21/2013   CLINICAL DATA:  Fall.  Right shoulder and chest pain.  EXAM: CHEST - 1 VIEW  COMPARISON:  CT CHEST W/CM dated 10/26/2013  FINDINGS: The heart size is normal. The lungs are clear. Mild  emphysematous changes are suggested. Degenerative changes are noted in the shoulders. Postoperative changes are present in the cervical spine. The right humerus fracture is not well visualized on this film.  IMPRESSION: 1. Emphysema. 2. No acute cardiopulmonary disease. 3. Degenerative changes within the shoulders.   Electronically Signed   By: Lawrence Santiago M.D.   On:  12/21/2013 18:19   Dg Shoulder Right  12/21/2013   CLINICAL DATA:  Fall.  Severe right shoulder pain.  EXAM: RIGHT SHOULDER - 2+ VIEW  COMPARISON:  None available.  FINDINGS: A right humeral neck fracture is slightly impacted. The glenohumeral joint is intact. Scapula is intact. The adjacent hemi thorax is remarkable for mild emphysematous change. The patient is status post ACDF.  IMPRESSION: 1. A slightly impacted right humeral neck fracture. 2. No additional fractures. 3. Emphysema.   Electronically Signed   By: Lawrence Santiago M.D.   On: 12/21/2013 18:22   Dg Elbow 2 Views Right  12/21/2013   CLINICAL DATA:  Fall. Severe right shoulder pain. Right elbow pain.  EXAM: RIGHT ELBOW - 2 VIEW  COMPARISON:  None.  FINDINGS: Evaluation of the elbow is limited by up nonstandard projections. No displaced fracture or gross effusion is identified. Mild soft tissue stranding is present over the posterior aspect of the elbow, likely representing contusion. Insertional triceps enthesopathy.  IMPRESSION: Technically suboptimal will valuation because of positioning (proximal humerus fracture). No displaced fracture of the elbow.   Electronically Signed   By: Dereck Ligas M.D.   On: 12/21/2013 18:23   Dg Humerus Right  12/21/2013   CLINICAL DATA:  Fall with severe right shoulder pain.  EXAM: RIGHT HUMERUS - 2+ VIEW  COMPARISON:  None.  FINDINGS: Predominantly transverse fracture through the surgical neck of the proximal right humerus. There is no significant displacement. Apparent lucency through the greater tuberosity may reflect degenerative spurring  based on dedicated shoulder radiography. The glenohumeral and acromioclavicular joints are located. No mid or distal forearm fracture.  IMPRESSION: Acute, nondisplaced surgical neck fracture of the right humerus.   Electronically Signed   By: Jorje Guild M.D.   On: 12/21/2013 18:23     EKG Interpretation   Date/Time:  Tuesday December 21 2013 17:38:43 EDT Ventricular Rate:  60 PR Interval:  168 QRS Duration: 74 QT Interval:  416 QTC Calculation: 416 R Axis:   20 Text Interpretation:  Normal sinus rhythm Low voltage QRS Cannot rule out  Anterior infarct , age undetermined Abnormal ECG When compared with ECG of  25-Aug-2011 10:34, Minimal criteria for Anterior infarct are now Present  No significant change was found Confirmed by Wyvonnia Dusky  MD, Ronisha Herringshaw [phone])  on 12/21/2013 5:45:26 PM      MDM   Final diagnoses:  Humeral surgical neck fracture   mechanical fall with right shoulder and elbow pain. Did not hit head or lose consciousness. Reduced range of motion secondary to pain. Patient reports "10 minute episode of chest pain" which has since resolved. He thinks this was due to his arm pressing in his chest. The pain is dissimilar to his previous chest pain.  Right humeral neck fracture seen on x-rays. Neurovascularly intact. Discussed with Dr. Aline Brochure. He agrees with sling he will see patient in the office on March 26.  Catheterization 2012 showed nonobstructive coronary disease. Patient admitted that he had some chest pain after the fall which he thinks was due to the arm pressing against his chest. Low suspicion for ACS or PE.  It is dissimilar to his previous heart attack chest pain. Catheterization in 2012 as above. EKG is unchanged. Troponin is negative x2.  No chest pain in ED.  Patient will follow up with Dr. Aline Brochure on 3/26.  Return precautions discussed.    I personally performed the services described in this documentation, which was scribed in my presence. The recorded  information has been reviewed and is accurate.      Cody Essex, MD 12/21/13 2051

## 2013-12-23 ENCOUNTER — Encounter: Payer: Self-pay | Admitting: Orthopedic Surgery

## 2013-12-23 ENCOUNTER — Ambulatory Visit (INDEPENDENT_AMBULATORY_CARE_PROVIDER_SITE_OTHER): Payer: Medicare Other | Admitting: Orthopedic Surgery

## 2013-12-23 VITALS — BP 114/61 | Ht 68.0 in | Wt 230.0 lb

## 2013-12-23 DIAGNOSIS — S42209A Unspecified fracture of upper end of unspecified humerus, initial encounter for closed fracture: Secondary | ICD-10-CM

## 2013-12-23 DIAGNOSIS — S42201A Unspecified fracture of upper end of right humerus, initial encounter for closed fracture: Secondary | ICD-10-CM | POA: Insufficient documentation

## 2013-12-23 MED ORDER — HYDROCODONE-ACETAMINOPHEN 5-325 MG PO TABS: 120.0000 | ORAL_TABLET | ORAL | Status: DC | PRN

## 2013-12-23 NOTE — Patient Instructions (Signed)
Wear sling for 2- 3 weeks

## 2013-12-23 NOTE — Progress Notes (Signed)
   Subjective:    Patient ID: Cody Wood, male    DOB: 01-20-1946, 68 y.o.   MRN: [id number]  HPI Comments: [place] at home injuring her right proximal humerus treated at [facility]  Arm Pain  The incident occurred 2 days ago. The incident occurred at home. The injury mechanism was a fall. The pain is present in the right shoulder. The quality of the pain is described as aching. The pain does not radiate. The pain is at a severity of 9/10. The pain has been constant since the incident.      Review of Systems  Constitutional: Positive for chills.  Respiratory: Positive for cough.   Gastrointestinal: Positive for nausea and vomiting.  Endocrine: Positive for polydipsia, polyphagia and polyuria.  All other systems reviewed and are negative.       Objective:   Physical Exam BP 114/61  Ht 5\' 8"  (1.727 m)  Wt 230 lb (104.327 kg)  BMI 34.98 kg/m2  General appearance is normal, the patient is alert and oriented x3 with normal mood and affect.  Ambulation has not been affected.  His lower extremities show some mild peripheral edema and skin changes consistent with chronic venous stasis he has normal sensation in his upper and lower extremities. His axilla are devoid of lymphadenopathy has normal muscle tone bilaterally. His right shoulder approximately is tender slightly swollen and is loss of motion secondary to pain we could not do stability but the elbow wrist and hand stability were normal and motor exam is intact     Assessment & Plan:  X-ray show nondisplaced right proximal humerus fracture the included shoulder and humerus films and the films were excellent quality  Nondisplaced fracture treated with immobilization for 2-3 weeks an x-ray and then start therapy  Meds ordered this encounter  Medications  . HYDROcodone-acetaminophen (NORCO/VICODIN) 5-325 MG per tablet    Sig: Take 120 tablets by mouth every 4 (four) hours as needed.    Dispense:  120  tablet    Refill:  0

## 2014-01-20 ENCOUNTER — Ambulatory Visit (INDEPENDENT_AMBULATORY_CARE_PROVIDER_SITE_OTHER): Payer: Medicare Other

## 2014-01-20 ENCOUNTER — Other Ambulatory Visit: Payer: Self-pay | Admitting: *Deleted

## 2014-01-20 ENCOUNTER — Ambulatory Visit (INDEPENDENT_AMBULATORY_CARE_PROVIDER_SITE_OTHER): Payer: Self-pay | Admitting: Orthopedic Surgery

## 2014-01-20 VITALS — BP 128/75 | Ht 68.0 in | Wt 230.0 lb

## 2014-01-20 DIAGNOSIS — S42209A Unspecified fracture of upper end of unspecified humerus, initial encounter for closed fracture: Secondary | ICD-10-CM

## 2014-01-20 DIAGNOSIS — S4291XA Fracture of right shoulder girdle, part unspecified, initial encounter for closed fracture: Secondary | ICD-10-CM

## 2014-01-20 DIAGNOSIS — S42201A Unspecified fracture of upper end of right humerus, initial encounter for closed fracture: Secondary | ICD-10-CM

## 2014-01-20 MED ORDER — HYDROCODONE-ACETAMINOPHEN 5-325 MG PO TABS: 120.0000 | ORAL_TABLET | Freq: Four times a day (QID) | ORAL | Status: DC | PRN

## 2014-01-20 MED ORDER — HYDROCODONE-ACETAMINOPHEN 5-325 MG PO TABS: 1.0000 | ORAL_TABLET | Freq: Four times a day (QID) | ORAL | Status: DC | PRN

## 2014-01-20 NOTE — Patient Instructions (Signed)
Start occupational therapy

## 2014-01-20 NOTE — Progress Notes (Signed)
Patient ID: CEPHUS TUPY, male   DOB: 02-Mar-1946, 68 y.o.   MRN: [id number]  Chief Complaint  Patient presents with  . Follow-up    3 week recheck right humerus with xray DOI 12/21/13    Fracture care 4 week post injury right shoulder proximal humerus fracture fracture looks to be stable  Start additional therapy  Continue hydrocodone Q6  BP 128/75  Ht 5\' 8"  (1.727 m)  Wt 230 lb (104.327 kg)  BMI 34.98 kg/m2 Skin over the right arm still shows some residual ecchymosis Minimal tenderness Elbow wrist hand function normal  Encounter Diagnoses  Name Primary?  . Shoulder fracture, right Yes  . Closed fracture of right proximal humerus

## 2014-01-20 NOTE — Addendum Note (Signed)
Addended by: Moreen Fowler R on: 01/20/2014 11:58 AM   Modules accepted: Orders

## 2014-01-24 ENCOUNTER — Ambulatory Visit (HOSPITAL_COMMUNITY): Payer: Medicare Other | Attending: Orthopedic Surgery | Admitting: Specialist

## 2014-01-28 ENCOUNTER — Ambulatory Visit (HOSPITAL_COMMUNITY)
Admission: RE | Admit: 2014-01-28 | Discharge: 2014-01-28 | Disposition: A | Discharge status: Home / Self Care | Payer: Medicare Other | Source: Ambulatory Visit | Attending: Orthopedic Surgery | Admitting: Orthopedic Surgery

## 2014-01-28 DIAGNOSIS — E119 Type 2 diabetes mellitus without complications: Secondary | ICD-10-CM | POA: Diagnosis not present

## 2014-01-28 DIAGNOSIS — IMO0001 Reserved for inherently not codable concepts without codable children: Secondary | ICD-10-CM | POA: Diagnosis present

## 2014-01-28 DIAGNOSIS — I10 Essential (primary) hypertension: Secondary | ICD-10-CM | POA: Diagnosis not present

## 2014-01-28 DIAGNOSIS — M6281 Muscle weakness (generalized): Secondary | ICD-10-CM | POA: Insufficient documentation

## 2014-01-28 DIAGNOSIS — M25519 Pain in unspecified shoulder: Secondary | ICD-10-CM | POA: Insufficient documentation

## 2014-01-28 DIAGNOSIS — M25529 Pain in unspecified elbow: Secondary | ICD-10-CM | POA: Insufficient documentation

## 2014-01-28 DIAGNOSIS — M25619 Stiffness of unspecified shoulder, not elsewhere classified: Secondary | ICD-10-CM | POA: Insufficient documentation

## 2014-01-28 DIAGNOSIS — M25611 Stiffness of right shoulder, not elsewhere classified: Secondary | ICD-10-CM | POA: Insufficient documentation

## 2014-01-28 NOTE — Evaluation (Signed)
Occupational Therapy Evaluation  Patient Details  Name: Cody Wood MRN: [id number] Date of Birth: 13-Nov-1945  Today's Date: 01/28/2014 Time: 1350-1430 OT Time Calculation (min): 40 min OT eval 1350-1410 19' [place] 2841-3244 13' [place] 0102-7253 8' Visit#: 1 of 24  Re-eval: 02/25/14  Assessment Diagnosis: s/p Right shoulder fx Next MD Visit: 03/17/14 Aline Brochure  Prior Therapy: None  Authorization: Medicare  Authorization Time Period: before 10th visit  Authorization Visit#: 1 of 10   Past Medical History:  Past Medical History  Diagnosis Date  . Diabetes mellitus   . Blockage of coronary artery of heart   . Hypertension   . hodgkins lymphoma dx'd 06/2008   Past Surgical History:  Past Surgical History  Procedure Laterality Date  . Knee surgery    . Mandible surgery    . Neck surgery      Subjective Symptoms/Limitations Symptoms: S: I fall all the time. I just learn to slow down and think about what I'm doing before I move.  Pertinent History: 5 weeks ago, Mr. Daughety tripped over carpet and fell landing on his elbows sustaining a right proximal humerus fracture. Patient was given sling to wear for comfort. No sx was performed. Fracture has healed. Dr. Aline Brochure has referred patient to occupational therapy for evaluation and treatment.  Limitations: Reaching up above shoulder, lifting heavy items, pain, putting shirt on/off, pushing 5 speed manual stick forward when driving.  Patient Stated Goals: "I'd like to get back to where I was before."  Pain Assessment Currently in Pain?: Yes Pain Score: 3  Pain Location: Shoulder Pain Orientation: Right Pain Type: Acute pain  Precautions/Restrictions  Precautions Precautions: None Precaution Comments: PROM for 6 weeks. Starting the week of May 4th, may start AAROM if pain free. progress as tolerated. Required Braces or Orthoses: Sling  Balance Screening Balance Screen Has the patient fallen in the past 6 months:  Yes How many times?: 8 Has the patient had a decrease in activity level because of a fear of falling? : No Is the patient reluctant to leave their home because of a fear of falling? 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 Leisure: Hobbies-yes (Comment) Comments: watching movies  Assessment ADL/Vision/Perception ADL ADL Comments: Donning/doffing shirt, putting socks, reaching into cabintets, reaching forward to switch 5 speed manual stick forward (able to pull back), lifting heavy items Vision - History Baseline Vision: No visual deficits  Cognition/Observation Cognition Overall Cognitive Status: Within Functional Limits for tasks assessed Arousal/Alertness: Awake/alert Orientation Level: Oriented X4  Sensation/Coordination/Edema Edema Edema: Increased edema in right upper arm region starting from shoulder down to elbow.   Additional Assessments RUE PROM (degrees) Right Shoulder Flexion: 80 Degrees Right Shoulder ABduction: 80 Degrees Right Shoulder Internal Rotation: 90 Degrees Right Shoulder External Rotation: 55 Degrees RUE Strength Right Shoulder Flexion: 3-/5 Right Shoulder ABduction: 3-/5 Right Shoulder Internal Rotation: 3/5 Right Shoulder External Rotation: 3-/5 Palpation Palpation: Max fascial restrictions in right upper arm, trapezius, and scapularis region.      Exercise/Treatments Supine Protraction: PROM;5 reps Horizontal ABduction: Limitations Horizontal ABduction Limitations: unable to tolerate this date. External Rotation: PROM;5 reps Internal Rotation: PROM;5 reps Flexion: PROM;5 reps ABduction: PROM;5 reps    Manual Therapy Manual Therapy: Myofascial release Myofascial Release:  MFR and manual stretching to right upper arm,  trapezius, and scapularis region to decrease fascial restrictions and increase joint mobility in a pain free zone.   Occupational Therapy Assessment and Plan OT Assessment and Plan Clinical Impression Statement: A: Patient is a 68 y/o male s/p right humerus fx who is experiecing increased pain, fascial restrictions, edema and  decreased joint mobility which is causing difficulty completing B/IADL tasks.   Pt will benefit from skilled therapeutic intervention in order to improve on the following deficits: Increased fascial restricitons;Pain;Impaired UE functional use;Decreased strength;Increased edema;Decreased range of motion Rehab Potential: Excellent OT Frequency: Min 3X/week OT Duration: 8 weeks OT Treatment/Interventions: Self-care/ADL training;Therapeutic exercise;Modalities;DME and/or AE instruction;Manual therapy;Patient/family education;Therapeutic activities OT Plan: P: Pt will benefit from skilled OT interventions in order to decrease pain, increase ROM, increase strength, and increase overall RUE functional use. Treatment Plan: PROM, AAROM, and AROM, MFR and manual stretching, scapular strengtheing and proximal stabilization, RUE general strengthening   Goals Short Term Goals Time to Complete Short Term Goals: 4 weeks Short Term Goal 1: Patient will be educated on HEP.  Short Term Goal 2: Patient will report a pain level of 7/10 when completing daily tasks.  Short Term Goal 3: Patient will increase PROM to [facility] to increase ability to put shirt on/off with less difficulty. Short Term Goal 4: Patient will increase RUE shoulder strength to 3+/5 to increase ability to lift heavy items. Short Term Goal 5: Patient will decrease fascial restrictions in RUE from max to min-mod. Long Term Goals Time to Complete Long Term Goals: 8 weeks Long Term Goal 1: Patient will return to highest level of independence with all B/IADL and leisure activities.   Long Term Goal 2: Patient will report a pain level of 3/10 or less when driving stick shift in car. Long Term Goal 3: Patient will increase AROM to [facility] to increase ability to reach up into overhead cabinets with less difficulty.  Long Term Goal 4: Patient will increase RUE shoulder strength to 4/5 to increase ability to lift heavy items.  Long Term Goal 5: Patient will decrease fascial restrictions in RUE from min-mod to min.  Problem List Patient Active Problem List   Diagnosis Date Noted  . Muscle weakness (generalized) 01/28/2014  . Pain in joint, upper arm 01/28/2014  . Decreased range of motion of right shoulder 01/28/2014  . Closed fracture of right proximal humerus 12/23/2013  . Personal history of falling, presenting hazards to health 05/05/2012  . Bilateral leg weakness 05/05/2012  . Hodgkin's lymphoma 04/29/2012  . Chest pain on exertion 09/03/2011    End of Session Activity Tolerance: Patient tolerated treatment well General Behavior During Therapy: [facility] for tasks assessed/performed OT Plan of Care OT Home Exercise Plan: towel slides OT Patient Instructions: handout (scanned) Consulted and Agree with Plan of Care: Patient  GO Functional Assessment Tool Used: FOTO score: 30/100 (70% impaired) Functional Limitation: Carrying, moving and handling objects Carrying, Moving and Handling Objects Current Status ([id number]): At least 60 percent but less than 80 percent impaired, limited or restricted Carrying, Moving and Handling Objects Goal Status [phone]): At least 20 percent but less than 40 percent impaired, limited or restricted  Ailene Ravel, OTR/L,CBIS   01/28/2014, 3:02 PM  Physician Documentation Your signature is required to indicate approval of the treatment plan as stated above.  Please sign and either send electronically or make a copy of this report for your files and return this physician signed original.  Please mark one 1.__approve of plan  2. ___approve of  plan with  the following conditions.   ______________________________                                                          _____________________ Physician Signature                                                                                                             Date

## 2014-02-01 ENCOUNTER — Ambulatory Visit (HOSPITAL_COMMUNITY)
Admission: RE | Admit: 2014-02-01 | Discharge: 2014-02-01 | Disposition: A | Discharge status: Home / Self Care | Payer: Medicare Other | Source: Ambulatory Visit | Attending: Orthopedic Surgery | Admitting: Orthopedic Surgery

## 2014-02-01 DIAGNOSIS — IMO0001 Reserved for inherently not codable concepts without codable children: Secondary | ICD-10-CM | POA: Diagnosis not present

## 2014-02-01 NOTE — Progress Notes (Signed)
Occupational Therapy Treatment Patient Details  Name: Cody Wood MRN: [id number] Date of Birth: 1946/02/01  Today's Date: 02/01/2014 Time: 6644-0347 OT Time Calculation (min): 35 min MFR 4259-5638 12' Therex 7564-3329 23'  Visit#: 2 of 24  Re-eval: 02/25/14    Authorization: Medicare  Authorization Time Period: before 10th visit  Authorization Visit#: 2 of 10  Subjective Symptoms/Limitations Symptoms: S: I may have over did the exercises you gave me today. I'm really sore after doing those.  Pain Assessment Currently in Pain?: Yes Pain Score: 4  Pain Location: Shoulder Pain Orientation: Right Pain Type: Acute pain  Precautions/Restrictions  Precautions Precautions: None Precaution Comments: PROM for 6 weeks. Starting the week of May 4th, may start AAROM if pain free. progress as tolerated.  Exercise/Treatments Supine Protraction: PROM;10 reps Horizontal ABduction: PROM;10 reps External Rotation: PROM;10 reps Internal Rotation: PROM;10 reps Flexion: PROM;10 reps ABduction: PROM;10 reps Seated Elevation: AROM;12 reps Extension: AROM;12 reps Row: AROM;12 reps Therapy Ball Flexion: 15 reps ABduction: 15 reps     Manual Therapy Manual Therapy: Myofascial release Myofascial Release: MFR and manual stretching to right upper arm, trapezius, and scapularis region to decrease fascial restrictions and increase joint mobility in a pain free zone.  Occupational Therapy Assessment and Plan OT Assessment and Plan Clinical Impression Statement: A: Patient able to tolerate Passive Manual stretching with pain. Full range achieved for horizontal abduction OT Plan: P: Add isometrics.   Goals Short Term Goals Time to Complete Short Term Goals: 4 weeks Short Term Goal 1: Patient will be educated on HEP.  Short Term Goal 1 Progress: Progressing toward goal Short Term Goal 2: Patient will report a pain level of 7/10 when completing daily tasks.  Short Term Goal 2  Progress: Progressing toward goal Short Term Goal 3: Patient will increase PROM to [facility] to increase ability to put shirt on/off with less difficulty. Short Term Goal 3 Progress: Progressing toward goal Short Term Goal 4: Patient will increase RUE shoulder strength to 3+/5 to increase ability to lift heavy items. Short Term Goal 4 Progress: Progressing toward goal Short Term Goal 5: Patient will decrease fascial restrictions in RUE from max to min-mod. Short Term Goal 5 Progress: Progressing toward goal Long Term Goals Time to Complete Long Term Goals: 8 weeks Long Term Goal 1: Patient will return to highest level of independence with all B/IADL and leisure activities.  Long Term Goal 1 Progress: Progressing toward goal Long Term Goal 2: Patient will report a pain level of 3/10 or less when driving stick shift in car. Long Term Goal 2 Progress: Progressing toward goal Long Term Goal 3: Patient will increase AROM to [facility] to increase ability to reach up into overhead cabinets with less difficulty.  Long Term Goal 3 Progress: Progressing toward goal Long Term Goal 4: Patient will increase RUE shoulder strength to 4/5 to increase ability to lift heavy items.  Long Term Goal 4 Progress: Progressing toward goal Long Term Goal 5: Patient will decrease fascial restrictions in RUE from min-mod to min. Long Term Goal 5 Progress: Progressing toward goal  Problem List Patient Active Problem List   Diagnosis Date Noted  . Muscle weakness (generalized) 01/28/2014  . Pain in joint, upper arm 01/28/2014  . Decreased range of motion of right shoulder 01/28/2014  . Closed fracture of right proximal humerus 12/23/2013  . Personal history of falling, presenting hazards to health 05/05/2012  . Bilateral leg weakness 05/05/2012  . Hodgkin's lymphoma 04/29/2012  . Chest pain on  exertion 09/03/2011    End of Session Activity Tolerance: Patient tolerated treatment well General Behavior During Therapy: [facility]  for tasks assessed/performed   Ailene Ravel, OTR/L,CBIS   02/01/2014, 11:55 AM

## 2014-02-02 ENCOUNTER — Ambulatory Visit (HOSPITAL_COMMUNITY)
Admission: RE | Admit: 2014-02-02 | Discharge: 2014-02-02 | Disposition: A | Discharge status: Home / Self Care | Payer: Medicare Other | Source: Ambulatory Visit | Attending: Family Medicine | Admitting: Family Medicine

## 2014-02-02 DIAGNOSIS — IMO0001 Reserved for inherently not codable concepts without codable children: Secondary | ICD-10-CM | POA: Diagnosis not present

## 2014-02-02 NOTE — Progress Notes (Signed)
Occupational Therapy Treatment Patient Details  Name: Cody Wood MRN: [id number] Date of Birth: [birth year]  Today's Date: 02/02/2014 Time: 1308-6578 OT Time Calculation (min): 44 min Manual 1304-1330 (26') Therapeutic Exercises 4696-2952 (18')  Visit#: 3 of 24  Re-eval: 02/25/14    Authorization: Medicare  Authorization Time Period: before 10th visit  Authorization Visit#: 3 of 10  Subjective Symptoms/Limitations Symptoms: I got an icepack yeterday, and that really helped. its just uncomfortable today. I could feel it easing off with that ice." Pain Assessment Currently in Pain?: Yes Pain Score: 3   Exercise/Treatments Supine Protraction: PROM;10 reps Horizontal ABduction: PROM;10 reps External Rotation: PROM;10 reps Internal Rotation: PROM;10 reps Flexion: PROM;10 reps ABduction: PROM;10 reps Seated Elevation: AROM;12 reps Extension: AROM;12 reps Row: AROM;12 reps Therapy Ball Flexion: 15 reps ABduction: 15 reps ROM / Strengthening / Isometric Strengthening   Flexion: 3X3";Supine Extension: Supine;3X3" External Rotation: Supine;3X3" Internal Rotation: Supine;3X3" ABduction: Supine;3X3" ADduction: Supine;3X3"    Manual Therapy Manual Therapy: Myofascial release Myofascial Release: MFR and manual stretching to right upper arm, trapezius, and scapularis region to decrease fascial restrictions and increase joint mobility in a pain free zone.    Occupational Therapy Assessment and Plan OT Assessment and Plan Clinical Impression Statement: patient had improved range with PROM, but still with pain. Added 3x3" isometrics this session - pt had very good tolerance. OT Plan: Increase isometric hold to 5".  Add pro/ret/elev/dep.   Goals Short Term Goals Short Term Goal 1: Patient will be educated on HEP.  Short Term Goal 1 Progress: Progressing toward goal Short Term Goal 2: Patient will report a pain level of 7/10 when completing daily tasks.  Short Term Goal  2 Progress: Progressing toward goal Short Term Goal 3: Patient will increase PROM to [facility] to increase ability to put shirt on/off with less difficulty. Short Term Goal 3 Progress: Progressing toward goal Short Term Goal 4: Patient will increase RUE shoulder strength to 3+/5 to increase ability to lift heavy items. Short Term Goal 4 Progress: Progressing toward goal Short Term Goal 5: Patient will decrease fascial restrictions in RUE from max to min-mod. Short Term Goal 5 Progress: Progressing toward goal Long Term Goals Long Term Goal 1: Patient will return to highest level of independence with all B/IADL and leisure activities.  Long Term Goal 1 Progress: Progressing toward goal Long Term Goal 2: Patient will report a pain level of 3/10 or less when driving stick shift in car. Long Term Goal 2 Progress: Progressing toward goal Long Term Goal 3: Patient will increase AROM to [facility] to increase ability to reach up into overhead cabinets with less difficulty.  Long Term Goal 3 Progress: Progressing toward goal Long Term Goal 4: Patient will increase RUE shoulder strength to 4/5 to increase ability to lift heavy items.  Long Term Goal 4 Progress: Progressing toward goal Long Term Goal 5: Patient will decrease fascial restrictions in RUE from min-mod to min. Long Term Goal 5 Progress: Progressing toward goal  Problem List Patient Active Problem List   Diagnosis Date Noted  . Muscle weakness (generalized) 01/28/2014  . Pain in joint, upper arm 01/28/2014  . Decreased range of motion of right shoulder 01/28/2014  . Closed fracture of right proximal humerus 12/23/2013  . Personal history of falling, presenting hazards to health 05/05/2012  . Bilateral leg weakness 05/05/2012  . Hodgkin's lymphoma 04/29/2012  . Chest pain on exertion 09/03/2011    End of Session Activity Tolerance: Patient tolerated treatment well General Behavior  During Therapy: [facility] for tasks assessed/performed  GO     Bea Graff, MS, OTR/L [phone]  02/02/2014, 1:53 PM

## 2014-02-04 ENCOUNTER — Ambulatory Visit (HOSPITAL_COMMUNITY)
Admission: RE | Admit: 2014-02-04 | Discharge: 2014-02-04 | Disposition: A | Discharge status: Home / Self Care | Payer: Medicare Other | Source: Ambulatory Visit | Attending: Orthopedic Surgery | Admitting: Orthopedic Surgery

## 2014-02-04 DIAGNOSIS — IMO0001 Reserved for inherently not codable concepts without codable children: Secondary | ICD-10-CM | POA: Diagnosis not present

## 2014-02-04 NOTE — Progress Notes (Signed)
Occupational Therapy Treatment Patient Details  Name: Cody Wood MRN: [id number] Date of Birth: 1946/04/24  Today's Date: 02/04/2014 Time: 4782-9562 OT Time Calculation (min): 40 min Manual 1357-1420 (23') Therapeutic Exercises 1420-1437 (69')  Visit#: 4 of 24  Re-eval: 02/25/14    Authorization: Medicare  Authorization Time Period: before 10th visit  Authorization Visit#: 4 of 10  Subjective Symptoms/Limitations Symptoms: "i fudged a little - I had a pin pill this morning. I don't want to get used to taking those." Pain Assessment Currently in Pain?: Yes Pain Score: 2  (with pain pill) Pain Location: Shoulder Pain Orientation: Right Pain Type: Acute pain  Precautions/Restrictions     Exercise/Treatments Supine Protraction: PROM;10 reps Horizontal ABduction: PROM;10 reps External Rotation: PROM;10 reps Internal Rotation: PROM;10 reps Flexion: PROM;10 reps ABduction: PROM;10 reps Seated Elevation: AROM;15 reps Extension: AROM;15 reps Row: AROM;15 reps Therapy Ball Flexion: 15 reps ABduction: 15 reps ROM / Strengthening / Isometric Strengthening Prot/Ret//Elev/Dep: 5 reps Flexion: 3X5";Supine Extension: 3X5";Supine External Rotation: 3X5";Supine Internal Rotation: 3X5";Supine ABduction: 3X5";Supine ADduction: 3X5";Supine Manual Therapy Manual Therapy: Myofascial release Myofascial Release: MFR and manual stretching to right upper arm, trapezius, and scapularis region to decrease fascial restrictions and increase joint mobility in a pain free zone.     Occupational Therapy Assessment and Plan OT Assessment and Plan Clinical Impression Statement: Slight improvements with PROM over course of 10 reps, with pain. Increased isometric holds to 5" each - pt had good tolerance.  Added pro/ret/elev/dep with good tolerance. OT Plan: Add thumbtacks. Continue isometrics and PROM.   Goals Short Term Goals Short Term Goal 1: Patient will be educated on HEP.  Short  Term Goal 1 Progress: Progressing toward goal Short Term Goal 2: Patient will report a pain level of 7/10 when completing daily tasks.  Short Term Goal 2 Progress: Progressing toward goal Short Term Goal 3: Patient will increase PROM to [facility] to increase ability to put shirt on/off with less difficulty. Short Term Goal 3 Progress: Progressing toward goal Short Term Goal 4: Patient will increase RUE shoulder strength to 3+/5 to increase ability to lift heavy items. Short Term Goal 4 Progress: Progressing toward goal Short Term Goal 5: Patient will decrease fascial restrictions in RUE from max to min-mod. Short Term Goal 5 Progress: Progressing toward goal Long Term Goals Long Term Goal 1: Patient will return to highest level of independence with all B/IADL and leisure activities.  Long Term Goal 1 Progress: Progressing toward goal Long Term Goal 2: Patient will report a pain level of 3/10 or less when driving stick shift in car. Long Term Goal 2 Progress: Progressing toward goal Long Term Goal 3: Patient will increase AROM to [facility] to increase ability to reach up into overhead cabinets with less difficulty.  Long Term Goal 3 Progress: Progressing toward goal Long Term Goal 4: Patient will increase RUE shoulder strength to 4/5 to increase ability to lift heavy items.  Long Term Goal 4 Progress: Progressing toward goal Long Term Goal 5: Patient will decrease fascial restrictions in RUE from min-mod to min. Long Term Goal 5 Progress: Progressing toward goal  Problem List Patient Active Problem List   Diagnosis Date Noted  . Muscle weakness (generalized) 01/28/2014  . Pain in joint, upper arm 01/28/2014  . Decreased range of motion of right shoulder 01/28/2014  . Closed fracture of right proximal humerus 12/23/2013  . Personal history of falling, presenting hazards to health 05/05/2012  . Bilateral leg weakness 05/05/2012  . Hodgkin's lymphoma 04/29/2012  .  Chest pain on exertion 09/03/2011     End of Session Activity Tolerance: Patient tolerated treatment well General Behavior During Therapy: [facility] for tasks assessed/performed  GO    Bea Graff, [place], OTR/L [phone]  02/04/2014, 2:41 PM

## 2014-02-07 ENCOUNTER — Ambulatory Visit (HOSPITAL_COMMUNITY)
Admission: RE | Admit: 2014-02-07 | Discharge: 2014-02-07 | Disposition: A | Discharge status: Home / Self Care | Payer: Medicare Other | Source: Ambulatory Visit | Attending: Family Medicine | Admitting: Family Medicine

## 2014-02-07 DIAGNOSIS — IMO0001 Reserved for inherently not codable concepts without codable children: Secondary | ICD-10-CM | POA: Diagnosis not present

## 2014-02-07 NOTE — Progress Notes (Signed)
Occupational Therapy Treatment Patient Details  Name: Cody Wood MRN: [id number] Date of Birth: [birth year]  Today's Date: 02/07/2014 Time: 5456-2563 OT Time Calculation (min): 40 min MFR 8937-3428 10' Therex 7681-1572 30'  Visit#: 5 of 24  Re-eval: 02/25/14    Authorization:    Authorization Time Period: before 10th visit  Authorization Visit#: 5 of 10  Subjective Symptoms/Limitations Symptoms: S: My shoulder was hurting a lot earlier. It's not so bad now.  Pain Assessment Currently in Pain?: Yes Pain Score: 2  Pain Location: Shoulder Pain Orientation: Right Pain Type: Acute pain  Precautions/Restrictions  Precautions Precautions: None Precaution Comments: PROM for 6 weeks. Starting the week of May 4th, may start AAROM if pain free. progress as tolerated.  Exercise/Treatments Supine Protraction: PROM;10 reps Horizontal ABduction: PROM;10 reps External Rotation: PROM;10 reps Internal Rotation: PROM;10 reps Flexion: PROM;10 reps ABduction: PROM;10 reps Seated Elevation: AROM;15 reps Extension: AROM;15 reps Row: AROM;15 reps Therapy Ball Flexion: 15 reps ABduction: 15 reps ROM / Strengthening / Isometric Strengthening Thumb Tacks: 1' Prot/Ret//Elev/Dep: 5 reps Flexion: 3X5";Supine Extension: 3X5";Supine External Rotation: 3X5";Supine Internal Rotation: 3X5";Supine ABduction: 3X5";Supine ADduction: 3X5";Supine  Hand Exercises Theraputty - Roll: red Theraputty - Grip: red Theraputty - Pinch: red     Manual Therapy Manual Therapy: Myofascial release Myofascial Release: MFR and manual stretching to right upper arm, trapezius, and scapularis region to decrease fascial restrictions and increase joint mobility in a pain free zone.   Occupational Therapy Assessment and Plan OT Assessment and Plan Clinical Impression Statement: A: Patient able to tolerate PROM with less pain this date. Added red theraputty exercises to increase grip strength as patient  states that he has difficulty holding onto a coffee cup.  OT Plan: P: Cont to increase tolerance with PROM exercises. Increase PROM to 75% flexion and abduction.    Goals Short Term Goals Time to Complete Short Term Goals: 4 weeks Short Term Goal 1: Patient will be educated on HEP.  Short Term Goal 1 Progress: Progressing toward goal Short Term Goal 2: Patient will report a pain level of 7/10 when completing daily tasks.  Short Term Goal 2 Progress: Progressing toward goal Short Term Goal 3: Patient will increase PROM to [facility] to increase ability to put shirt on/off with less difficulty. Short Term Goal 3 Progress: Progressing toward goal Short Term Goal 4: Patient will increase RUE shoulder strength to 3+/5 to increase ability to lift heavy items. Short Term Goal 4 Progress: Progressing toward goal Short Term Goal 5: Patient will decrease fascial restrictions in RUE from max to min-mod. Short Term Goal 5 Progress: Progressing toward goal Long Term Goals Time to Complete Long Term Goals: 8 weeks Long Term Goal 1: Patient will return to highest level of independence with all B/IADL and leisure activities.  Long Term Goal 1 Progress: Progressing toward goal Long Term Goal 2: Patient will report a pain level of 3/10 or less when driving stick shift in car. Long Term Goal 2 Progress: Progressing toward goal Long Term Goal 3: Patient will increase AROM to [facility] to increase ability to reach up into overhead cabinets with less difficulty.  Long Term Goal 3 Progress: Progressing toward goal Long Term Goal 4: Patient will increase RUE shoulder strength to 4/5 to increase ability to lift heavy items.  Long Term Goal 4 Progress: Progressing toward goal Long Term Goal 5: Patient will decrease fascial restrictions in RUE from min-mod to min. Long Term Goal 5 Progress: Progressing toward goal  Problem List Patient Active  Problem List   Diagnosis Date Noted  . Muscle weakness (generalized) 01/28/2014   . Pain in joint, upper arm 01/28/2014  . Decreased range of motion of right shoulder 01/28/2014  . Closed fracture of right proximal humerus 12/23/2013  . Personal history of falling, presenting hazards to health 05/05/2012  . Bilateral leg weakness 05/05/2012  . Hodgkin's lymphoma 04/29/2012  . Chest pain on exertion 09/03/2011    End of Session Activity Tolerance: Patient tolerated treatment well General Behavior During Therapy: [facility] for tasks assessed/performed OT Plan of Care OT Home Exercise Plan: red theraputty  OT Patient Instructions: verbal Consulted and Agree with Plan of Care: Patient   Ailene Ravel, OTR/L,CBIS   02/07/2014, 1:48 PM

## 2014-02-09 ENCOUNTER — Ambulatory Visit (HOSPITAL_COMMUNITY)
Admission: RE | Admit: 2014-02-09 | Discharge: 2014-02-09 | Disposition: A | Discharge status: Home / Self Care | Payer: Medicare Other | Source: Ambulatory Visit | Attending: Orthopedic Surgery | Admitting: Orthopedic Surgery

## 2014-02-09 DIAGNOSIS — IMO0001 Reserved for inherently not codable concepts without codable children: Secondary | ICD-10-CM | POA: Diagnosis not present

## 2014-02-09 NOTE — Progress Notes (Signed)
Occupational Therapy Treatment Patient Details  Name: Cody Wood MRN: [id number] Date of Birth: 1946/01/31  Today's Date: 02/09/2014 Time: 1779-3903 OT Time Calculation (min): 37 min MFR 0092-3300 12' Therex 7622-6333  25'  Visit#: 6 of 24  Re-eval: 02/25/14    Authorization:    Authorization Time Period: before 10th visit  Authorization Visit#: 6 of 10  Subjective Symptoms/Limitations Symptoms: S: I swatted a mosquito with my right hand the other night without thinking and I sure did feel that.  Pain Assessment Currently in Pain?: No/denies  Precautions/Restrictions  Precautions Precautions: None Precaution Comments: PROM for 6 weeks. Starting the week of May 4th, may start AAROM if pain free. progress as tolerated.  Exercise/Treatments Supine Protraction: PROM;10 reps Horizontal ABduction: PROM;10 reps External Rotation: PROM;10 reps Internal Rotation: PROM;10 reps Flexion: PROM;10 reps ABduction: PROM;10 reps Seated Elevation: AROM;15 reps Extension: AROM;15 reps Row: AROM;15 reps Therapy Ball Flexion: 15 reps ABduction: 15 reps ROM / Strengthening / Isometric Strengthening Thumb Tacks: 1' Prot/Ret//Elev/Dep: 1' Flexion: Supine;5X5" Extension: Supine;5X5" External Rotation: Supine;5X5" Internal Rotation: Supine;5X5" ABduction: Supine;5X5" ADduction: Supine;5X5"     Manual Therapy Manual Therapy: Myofascial release Myofascial Release: MFR and manual stretching to right upper arm, trapezius, and scapularis region to decrease fascial restrictions and increase joint mobility in a pain free zone.   Occupational Therapy Assessment and Plan OT Assessment and Plan Clinical Impression Statement: A: Pt continues to have pain with PROM flexion and abduction. Patient also has pain during horizontal adduction. He is progressively making progress with passive stretching.  OT Plan: P: attempt AAROM supine if able to tolerate.    Goals Short Term Goals Time  to Complete Short Term Goals: 4 weeks Short Term Goal 1: Patient will be educated on HEP.  Short Term Goal 1 Progress: Progressing toward goal Short Term Goal 2: Patient will report a pain level of 7/10 when completing daily tasks.  Short Term Goal 2 Progress: Progressing toward goal Short Term Goal 3: Patient will increase PROM to [facility] to increase ability to put shirt on/off with less difficulty. Short Term Goal 3 Progress: Progressing toward goal Short Term Goal 4: Patient will increase RUE shoulder strength to 3+/5 to increase ability to lift heavy items. Short Term Goal 4 Progress: Progressing toward goal Short Term Goal 5: Patient will decrease fascial restrictions in RUE from max to min-mod. Short Term Goal 5 Progress: Progressing toward goal Long Term Goals Time to Complete Long Term Goals: 8 weeks Long Term Goal 1: Patient will return to highest level of independence with all B/IADL and leisure activities.  Long Term Goal 1 Progress: Progressing toward goal Long Term Goal 2: Patient will report a pain level of 3/10 or less when driving stick shift in car. Long Term Goal 2 Progress: Progressing toward goal Long Term Goal 3: Patient will increase AROM to [facility] to increase ability to reach up into overhead cabinets with less difficulty.  Long Term Goal 3 Progress: Progressing toward goal Long Term Goal 4: Patient will increase RUE shoulder strength to 4/5 to increase ability to lift heavy items.  Long Term Goal 4 Progress: Progressing toward goal Long Term Goal 5: Patient will decrease fascial restrictions in RUE from min-mod to min. Long Term Goal 5 Progress: Progressing toward goal  Problem List Patient Active Problem List   Diagnosis Date Noted  . Muscle weakness (generalized) 01/28/2014  . Pain in joint, upper arm 01/28/2014  . Decreased range of motion of right shoulder 01/28/2014  . Closed  fracture of right proximal humerus 12/23/2013  . Personal history of falling, presenting  hazards to health 05/05/2012  . Bilateral leg weakness 05/05/2012  . Hodgkin's lymphoma 04/29/2012  . Chest pain on exertion 09/03/2011    End of Session Activity Tolerance: Patient tolerated treatment well General Behavior During Therapy: [facility] for tasks assessed/performed   Ailene Ravel, OTR/L,CBIS   02/09/2014, 11:52 AM

## 2014-02-11 ENCOUNTER — Ambulatory Visit (HOSPITAL_COMMUNITY)
Admission: RE | Admit: 2014-02-11 | Discharge: 2014-02-11 | Disposition: A | Discharge status: Home / Self Care | Payer: Medicare Other | Source: Ambulatory Visit | Attending: Family Medicine | Admitting: Family Medicine

## 2014-02-11 DIAGNOSIS — IMO0001 Reserved for inherently not codable concepts without codable children: Secondary | ICD-10-CM | POA: Diagnosis not present

## 2014-02-11 NOTE — Progress Notes (Signed)
Occupational Therapy Treatment Patient Details  Name: Cody Wood MRN: [id number] Date of Birth: 03-12-1946  Today's Date: 02/11/2014 Time: 7989-2119 OT Time Calculation (min): 45 min Manual therapy 4174-0814 18' Therapeutic exercises 4818-5631 27' Visit#: 7 of 24  Re-eval: 02/25/14    Authorization:   Medicare Authorization Time Period: before 10th visit  Authorization Visit#: 7 of 10  Subjective  S:  The hardest thing for me is lifting anything heavy, like putting my trailer on the hitch, or reaching overhead. Pain Assessment Currently in Pain?: Yes Pain Score: 2  Pain Location: Shoulder Pain Orientation: Right Pain Type: Acute pain  Precautions/Restrictions     Exercise/Treatments Supine Protraction: PROM;AAROM;10 reps Horizontal ABduction: PROM;AAROM;10 reps External Rotation: PROM;AAROM;10 reps Internal Rotation: PROM;AAROM;10 reps Flexion: PROM;AAROM;10 reps ABduction: PROM;AAROM;10 reps Other Supine Exercises: bridges 10X Seated Elevation: AROM;15 reps Extension: AROM;15 reps Row: AROM;15 reps Therapy Ball Flexion: 20 reps ABduction: 20 reps ROM / Strengthening / Isometric Strengthening Thumb Tacks: 1' Prot/Ret//Elev/Dep: 1'       Manual Therapy Manual Therapy: Myofascial release Myofascial Release: Myofascial Release (MFR) and manual stretching to right upper arm, trapezius, and scapularis region to decrease fascial restrictions and increase joint mobility in a pain free zone.   Occupational Therapy Assessment and Plan OT Assessment and Plan Clinical Impression Statement: A:  Added dowel rod AAROM this date, however limited to 50% horizontal abd, flexion, abduction. OT Plan: P:  Increase PROM and AAROM by 25% for increased independence with functional activities at home.    Goals Short Term Goals Time to Complete Short Term Goals: 4 weeks Short Term Goal 1: Patient will be educated on HEP.  Short Term Goal 2: Patient will report a pain level  of 7/10 when completing daily tasks.  Short Term Goal 3: Patient will increase PROM to [facility] to increase ability to put shirt on/off with less difficulty. Short Term Goal 4: Patient will increase RUE shoulder strength to 3+/5 to increase ability to lift heavy items. Short Term Goal 5: Patient will decrease fascial restrictions in RUE from max to min-mod. Long Term Goals Time to Complete Long Term Goals: 8 weeks Long Term Goal 1: Patient will return to highest level of independence with all B/IADL and leisure activities.  Long Term Goal 2: Patient will report a pain level of 3/10 or less when driving stick shift in car. Long Term Goal 3: Patient will increase AROM to [facility] to increase ability to reach up into overhead cabinets with less difficulty.  Long Term Goal 4: Patient will increase RUE shoulder strength to 4/5 to increase ability to lift heavy items.  Long Term Goal 5: Patient will decrease fascial restrictions in RUE from min-mod to min.  Problem List Patient Active Problem List   Diagnosis Date Noted  . Muscle weakness (generalized) 01/28/2014  . Pain in joint, upper arm 01/28/2014  . Decreased range of motion of right shoulder 01/28/2014  . Closed fracture of right proximal humerus 12/23/2013  . Personal history of falling, presenting hazards to health 05/05/2012  . Bilateral leg weakness 05/05/2012  . Hodgkin's lymphoma 04/29/2012  . Chest pain on exertion 09/03/2011    End of Session Activity Tolerance: Patient tolerated treatment well General Behavior During Therapy: [facility] for tasks assessed/performed  [place], OTR/L [phone]  02/11/2014, 2:36 PM

## 2014-02-14 ENCOUNTER — Ambulatory Visit (HOSPITAL_COMMUNITY)
Admission: RE | Admit: 2014-02-14 | Discharge: 2014-02-14 | Disposition: A | Discharge status: Home / Self Care | Payer: Medicare Other | Source: Ambulatory Visit | Attending: Family Medicine | Admitting: Family Medicine

## 2014-02-14 DIAGNOSIS — IMO0001 Reserved for inherently not codable concepts without codable children: Secondary | ICD-10-CM | POA: Diagnosis not present

## 2014-02-14 NOTE — Progress Notes (Signed)
Occupational Therapy Treatment Patient Details  Name: Cody Wood MRN: [id number] Date of Birth: December 09, 1945  Today's Date: 02/14/2014 Time: 0175-1025 OT Time Calculation (min): 44 min Manual 1302-1322 (20') Therapeutic Exercises 1322-1346 (55')  Visit#: 8 of 24  Re-eval:      Authorization: Medicare  Authorization Time Period: before 10th visit  Authorization Visit#: 8 of 10  Subjective Symptoms/Limitations Symptoms: "I've been working it today!" Pain Assessment Currently in Pain?: Yes Pain Score: 2  (with movement) Pain Location: Shoulder  Precautions/Restrictions     Exercise/Treatments Supine Protraction: PROM;AAROM;10 reps Horizontal ABduction: PROM;AAROM;10 reps External Rotation: PROM;AAROM;10 reps Internal Rotation: PROM;AAROM;10 reps Flexion: PROM;AAROM;10 reps ABduction: PROM;AAROM;10 reps Other Supine Exercises: bridges 12 reps Seated Elevation: AROM;15 reps Extension: AROM;15 reps Row: AROM;15 reps Therapy Ball Flexion: 20 reps ABduction: 20 reps    Manual Therapy Manual Therapy: Myofascial release Myofascial Release: Myofascial Release (MFR) and manual stretching to right upper arm, trapezius, and scapularis region to decrease fascial restrictions and increase joint mobility in a pain free zone.    Occupational Therapy Assessment and Plan OT Assessment and Plan Clinical Impression Statement: continued dowel AAROm this session. Pt has improved horizontal abduction, and has slighlty improved to just past 90 degrees flexion.  Remains 50% with abduction.  Tolerates well ball stretches. OT Plan: P:  Increase PROM and AAROM by 25% for increased independence with functional activities at home. Attempt pulleys.   Goals Short Term Goals Short Term Goal 1: Patient will be educated on HEP.  Short Term Goal 1 Progress: Progressing toward goal Short Term Goal 2: Patient will report a pain level of 7/10 when completing daily tasks.  Short Term Goal 2  Progress: Progressing toward goal Short Term Goal 4: Patient will increase RUE shoulder strength to 3+/5 to increase ability to lift heavy items. Short Term Goal 4 Progress: Progressing toward goal Short Term Goal 5: Patient will decrease fascial restrictions in RUE from max to min-mod. Short Term Goal 5 Progress: Progressing toward goal Long Term Goals Long Term Goal 1: Patient will return to highest level of independence with all B/IADL and leisure activities.  Long Term Goal 1 Progress: Progressing toward goal Long Term Goal 2: Patient will report a pain level of 3/10 or less when driving stick shift in car. Long Term Goal 2 Progress: Progressing toward goal Long Term Goal 3: Patient will increase AROM to [facility] to increase ability to reach up into overhead cabinets with less difficulty.  Long Term Goal 3 Progress: Progressing toward goal Long Term Goal 4: Patient will increase RUE shoulder strength to 4/5 to increase ability to lift heavy items.  Long Term Goal 4 Progress: Progressing toward goal Long Term Goal 5: Patient will decrease fascial restrictions in RUE from min-mod to min. Long Term Goal 5 Progress: Progressing toward goal  Problem List Patient Active Problem List   Diagnosis Date Noted  . Muscle weakness (generalized) 01/28/2014  . Pain in joint, upper arm 01/28/2014  . Decreased range of motion of right shoulder 01/28/2014  . Closed fracture of right proximal humerus 12/23/2013  . Personal history of falling, presenting hazards to health 05/05/2012  . Bilateral leg weakness 05/05/2012  . Hodgkin's lymphoma 04/29/2012  . Chest pain on exertion 09/03/2011    End of Session Activity Tolerance: Patient tolerated treatment well General Behavior During Therapy: [facility] for tasks assessed/performed  GO   Bea Graff, MS, OTR/L [phone]  02/14/2014, 2:40 PM

## 2014-02-16 ENCOUNTER — Ambulatory Visit (HOSPITAL_COMMUNITY)
Admission: RE | Admit: 2014-02-16 | Discharge: 2014-02-16 | Disposition: A | Discharge status: Home / Self Care | Payer: Medicare Other | Source: Ambulatory Visit | Attending: Orthopedic Surgery | Admitting: Orthopedic Surgery

## 2014-02-16 DIAGNOSIS — IMO0001 Reserved for inherently not codable concepts without codable children: Secondary | ICD-10-CM | POA: Diagnosis not present

## 2014-02-16 NOTE — Progress Notes (Signed)
Occupational Therapy Treatment Patient Details  Name: Cody Wood MRN: [id number] Date of Birth: [birth year]  Today's Date: 02/16/2014 Time: 5974-1638 OT Time Calculation (min): 42 min MFR 4536-4680 12' Therex 3212-2482 30'  Visit#: 9 of 24  Re-eval:      Authorization: Medicare  Authorization Time Period: before 10th visit  Authorization Visit#: 9 of 10  Subjective Symptoms/Limitations Symptoms: S: I have a shower hose in my shower that I'm able to reach now with my shoulder. Pain Assessment Currently in Pain?: No/denies  Precautions/Restrictions  Precautions Precautions: None Precaution Comments: PROM for 6 weeks. Starting the week of May 4th, may start AAROM if pain free. progress as tolerated.  Exercise/Treatments Supine Protraction: PROM;10 reps;AAROM;12 reps Horizontal ABduction: PROM;10 reps;AAROM;12 reps External Rotation: PROM;10 reps;AAROM;12 reps Internal Rotation: PROM;10 reps;AAROM;12 reps Flexion: PROM;10 reps;AAROM;12 reps ABduction: PROM;10 reps;AAROM;12 reps Seated Elevation: AROM;15 reps Extension: AROM;15 reps Row: AROM;15 reps Therapy Ball Flexion: 20 reps ABduction: 20 reps ROM / Strengthening / Isometric Strengthening Thumb Tacks: 1' Proximal Shoulder Strengthening, Supine: 10X Prot/Ret//Elev/Dep: 1' Flexion:  (d/c) Extension:  (d/c) External Rotation:  (d/c) Internal Rotation:  (d/c) ABduction:  (d/c) ADduction:  (d/c)  Manual Therapy Manual Therapy: Myofascial release Myofascial Release: Myofascial Release (MFR) and manual stretching to right upper arm, trapezius, and scapularis region to decrease fascial restrictions and increase joint mobility in a pain free zone  Occupational Therapy Assessment and Plan OT Assessment and Plan Clinical Impression Statement: A: Patient completed all exercises with good form. No complaints of pain at beginning of tx session. Some pain with AAROM exercises. patient tolerated well overall. Did not  complete pulleys due to time constraint.  OT Plan: P: Added AAROM standing. Pulleys   Goals Short Term Goals Short Term Goal 1: Patient will be educated on HEP.  Short Term Goal 2: Patient will report a pain level of 7/10 when completing daily tasks.  Short Term Goal 4: Patient will increase RUE shoulder strength to 3+/5 to increase ability to lift heavy items. Short Term Goal 5: Patient will decrease fascial restrictions in RUE from max to min-mod. Long Term Goals Long Term Goal 1: Patient will return to highest level of independence with all B/IADL and leisure activities.  Long Term Goal 2: Patient will report a pain level of 3/10 or less when driving stick shift in car. Long Term Goal 3: Patient will increase AROM to [facility] to increase ability to reach up into overhead cabinets with less difficulty.  Long Term Goal 4: Patient will increase RUE shoulder strength to 4/5 to increase ability to lift heavy items.  Long Term Goal 5: Patient will decrease fascial restrictions in RUE from min-mod to min.  Problem List Patient Active Problem List   Diagnosis Date Noted  . Muscle weakness (generalized) 01/28/2014  . Pain in joint, upper arm 01/28/2014  . Decreased range of motion of right shoulder 01/28/2014  . Closed fracture of right proximal humerus 12/23/2013  . Personal history of falling, presenting hazards to health 05/05/2012  . Bilateral leg weakness 05/05/2012  . Hodgkin's lymphoma 04/29/2012  . Chest pain on exertion 09/03/2011    End of Session Activity Tolerance: Patient tolerated treatment well General Behavior During Therapy: [facility] for tasks assessed/performed   Ailene Ravel, OTR/L,CBIS   02/16/2014, 2:56 PM

## 2014-02-18 ENCOUNTER — Ambulatory Visit (HOSPITAL_COMMUNITY)
Admission: RE | Admit: 2014-02-18 | Discharge: 2014-02-18 | Disposition: A | Discharge status: Home / Self Care | Payer: Medicare Other | Source: Ambulatory Visit | Attending: Orthopedic Surgery | Admitting: Orthopedic Surgery

## 2014-02-18 DIAGNOSIS — IMO0001 Reserved for inherently not codable concepts without codable children: Secondary | ICD-10-CM | POA: Diagnosis not present

## 2014-02-18 NOTE — Progress Notes (Signed)
Occupational Therapy Treatment Patient Details  Name: CORGAN MORMILE MRN: [id number] Date of Birth: [birth year]  Today's Date: 02/18/2014 Time: 3875-6433 OT Time Calculation (min): 44 min Manual therapy 2951-8841 10' Therapeutic exercises 1316-1350 34' Visit#: 10 of 24  Re-eval: 02/25/14    Authorization: Medicare  Authorization Time Period: before 20th visit  Authorization Visit#: 10 of 20  Subjective Symptoms/Limitations Limitations: begin AAROm week of 5/4 Special Tests: FOTO 61.32/100 Pain Assessment Currently in Pain?: Yes Pain Score: 2  Pain Location: Shoulder Pain Orientation: Right Pain Type: Acute pain  Precautions/Restrictions  Precautions Precaution Comments: PROM for 6 weeks. Starting the week of May 4th, may start AAROM if pain free. progress as tolerated.  Exercise/Treatments Supine Protraction: PROM;10 reps;AAROM;15 reps Horizontal ABduction: PROM;10 reps;AAROM;15 reps External Rotation: PROM;10 reps;AAROM;15 reps Internal Rotation: PROM;10 reps;AAROM;15 reps Flexion: PROM;10 reps;AAROM;15 reps ABduction: PROM;10 reps;AAROM;15 reps Seated Elevation: AROM;15 reps Extension: AROM;15 reps Row: AROM;15 reps Protraction: AAROM;10 reps Horizontal ABduction: AAROM;10 reps External Rotation: AAROM;10 reps Internal Rotation: AAROM;10 reps Flexion: AAROM;10 reps Abduction: AAROM;10 reps Therapy Ball Flexion: 25 reps ABduction: 25 reps ROM / Strengthening / Isometric Strengthening Wall Wash: 1' Thumb Tacks: 1' Prot/Ret//Elev/Dep: 1'       Manual Therapy Manual Therapy: Myofascial release Myofascial Release: MFR and manual stretching to left upper arm, trapezius, and scapularis region to decrease fascial restrictions and increase joint mobility in a pain free zone.   Occupational Therapy Assessment and Plan OT Assessment and Plan Clinical Impression Statement: A:  Added AAROM in seated and added to HEP. OT Plan: P: Add pulleys, increase wall wash  time.   Goals Short Term Goals Short Term Goal 1: Patient will be educated on HEP.  Short Term Goal 2: Patient will report a pain level of 7/10 when completing daily tasks.  Short Term Goal 4: Patient will increase RUE shoulder strength to 3+/5 to increase ability to lift heavy items. Short Term Goal 5: Patient will decrease fascial restrictions in RUE from max to min-mod. Long Term Goals Long Term Goal 1: Patient will return to highest level of independence with all B/IADL and leisure activities.  Long Term Goal 2: Patient will report a pain level of 3/10 or less when driving stick shift in car. Long Term Goal 3: Patient will increase AROM to [facility] to increase ability to reach up into overhead cabinets with less difficulty.  Long Term Goal 4: Patient will increase RUE shoulder strength to 4/5 to increase ability to lift heavy items.  Long Term Goal 5: Patient will decrease fascial restrictions in RUE from min-mod to min.  Problem List Patient Active Problem List   Diagnosis Date Noted  . Muscle weakness (generalized) 01/28/2014  . Pain in joint, upper arm 01/28/2014  . Decreased range of motion of right shoulder 01/28/2014  . Closed fracture of right proximal humerus 12/23/2013  . Personal history of falling, presenting hazards to health 05/05/2012  . Bilateral leg weakness 05/05/2012  . Hodgkin's lymphoma 04/29/2012  . Chest pain on exertion 09/03/2011    End of Session Activity Tolerance: Patient tolerated treatment well General Behavior During Therapy: [facility] for tasks assessed/performed OT Plan of Care OT Home Exercise Plan: dowel rod exercises in supine OT Patient Instructions: scanned Consulted and Agree with Plan of Care: Patient  GO Functional Assessment Tool Used: FOTO 61% independent Functional Limitation: Carrying, moving and handling objects Carrying, Moving and Handling Objects Current Status (Y6063): At least 20 percent but less than 40 percent impaired, limited or  restricted  Carrying, Moving and Handling Objects Goal Status [phone]): At least 1 percent but less than 20 percent impaired, limited or restricted  Vangie Bicker, OTR/L [phone]  02/18/2014, 3:19 PM

## 2014-02-23 ENCOUNTER — Ambulatory Visit (HOSPITAL_COMMUNITY)
Admission: RE | Admit: 2014-02-23 | Discharge: 2014-02-23 | Disposition: A | Discharge status: Home / Self Care | Payer: Medicare Other | Source: Ambulatory Visit | Attending: Family Medicine | Admitting: Family Medicine

## 2014-02-23 DIAGNOSIS — IMO0001 Reserved for inherently not codable concepts without codable children: Secondary | ICD-10-CM | POA: Diagnosis not present

## 2014-02-23 NOTE — Progress Notes (Signed)
Occupational Therapy Treatment Patient Details  Name: Cody Wood MRN: [id number] Date of Birth: [birth year]  Today's Date: 02/23/2014 Time: 4098-1191 OT Time Calculation (min): 41 min MFR 4782-9562 11' Therex 1308-6578 30'  Visit#: 11 of 24  Re-eval: 02/25/14    Authorization: Medicare  Authorization Time Period: before 20th visit  Authorization Visit#: 11 of 20  Subjective Symptoms/Limitations Symptoms: S: I still can't raise my arm up. I need to help it with my other arm.  Pain Assessment Currently in Pain?: No/denies  Precautions/Restrictions  Precautions Precautions: None Precaution Comments: PROM for 6 weeks. Starting the week of May 4th, may start AAROM if pain free. progress as tolerated.  Exercise/Treatments Supine Protraction: PROM;10 reps;AAROM;15 reps Horizontal ABduction: PROM;10 reps;AAROM;15 reps External Rotation: PROM;10 reps;AAROM;15 reps Internal Rotation: PROM;10 reps;AAROM;15 reps Flexion: PROM;10 reps;AAROM;15 reps ABduction: PROM;10 reps;AAROM;15 reps Seated Elevation: AROM;15 reps Extension: AROM;15 reps Row: AROM;15 reps Protraction: AAROM;10 reps Horizontal ABduction: AAROM;10 reps External Rotation: AAROM;10 reps Internal Rotation: AAROM;10 reps Flexion: AAROM;10 reps Abduction: AAROM;10 reps Pulleys Flexion: 1 minute ABduction: 1 minute ROM / Strengthening / Isometric Strengthening Wall Wash: 2'       Manual Therapy Manual Therapy: Myofascial release Myofascial Release: MFR and manual stretching to left upper arm, trapezius, and scapularis region to decrease fascial restrictions and increase joint mobility in a pain free zone.  Occupational Therapy Assessment and Plan OT Assessment and Plan Clinical Impression Statement: A: Added pulleys and increased time with wall wash. Patient tolerated well.  OT Plan: P: Increase reps with AAROM seated.    Goals Short Term Goals Time to Complete Short Term Goals: 4 weeks Short Term  Goal 1: Patient will be educated on HEP.  Short Term Goal 1 Progress: Progressing toward goal Short Term Goal 2: Patient will report a pain level of 7/10 when completing daily tasks.  Short Term Goal 2 Progress: Progressing toward goal Short Term Goal 3 Progress: Progressing toward goal Short Term Goal 4: Patient will increase RUE shoulder strength to 3+/5 to increase ability to lift heavy items. Short Term Goal 4 Progress: Progressing toward goal Short Term Goal 5: Patient will decrease fascial restrictions in RUE from max to min-mod. Short Term Goal 5 Progress: Progressing toward goal Long Term Goals Time to Complete Long Term Goals: 8 weeks Long Term Goal 1: Patient will return to highest level of independence with all B/IADL and leisure activities.  Long Term Goal 1 Progress: Progressing toward goal Long Term Goal 2: Patient will report a pain level of 3/10 or less when driving stick shift in car. Long Term Goal 2 Progress: Progressing toward goal Long Term Goal 3: Patient will increase AROM to [facility] to increase ability to reach up into overhead cabinets with less difficulty.  Long Term Goal 3 Progress: Progressing toward goal Long Term Goal 4: Patient will increase RUE shoulder strength to 4/5 to increase ability to lift heavy items.  Long Term Goal 4 Progress: Progressing toward goal Long Term Goal 5: Patient will decrease fascial restrictions in RUE from min-mod to min. Long Term Goal 5 Progress: Progressing toward goal  Problem List Patient Active Problem List   Diagnosis Date Noted  . Muscle weakness (generalized) 01/28/2014  . Pain in joint, upper arm 01/28/2014  . Decreased range of motion of right shoulder 01/28/2014  . Closed fracture of right proximal humerus 12/23/2013  . Personal history of falling, presenting hazards to health 05/05/2012  . Bilateral leg weakness 05/05/2012  . Hodgkin's lymphoma 04/29/2012  .  Chest pain on exertion 09/03/2011    End of  Session Activity Tolerance: Patient tolerated treatment well General Behavior During Therapy: [facility] for tasks assessed/performed   Ailene Ravel, OTR/L,CBIS   02/23/2014, 1:47 PM

## 2014-02-25 ENCOUNTER — Ambulatory Visit (HOSPITAL_COMMUNITY)
Admission: RE | Admit: 2014-02-25 | Discharge: 2014-02-25 | Disposition: A | Discharge status: Home / Self Care | Payer: Medicare Other | Source: Ambulatory Visit | Attending: Family Medicine | Admitting: Family Medicine

## 2014-02-25 DIAGNOSIS — IMO0001 Reserved for inherently not codable concepts without codable children: Secondary | ICD-10-CM | POA: Diagnosis not present

## 2014-02-25 NOTE — Progress Notes (Signed)
Occupational Therapy Treatment Patient Details  Name: Cody Wood MRN: [id number] Date of Birth: Jan 29, 1946  Today's Date: 02/25/2014 Time: 4098-1191 OT Time Calculation (min): 42 min Manual 4782-9562 (15') Therapeutic Exercises 1321-1348 (55')  Visit#: 12 of 24  Re-eval: 02/25/14    Authorization: Medicare  Authorization Time Period: before 20th visit  Authorization Visit#: 12 of 20  Subjective Symptoms/Limitations Symptoms: "I'm doing pretty good." Limitations: begin AAROm week of 5/4 Pain Assessment Currently in Pain?: No/denies  Precautions/Restrictions     Exercise/Treatments Supine Protraction: PROM;10 reps;AAROM;15 reps Horizontal ABduction: PROM;10 reps;AAROM;15 reps External Rotation: PROM;10 reps;AAROM;15 reps Internal Rotation: PROM;10 reps;AAROM;15 reps Flexion: PROM;10 reps;AAROM;15 reps ABduction: PROM;10 reps;AAROM;15 reps Seated Elevation: AROM;15 reps Extension: AROM;15 reps Row: AROM;15 reps Protraction: AAROM;12 reps Horizontal ABduction: AAROM;12 reps External Rotation: AAROM;12 reps Internal Rotation: AAROM;12 reps Flexion: AAROM;12 reps Abduction: AAROM;12 reps Pulleys Flexion: 1 minute ABduction: 1 minute ROM / Strengthening / Isometric Strengthening Wall Wash: 2'   Manual Therapy Manual Therapy: Myofascial release Myofascial Release: MFR and manual stretching to left upper arm, trapezius, and scapularis region to decrease fascial restrictions and increase joint mobility in a pain free zone.      Occupational Therapy Assessment and Plan OT Assessment and Plan Clinical Impression Statement: Pt mentioned mole on anterior right shoulder had changed colors and increased in size. Observed discoloration, and encouraged pt to make apointment with family MD to look at it. Pt entioend he has not been doing abudction exercises at home - encouraged to continue abduction HEP. Increased reps with seated AAROM with good tolerance and little pain  or fatigue.  Continued pulleys and wall wash with good tolerance. OT Plan: Follow up on MD appointment for mole. Increase pulley time.  Provided pt with dowel exercises handout - follow up on HEP.   Goals Short Term Goals Short Term Goal 1: Patient will be educated on HEP.  Short Term Goal 1 Progress: Progressing toward goal Short Term Goal 2: Patient will report a pain level of 7/10 when completing daily tasks.  Short Term Goal 2 Progress: Progressing toward goal Short Term Goal 3: Patient will increase PROM to [facility] to increase ability to put shirt on/off with less difficulty. Short Term Goal 3 Progress: Progressing toward goal Short Term Goal 4: Patient will increase RUE shoulder strength to 3+/5 to increase ability to lift heavy items. Short Term Goal 4 Progress: Progressing toward goal Short Term Goal 5: Patient will decrease fascial restrictions in RUE from max to min-mod. Short Term Goal 5 Progress: Progressing toward goal Long Term Goals Long Term Goal 1: Patient will return to highest level of independence with all B/IADL and leisure activities.  Long Term Goal 1 Progress: Progressing toward goal Long Term Goal 2: Patient will report a pain level of 3/10 or less when driving stick shift in car. Long Term Goal 2 Progress: Progressing toward goal Long Term Goal 3: Patient will increase AROM to [facility] to increase ability to reach up into overhead cabinets with less difficulty.  Long Term Goal 3 Progress: Progressing toward goal Long Term Goal 4: Patient will increase RUE shoulder strength to 4/5 to increase ability to lift heavy items.  Long Term Goal 4 Progress: Progressing toward goal Long Term Goal 5: Patient will decrease fascial restrictions in RUE from min-mod to min. Long Term Goal 5 Progress: Progressing toward goal  Problem List Patient Active Problem List   Diagnosis Date Noted  . Muscle weakness (generalized) 01/28/2014  . Pain in joint, upper  arm 01/28/2014  . Decreased  range of motion of right shoulder 01/28/2014  . Closed fracture of right proximal humerus 12/23/2013  . Personal history of falling, presenting hazards to health 05/05/2012  . Bilateral leg weakness 05/05/2012  . Hodgkin's lymphoma 04/29/2012  . Chest pain on exertion 09/03/2011    End of Session Activity Tolerance: Patient tolerated treatment well General Behavior During Therapy: [facility] for tasks assessed/performed  GO    Bea Graff, MS, OTR/L [phone]  02/25/2014, 1:49 PM

## 2014-02-28 ENCOUNTER — Ambulatory Visit (HOSPITAL_COMMUNITY): Payer: Medicare Other

## 2014-03-02 ENCOUNTER — Ambulatory Visit (HOSPITAL_COMMUNITY)
Admission: RE | Admit: 2014-03-02 | Discharge: 2014-03-02 | Disposition: A | Discharge status: Home / Self Care | Payer: Medicare Other | Source: Ambulatory Visit | Attending: Orthopedic Surgery | Admitting: Orthopedic Surgery

## 2014-03-02 DIAGNOSIS — S42209A Unspecified fracture of upper end of unspecified humerus, initial encounter for closed fracture: Secondary | ICD-10-CM | POA: Insufficient documentation

## 2014-03-02 DIAGNOSIS — Z4789 Encounter for other orthopedic aftercare: Secondary | ICD-10-CM | POA: Insufficient documentation

## 2014-03-02 DIAGNOSIS — IMO0001 Reserved for inherently not codable concepts without codable children: Secondary | ICD-10-CM | POA: Insufficient documentation

## 2014-03-02 DIAGNOSIS — X58XXXA Exposure to other specified factors, initial encounter: Secondary | ICD-10-CM | POA: Insufficient documentation

## 2014-03-02 NOTE — Evaluation (Signed)
Occupational Therapy Reassessment and Treatment   Patient Details  Name: Cody Wood MRN: [id number] Date of Birth: 1946-09-24  Today's Date: 03/02/2014 Time: 6553-7482 OT Time Calculation (min): 40 min MFR 7078-6754 10' Reassess 1345-1505 20' Therex 4920-1007 10'  Visit#: 13 of 24  Re-eval: 03/30/14  Assessment Diagnosis: s/p Right shoulder fx  Authorization: Medicare  Authorization Time Period: before 20th visit  Authorization Visit#: 34 of 67   Past Medical History:  Past Medical History  Diagnosis Date  . Diabetes mellitus   . Blockage of coronary artery of heart   . Hypertension   . hodgkins lymphoma dx'd 06/2008   Past Surgical History:  Past Surgical History  Procedure Laterality Date  . Knee surgery    . Mandible surgery    . Neck surgery      Subjective Symptoms/Limitations Symptoms: S: I was painting a lot yesterday. I didn't think i was going to be able to with this arm. But I did! Pain Assessment Currently in Pain?: No/denies  Precautions/Restrictions  Precautions Precautions: None Precaution Comments: PROM for 6 weeks. Starting the week of May 4th, may start AAROM if pain free. progress as tolerated.   Assessment Additional Assessments RUE Assessment RUE Assessment:  (assessed supine. ER/IR adducted) RUE AROM (degrees) Right Shoulder Flexion: 140 Degrees Right Shoulder ABduction: 85 Degrees Right Shoulder Internal Rotation: 90 Degrees Right Shoulder External Rotation: 90 Degrees RUE PROM (degrees) Right Shoulder Flexion: 155 Degrees Right Shoulder ABduction: 92 Degrees Right Shoulder Internal Rotation: 90 Degrees Right Shoulder External Rotation: 90 Degrees RUE Strength Right Shoulder Flexion:  (4-/5 (3-/5 on eval)) Right Shoulder ABduction:  (4-/5 ( 3-/5 on eval)) Right Shoulder Internal Rotation:  (4-/5 ( 3/5 on eval)) Right Shoulder External Rotation: 3+/5 ( (3-/5 on eval)) Palpation Palpation: mod fascial restrictions in right  upper arm, trapezius, and scapularis region.      Exercise/Treatments Seated Protraction: AAROM;15 reps Horizontal ABduction: AAROM;15 reps External Rotation: AAROM;15 reps Internal Rotation: AAROM;15 reps Flexion: AAROM;15 reps Abduction: AAROM;15 reps ROM / Strengthening / Isometric Strengthening Proximal Shoulder Strengthening, Seated: 10X      Manual Therapy Manual Therapy: Myofascial release Myofascial Release: Myofascial release and manual stretching to left upper arm, trapezius, and scapularis region to decrease fascial restrictions and increase joint mobility in a pain free zone  Occupational Therapy Assessment and Plan OT Assessment and Plan Clinical Impression Statement: A: Reassessment completed this date. Patient met 5/5 STGs and 1/5 LTGs. Patient reports that he is able to raise his arm up now which he wasn't able to do after surgery. Patient is using his Right arm for all daily tasks as much as he can. Patient has increased  strength in all shoulder ranges. Recommend patient continue therapy for 3x/week for 4 weeks to continue to work on increasing shoulder strength and stability.   OT Frequency: Min 3X/week OT Duration: 4 weeks OT Plan: P: AROM supine. Add UBE bike. Hughston exericses   Goals Short Term Goals Time to Complete Short Term Goals: 4 weeks Short Term Goal 1: Patient will be educated on HEP.  Short Term Goal 1 Progress: Met Short Term Goal 2: Patient will report a pain level of 7/10 when completing daily tasks.  Short Term Goal 2 Progress: Met Short Term Goal 3: Patient will increase PROM to [facility] to increase ability to put shirt on/off with less difficulty. Short Term Goal 3 Progress: Met Short Term Goal 4: Patient will increase RUE shoulder strength to 3+/5 to increase ability to lift  heavy items. Short Term Goal 4 Progress: Met Short Term Goal 5: Patient will decrease fascial restrictions in RUE from max to min-mod. Short Term Goal 5 Progress:  Met Long Term Goals Time to Complete Long Term Goals: 8 weeks Long Term Goal 1: Patient will return to highest level of independence with all B/IADL and leisure activities.  Long Term Goal 1 Progress: Progressing toward goal Long Term Goal 2: Patient will report a pain level of 3/10 or less when during daily activities.  Long Term Goal 2 Progress: Met Long Term Goal 3: Patient will increase AROM to [facility] to increase ability to reach up into overhead cabinets with less difficulty.  Long Term Goal 3 Progress: Progressing toward goal Long Term Goal 4: Patient will increase RUE shoulder strength to 4/5 to increase ability to lift heavy items.  Long Term Goal 4 Progress: Progressing toward goal Long Term Goal 5: Patient will decrease fascial restrictions in RUE from min-mod to min. Long Term Goal 5 Progress: Progressing toward goal  Problem List Patient Active Problem List   Diagnosis Date Noted  . Muscle weakness (generalized) 01/28/2014  . Pain in joint, upper arm 01/28/2014  . Decreased range of motion of right shoulder 01/28/2014  . Closed fracture of right proximal humerus 12/23/2013  . Personal history of falling, presenting hazards to health 05/05/2012  . Bilateral leg weakness 05/05/2012  . Hodgkin's lymphoma 04/29/2012  . Chest pain on exertion 09/03/2011    End of Session Activity Tolerance: Patient tolerated treatment well General Behavior During Therapy: [facility] for tasks assessed/performed  GO Functional Assessment Tool Used: FOTO score: 68/100 (last progress note: 61/100) Functional Limitation: Carrying, moving and handling objects Carrying, Moving and Handling Objects Current Status ([id number]): At least 20 percent but less than 40 percent impaired, limited or restricted Carrying, Moving and Handling Objects Goal Status [phone]): At least 1 percent but less than 20 percent impaired, limited or restricted  Ailene Ravel, OTR/L,CBIS   03/02/2014, 3:22 PM  Physician  Documentation Your signature is required to indicate approval of the treatment plan as stated above.  Please sign and either send electronically or make a copy of this report for your files and return this physician signed original.  Please mark one 1.__approve of plan  2. ___approve of plan with the following conditions.   ______________________________                                                          _____________________ Physician Signature                                                                                                             Date

## 2014-03-04 ENCOUNTER — Ambulatory Visit (HOSPITAL_COMMUNITY)
Admission: RE | Admit: 2014-03-04 | Discharge: 2014-03-04 | Disposition: A | Discharge status: Home / Self Care | Payer: Medicare Other | Source: Ambulatory Visit | Attending: Orthopedic Surgery | Admitting: Orthopedic Surgery

## 2014-03-04 NOTE — Progress Notes (Signed)
Occupational Therapy Treatment Patient Details  Name: Cody Wood MRN: [id number] Date of Birth: 1946-01-31  Today's Date: 03/04/2014 Time: 1191-4782 OT Time Calculation (min): 40 min Manual 1352-1404 (12') Therapeutic Exercises 9562-1308 (32')  Visit#: 14 of 24  Re-eval: 03/30/14    Authorization: Medicare  Authorization Time Period: before 20th visit  Authorization Visit#: 14 of 20  Subjective Symptoms/Limitations Symptoms: "I'm doing better.  Just certain ways that I move it it feels like its catching in there." Limitations: begin AAROm week of 5/4 Pain Assessment Currently in Pain?: No/denies  Precautions/Restrictions     Exercise/Treatments Supine Protraction: PROM;AROM;10 reps Horizontal ABduction: PROM;AROM;10 reps External Rotation: PROM;AROM;10 reps Internal Rotation: PROM;AROM;10 reps Flexion: PROM;AROM;10 reps ABduction: PROM;AROM;10 reps Standing Protraction: AAROM;15 reps Horizontal ABduction: AAROM;15 reps External Rotation: AAROM;15 reps Internal Rotation: AAROM;15 reps Flexion: AAROM;15 reps ABduction: AAROM;15 reps Pulleys Flexion: 2 minutes ABduction: 2 minutes ROM / Strengthening / Isometric Strengthening Wall Wash: 2' in flexion and 1' in abduction Proximal Shoulder Strengthening, Supine: 10X   Manual Therapy Manual Therapy: Myofascial release Myofascial Release: Myofascial release and manual stretching to left upper arm, trapezius, and scapularis region to decrease fascial restrictions and increase joint mobility in a pain free zone.    Occupational Therapy Assessment and Plan OT Assessment and Plan Clinical Impression Statement: Added supine AROM this session. pt had good tolerance.  Good tolerance of proximal shoulder strengthening in supine.  Added wall wash in abduction - pt ahd fatigue, but good toelrance.  Increased pulley time to 2 min each.  did not add UBE/Hughston due to timing. OT Plan: Add UBE, Hugh[place] exercises.    Goals Short Term Goals Short Term Goal 1: Patient will be educated on HEP.  Short Term Goal 1 Progress: Met Short Term Goal 2: Patient will report a pain level of 7/10 when completing daily tasks.  Short Term Goal 2 Progress: Met Short Term Goal 3: Patient will increase PROM to [facility] to increase ability to put shirt on/off with less difficulty. Short Term Goal 3 Progress: Met Short Term Goal 4: Patient will increase RUE shoulder strength to 3+/5 to increase ability to lift heavy items. Short Term Goal 4 Progress: Met Short Term Goal 5: Patient will decrease fascial restrictions in RUE from max to min-mod. Short Term Goal 5 Progress: Met Long Term Goals Long Term Goal 1: Patient will return to highest level of independence with all B/IADL and leisure activities.  Long Term Goal 1 Progress: Progressing toward goal Long Term Goal 2: Patient will report a pain level of 3/10 or less when during daily activities.  Long Term Goal 2 Progress: Met Long Term Goal 3: Patient will increase AROM to [facility] to increase ability to reach up into overhead cabinets with less difficulty.  Long Term Goal 3 Progress: Progressing toward goal Long Term Goal 4: Patient will increase RUE shoulder strength to 4/5 to increase ability to lift heavy items.  Long Term Goal 4 Progress: Progressing toward goal Long Term Goal 5: Patient will decrease fascial restrictions in RUE from min-mod to min. Long Term Goal 5 Progress: Progressing toward goal  Problem List Patient Active Problem List   Diagnosis Date Noted  . Muscle weakness (generalized) 01/28/2014  . Pain in joint, upper arm 01/28/2014  . Decreased range of motion of right shoulder 01/28/2014  . Closed fracture of right proximal humerus 12/23/2013  . Personal history of falling, presenting hazards to health 05/05/2012  . Bilateral leg weakness 05/05/2012  . Hodgkin's lymphoma 04/29/2012  .  Chest pain on exertion 09/03/2011    End of Session Activity  Tolerance: Patient tolerated treatment well General Behavior During Therapy: [facility] for tasks assessed/performed  GO    Bea Graff, [place], OTR/L [phone]  03/04/2014, 2:30 PM

## 2014-03-07 ENCOUNTER — Ambulatory Visit (HOSPITAL_COMMUNITY)
Admission: RE | Admit: 2014-03-07 | Discharge: 2014-03-07 | Disposition: A | Discharge status: Home / Self Care | Payer: Medicare Other | Source: Ambulatory Visit | Attending: Orthopedic Surgery | Admitting: Orthopedic Surgery

## 2014-03-07 NOTE — Progress Notes (Signed)
Occupational Therapy Treatment Patient Details  Name: Cody Wood MRN: [id number] Date of Birth: October 27, 1945  Today's Date: 03/07/2014 Time: 7412-8786 OT Time Calculation (min): 42 min Manual 1305-1320 (15') Therapeutic Exercises 1320-1347 (2')  Visit#: 15 of 24  Re-eval: 03/30/14    Authorization: Medicare  Authorization Time Period: before 20th visit  Authorization Visit#: 15 of 20  Subjective Symptoms/Limitations Symptoms: "Its popped over the weekend and it hurt like the dickens.  Its getting better." Pain Assessment Currently in Pain?: Yes Pain Score: 1  Pain Location: Shoulder Pain Orientation: Right Pain Type: Acute pain  Precautions/Restrictions     Exercise/Treatments Supine Protraction: PROM;10 reps;AROM;12 reps Horizontal ABduction: PROM;10 reps;AROM;12 reps External Rotation: PROM;10 reps;AROM;12 reps Internal Rotation: PROM;10 reps;AROM;12 reps Flexion: PROM;10 reps;AROM;12 reps ABduction: PROM;10 reps;AROM;12 reps (decreased AROM) Prone  Other Prone Exercises: Hughston exercises 1-5. 10 reps each.  Pt declined to lay on stomach, so exercises preformed in standing, bending over, with left arm supported on counter Standing Protraction: AAROM;15 reps Horizontal ABduction: AAROM;15 reps External Rotation: AAROM;15 reps Internal Rotation: AAROM;15 reps Flexion: AAROM;15 reps ABduction: AAROM;15 reps ROM / Strengthening / Isometric Strengthening UBE (Upper Arm Bike): 2 min each forward and backward at 1.0 Proximal Shoulder Strengthening, Supine: 10X   Manual Therapy Manual Therapy: Myofascial release Myofascial Release: Myofascial release and manual stretching to left upper arm, trapezius, and scapularis region to decrease fascial restrictions and increase joint mobility in a pain free zone.    Occupational Therapy Assessment and Plan OT Assessment and Plan Clinical Impression Statement: Increased supine AROM reps with no increased pain or fatigue.   Added [place] exercises, in bent over standing - pt had good tolerance, verbalized feeling good stretch, and had mod fatigue.  Added UBE this session, and pt had good tolerance. OT Plan: Attempt AROM in standing.   Goals Short Term Goals Short Term Goal 1: Patient will be educated on HEP.  Short Term Goal 1 Progress: Met Short Term Goal 2: Patient will report a pain level of 7/10 when completing daily tasks.  Short Term Goal 2 Progress: Met Short Term Goal 3: Patient will increase PROM to [facility] to increase ability to put shirt on/off with less difficulty. Short Term Goal 3 Progress: Met Short Term Goal 4: Patient will increase RUE shoulder strength to 3+/5 to increase ability to lift heavy items. Short Term Goal 4 Progress: Met Short Term Goal 5: Patient will decrease fascial restrictions in RUE from max to min-mod. Short Term Goal 5 Progress: Met Long Term Goals Long Term Goal 1: Patient will return to highest level of independence with all B/IADL and leisure activities.  Long Term Goal 1 Progress: Progressing toward goal Long Term Goal 2: Patient will report a pain level of 3/10 or less when during daily activities.  Long Term Goal 2 Progress: Met Long Term Goal 3: Patient will increase AROM to [facility] to increase ability to reach up into overhead cabinets with less difficulty.  Long Term Goal 3 Progress: Progressing toward goal Long Term Goal 4: Patient will increase RUE shoulder strength to 4/5 to increase ability to lift heavy items.  Long Term Goal 4 Progress: Progressing toward goal Long Term Goal 5: Patient will decrease fascial restrictions in RUE from min-mod to min. Long Term Goal 5 Progress: Progressing toward goal  Problem List Patient Active Problem List   Diagnosis Date Noted  . Muscle weakness (generalized) 01/28/2014  . Pain in joint, upper arm 01/28/2014  . Decreased range of motion of  right shoulder 01/28/2014  . Closed fracture of right proximal humerus 12/23/2013  .  Personal history of falling, presenting hazards to health 05/05/2012  . Bilateral leg weakness 05/05/2012  . Hodgkin's lymphoma 04/29/2012  . Chest pain on exertion 09/03/2011    End of Session Activity Tolerance: Patient tolerated treatment well General Behavior During Therapy: [facility] for tasks assessed/performed  GO   Bea Graff, MS, OTR/L [phone]  03/07/2014, 2:57 PM

## 2014-03-09 ENCOUNTER — Ambulatory Visit (HOSPITAL_COMMUNITY)
Admission: RE | Admit: 2014-03-09 | Discharge: 2014-03-09 | Disposition: A | Discharge status: Home / Self Care | Payer: Medicare Other | Source: Ambulatory Visit | Attending: Family Medicine | Admitting: Family Medicine

## 2014-03-09 NOTE — Progress Notes (Signed)
Occupational Therapy Treatment Patient Details  Name: Cody Wood MRN: [id number] Date of Birth: September 03, 1946  Today's Date: 03/09/2014 Time: 8182-9937 OT Time Calculation (min): 38 min Manual therapy 1696-7893 14' Therapeutic exercises 8101-7510 30'  Visit#: 16 of 24  Re-eval: 03/30/14    Authorization: Medicare  Authorization Time Period: before 20th visit  Authorization Visit#: 16 of 20  Subjective    Precautions/Restrictions     Exercise/Treatments Supine Protraction: PROM;10 reps;AROM;15 reps Horizontal ABduction: PROM;10 reps;AROM;15 reps External Rotation: PROM;10 reps;AROM;15 reps Internal Rotation: PROM;10 reps;AROM;15 reps Flexion: PROM;10 reps;AROM;15 reps ABduction: PROM;10 reps;AROM;15 reps Seated Protraction: AROM;10 reps Horizontal ABduction: AROM;10 reps External Rotation: AROM;10 reps Internal Rotation: AROM;10 reps Flexion: AROM;10 reps Abduction: AROM;10 reps ROM / Strengthening / Isometric Strengthening UBE (Upper Arm Bike): 54min forward and 3 min reverse at 1.0 intensity Cybex Press: 1 plate;10 reps Cybex Row: 1 plate;10 reps Wall Wash: 2 minutes Proximal Shoulder Strengthening, Supine: 10 times Proximal Shoulder Strengthening, Seated: 10 times                Manual Therapy Manual Therapy: Myofascial release Myofascial Release: Myofascial release and manual stretching to left upper arm, trapezius, and scapularis region to decrease fascial restrictions and increase joint mobility in a pain free zone.   Occupational Therapy Assessment and Plan OT Assessment and Plan Clinical Impression Statement: A:  pain with horizontal adduction this date.  transitioned to AROM in standing this date. OT Plan: P:  Add ball on wall and theraband for scapular stability.   Goals Short Term Goals Short Term Goal 1: Patient will be educated on HEP.  Short Term Goal 2: Patient will report a pain level of 7/10 when completing daily tasks.  Short Term  Goal 3: Patient will increase PROM to [facility] to increase ability to put shirt on/off with less difficulty. Short Term Goal 4: Patient will increase RUE shoulder strength to 3+/5 to increase ability to lift heavy items. Short Term Goal 5: Patient will decrease fascial restrictions in RUE from max to min-mod. Long Term Goals Long Term Goal 1: Patient will return to highest level of independence with all B/IADL and leisure activities.  Long Term Goal 2: Patient will report a pain level of 3/10 or less when during daily activities.  Long Term Goal 3: Patient will increase AROM to [facility] to increase ability to reach up into overhead cabinets with less difficulty.  Long Term Goal 4: Patient will increase RUE shoulder strength to 4/5 to increase ability to lift heavy items.  Long Term Goal 5: Patient will decrease fascial restrictions in RUE from min-mod to min.  Problem List Patient Active Problem List   Diagnosis Date Noted  . Muscle weakness (generalized) 01/28/2014  . Pain in joint, upper arm 01/28/2014  . Decreased range of motion of right shoulder 01/28/2014  . Closed fracture of right proximal humerus 12/23/2013  . Personal history of falling, presenting hazards to health 05/05/2012  . Bilateral leg weakness 05/05/2012  . Hodgkin's lymphoma 04/29/2012  . Chest pain on exertion 09/03/2011    End of Session Activity Tolerance: Patient tolerated treatment well General Behavior During Therapy: [facility] for tasks assessed/performed  [place], OTR/L [phone]  03/09/2014, 2:31 PM

## 2014-03-10 ENCOUNTER — Ambulatory Visit (HOSPITAL_COMMUNITY)
Admission: RE | Admit: 2014-03-10 | Discharge: 2014-03-10 | Disposition: A | Discharge status: Home / Self Care | Payer: Medicare Other | Source: Ambulatory Visit | Attending: Family Medicine | Admitting: Family Medicine

## 2014-03-10 NOTE — Progress Notes (Signed)
Occupational Therapy Treatment Patient Details  Name: Cody Wood MRN: [id number] Date of Birth: 1945/12/29  Today's Date: 03/10/2014 Time: 0347-4259 OT Time Calculation (min): 39 min MFR 851-905 14' Therex 905-930 25'  Visit#: 17 of 24  Re-eval: 03/30/14    Authorization: Medicare  Authorization Time Period: before 20th visit  Authorization Visit#: 17 of 20  Subjective    Precautions/Restrictions     Exercise/Treatments Supine Protraction: PROM;10 reps;AROM;15 reps Horizontal ABduction: PROM;10 reps;AROM;15 reps External Rotation: PROM;10 reps;AROM;15 reps Internal Rotation: PROM;10 reps;AROM;15 reps Flexion: PROM;10 reps;AROM;15 reps ABduction: PROM;10 reps;AROM;15 reps Seated Protraction: AROM;10 reps Horizontal ABduction: AROM;10 reps External Rotation: AROM;10 reps Internal Rotation: AROM;10 reps Flexion: AROM;10 reps Abduction: AROM;10 reps ROM / Strengthening / Isometric Strengthening UBE (Upper Arm Bike): 18min forward and 3 min reverse at 1.0 intensity Proximal Shoulder Strengthening, Supine: 10 times Proximal Shoulder Strengthening, Seated: 10 times        Manual Therapy Manual Therapy: Myofascial release Myofascial Release: Myofascial release and manual stretching to left upper arm, trapezius, and scapularis region to decrease fascial restrictions and increase joint mobility in a pain free zone.   Occupational Therapy Assessment and Plan OT Assessment and Plan Clinical Impression Statement: A: Patient continues to have increased pain at end of stretch with passive stretching. Pain felt mainly with flexion, abduction, and horizontal adduction. DId not add ball on the wall or therabance this date due to time constraint.  OT Plan: P:  Add ball on wall and theraband for scapular stability.   Goals Short Term Goals Time to Complete Short Term Goals: 4 weeks Short Term Goal 1: Patient will be educated on HEP.  Short Term Goal 2: Patient will report  a pain level of 7/10 when completing daily tasks.  Short Term Goal 3: Patient will increase PROM to [facility] to increase ability to put shirt on/off with less difficulty. Short Term Goal 4: Patient will increase RUE shoulder strength to 3+/5 to increase ability to lift heavy items. Short Term Goal 5: Patient will decrease fascial restrictions in RUE from max to min-mod. Long Term Goals Time to Complete Long Term Goals: 8 weeks Long Term Goal 1: Patient will return to highest level of independence with all B/IADL and leisure activities.  Long Term Goal 1 Progress: Progressing toward goal Long Term Goal 2: Patient will report a pain level of 3/10 or less when during daily activities.  Long Term Goal 3: Patient will increase AROM to [facility] to increase ability to reach up into overhead cabinets with less difficulty.  Long Term Goal 3 Progress: Progressing toward goal Long Term Goal 4: Patient will increase RUE shoulder strength to 4/5 to increase ability to lift heavy items.  Long Term Goal 4 Progress: Progressing toward goal Long Term Goal 5: Patient will decrease fascial restrictions in RUE from min-mod to min. Long Term Goal 5 Progress: Progressing toward goal  Problem List Patient Active Problem List   Diagnosis Date Noted  . Muscle weakness (generalized) 01/28/2014  . Pain in joint, upper arm 01/28/2014  . Decreased range of motion of right shoulder 01/28/2014  . Closed fracture of right proximal humerus 12/23/2013  . Personal history of falling, presenting hazards to health 05/05/2012  . Bilateral leg weakness 05/05/2012  . Hodgkin's lymphoma 04/29/2012  . Chest pain on exertion 09/03/2011    End of Session Activity Tolerance: Patient tolerated treatment well General Behavior During Therapy: [facility] for tasks assessed/performed   Ailene Ravel, OTR/L,CBIS   03/10/2014, 9:28 AM

## 2014-03-11 ENCOUNTER — Ambulatory Visit (HOSPITAL_COMMUNITY): Payer: Medicare Other

## 2014-03-14 ENCOUNTER — Ambulatory Visit (HOSPITAL_COMMUNITY)
Admission: RE | Admit: 2014-03-14 | Discharge: 2014-03-14 | Disposition: A | Discharge status: Home / Self Care | Payer: Medicare Other | Source: Ambulatory Visit | Attending: Family Medicine | Admitting: Family Medicine

## 2014-03-14 NOTE — Progress Notes (Signed)
Occupational Therapy Treatment Patient Details  Name: Cody Wood MRN: [id number] Date of Birth: 1946/08/23  Today's Date: 03/14/2014 Time: 1497-0263 OT Time Calculation (min): 40 min MFR 1305-1320 15' Therex 7858-8502 25'  Visit#: 18 of 24  Re-eval: 03/30/14    Authorization: Medicare  Authorization Time Period: before 20th visit  Authorization Visit#: 18 of 20  Subjective Symptoms/Limitations Symptoms: S: My shoulder locked up on me Saturday. I couldn't move it and I had to have my son unlock it for me.  Pain Assessment Currently in Pain?: No/denies  Precautions/Restrictions  Precautions Precautions: None Precaution Comments: PROM for 6 weeks. Starting the week of May 4th, may start AAROM if pain free. progress as tolerated.  Exercise/Treatments Supine Protraction: PROM;10 reps;AROM;15 reps Horizontal ABduction: PROM;10 reps;AROM;15 reps External Rotation: PROM;10 reps;AROM;15 reps Internal Rotation: PROM;10 reps;AROM;15 reps Flexion: PROM;10 reps;AROM;15 reps ABduction: PROM;10 reps;AROM;15 reps Seated Protraction: AROM;10 reps Horizontal ABduction: AROM;10 reps External Rotation: AROM;10 reps Internal Rotation: AROM;10 reps Flexion: AROM;10 reps Abduction: AROM;10 reps ROM / Strengthening / Isometric Strengthening UBE (Upper Arm Bike): level 1 3' forward 3' reverse Cybex Press: 1 plate;10 reps Cybex Row: 1 plate;10 reps Wall Wash: 2 minutes Proximal Shoulder Strengthening, Supine: 10 times Proximal Shoulder Strengthening, Seated: 10 times       Manual Therapy Manual Therapy: Myofascial release Myofascial Release: Myofascial release and manual stretching to left upper arm, trapezius, and scapularis region to decrease fascial restrictions and increase joint mobility in a pain free zone.   Occupational Therapy Assessment and Plan OT Assessment and Plan Clinical Impression Statement: A: Patient had some shoulder fatigue during wall washing this date.  DId not add ball on the wall or theraband due to time constraint.  OT Plan: P: Reassess for MD appt.  Add ball on wall and theraband for scapular stability.   Goals Short Term Goals Time to Complete Short Term Goals: 4 weeks Short Term Goal 1: Patient will be educated on HEP.  Short Term Goal 2: Patient will report a pain level of 7/10 when completing daily tasks.  Short Term Goal 3: Patient will increase PROM to [facility] to increase ability to put shirt on/off with less difficulty. Short Term Goal 4: Patient will increase RUE shoulder strength to 3+/5 to increase ability to lift heavy items. Short Term Goal 5: Patient will decrease fascial restrictions in RUE from max to min-mod. Long Term Goals Time to Complete Long Term Goals: 8 weeks Long Term Goal 1: Patient will return to highest level of independence with all B/IADL and leisure activities.  Long Term Goal 1 Progress: Progressing toward goal Long Term Goal 2: Patient will report a pain level of 3/10 or less when during daily activities.  Long Term Goal 3: Patient will increase AROM to [facility] to increase ability to reach up into overhead cabinets with less difficulty.  Long Term Goal 3 Progress: Progressing toward goal Long Term Goal 4: Patient will increase RUE shoulder strength to 4/5 to increase ability to lift heavy items.  Long Term Goal 4 Progress: Progressing toward goal Long Term Goal 5: Patient will decrease fascial restrictions in RUE from min-mod to min. Long Term Goal 5 Progress: Progressing toward goal  Problem List Patient Active Problem List   Diagnosis Date Noted  . Muscle weakness (generalized) 01/28/2014  . Pain in joint, upper arm 01/28/2014  . Decreased range of motion of right shoulder 01/28/2014  . Closed fracture of right proximal humerus 12/23/2013  . Personal history of falling, presenting hazards  to health 05/05/2012  . Bilateral leg weakness 05/05/2012  . Hodgkin's lymphoma 04/29/2012  . Chest pain on exertion  09/03/2011    End of Session Activity Tolerance: Patient tolerated treatment well General Behavior During Therapy: [facility] for tasks assessed/performed   Ailene Ravel, OTR/L,CBIS   03/14/2014, 1:45 PM

## 2014-03-16 ENCOUNTER — Ambulatory Visit (HOSPITAL_COMMUNITY)
Admission: RE | Admit: 2014-03-16 | Discharge: 2014-03-16 | Disposition: A | Discharge status: Home / Self Care | Payer: Medicare Other | Source: Ambulatory Visit | Attending: Family Medicine | Admitting: Family Medicine

## 2014-03-16 NOTE — Evaluation (Signed)
Occupational Therapy Re-Evaluation  Patient Details  Name: Cody Wood MRN: [id number] Date of Birth: 1946-02-06  Today's Date: 03/16/2014 Time: 8099-8338 OT Time Calculation (min): 45 min Manual therapy 2505-3976 13' Therapeutic exercises 1318-1350 32'  Visit#: 19 of 24  Re-eval: 03/30/14  Assessment Diagnosis: s/p Right shoulder fx  Authorization: Medicare  Authorization Time Period: before 29th visit  Authorization Visit#: 35 of 30   Past Medical History:  Past Medical History  Diagnosis Date  . Diabetes mellitus   . Blockage of coronary artery of heart   . Hypertension   . hodgkins lymphoma dx'd 06/2008   Past Surgical History:  Past Surgical History  Procedure Laterality Date  . Knee surgery    . Mandible surgery    . Neck surgery      Subjective S:  I can use my arm much more.  I need to work it more for reaching overhead.  I have a PET scan at the end of the month and I need to be able to reach overhead. Limitations: begin AAROm week of 5/4 Special Tests: FOTO 60/100 Pain Assessment Currently in Pain?: Yes Pain Score: 2  Pain Location: Shoulder Pain Orientation: Right Pain Type: Acute pain  Assessment  Additional Assessments RUE AROM (degrees) RUE Overall AROM Comments: assessed in supine ER/Ir with shulder adducted  (03/02/14 supine) Right Shoulder Flexion: 150 Degrees (140) Right Shoulder ABduction: 115 Degrees (85) Right Shoulder Internal Rotation: 82 Degrees Right Shoulder External Rotation: 82 Degrees RUE PROM (degrees) Right Shoulder Flexion: 155 Degrees (155) Right Shoulder ABduction: 125 Degrees (92) Right Shoulder Internal Rotation: 90 Degrees (90) Right Shoulder External Rotation: 90 Degrees (90)     Exercise/Treatments Supine Protraction: PROM;Strengthening;10 reps Protraction Weight (lbs): 1 Horizontal ABduction: PROM;Strengthening;10 reps Horizontal ABduction Weight (lbs): 1 External Rotation: PROM;Strengthening;10  reps External Rotation Weight (lbs): 1 Internal Rotation: PROM;Strengthening;10 reps Internal Rotation Weight (lbs): 1 Flexion: PROM;Strengthening;10 reps (limited to 150 degrees AROM) Shoulder Flexion Weight (lbs): 1 ABduction: PROM;Strengthening;10 reps Shoulder ABduction Weight (lbs): 1 Standing External Rotation: Theraband;15 reps Theraband Level (Shoulder External Rotation): Level 2 (Red) Internal Rotation: Theraband;15 reps Theraband Level (Shoulder Internal Rotation): Level 2 (Red) Extension: Theraband;15 reps Theraband Level (Shoulder Extension): Level 2 (Red) Row: Theraband;15 reps Theraband Level (Shoulder Row): Level 2 (Red) Retraction: Theraband;15 reps Theraband Level (Shoulder Retraction): Level 2 (Red) ROM / Strengthening / Isometric Strengthening UBE (Upper Arm Bike): level 2 3' forward and 3' reverse Cybex Press: 1.5 plate;15 reps Cybex Row: 1.5 plate;15 reps Proximal Shoulder Strengthening, Supine: 10 times with 1# i Ball on Wall: 1' at 90 degrees flexion and 1' at 90 degrees abduction        Manual Therapy Manual Therapy: Myofascial release Myofascial Release: Myofascial release and manual stretching to left upper arm, trapezius, and scapularis region to decrease fascial restrictions and increase joint mobility in a pain free zone.   Occupational Therapy Assessment and Plan OT Assessment and Plan Clinical Impression Statement: A:  Reassessment completed this date, patient had significant improvements in AROM this date. Began strengthening exercises in supine this date. OT Plan: P:  add overhead lace for sustained activity tolerance overhead in prep for his PET scan   Goals Short Term Goals Time to Complete Short Term Goals: 4 weeks Short Term Goal 1: Patient will be educated on HEP.  Short Term Goal 1 Progress: Met Short Term Goal 2: Patient will report a pain level of 7/10 when completing daily tasks.  Short Term Goal 2 Progress: Met Short  Term Goal 3:  Patient will increase PROM to [facility] to increase ability to put shirt on/off with less difficulty. Short Term Goal 3 Progress: Met Short Term Goal 4: Patient will increase RUE shoulder strength to 3+/5 to increase ability to lift heavy items. Short Term Goal 4 Progress: Met Short Term Goal 5: Patient will decrease fascial restrictions in RUE from max to min-mod. Short Term Goal 5 Progress: Met Long Term Goals Time to Complete Long Term Goals: 8 weeks Long Term Goal 1: Patient will return to highest level of independence with all B/IADL and leisure activities.  Long Term Goal 1 Progress: Progressing toward goal Long Term Goal 2: Patient will report a pain level of 3/10 or less when during daily activities.  Long Term Goal 2 Progress: Met Long Term Goal 3: Patient will increase AROM to [facility] to increase ability to reach up into overhead cabinets with less difficulty.  Long Term Goal 3 Progress: Progressing toward goal Long Term Goal 4: Patient will increase RUE shoulder strength to 4/5 to increase ability to lift heavy items.  Long Term Goal 4 Progress: Progressing toward goal Long Term Goal 5: Patient will decrease fascial restrictions in RUE from min-mod to min. Long Term Goal 5 Progress: Progressing toward goal  Problem List Patient Active Problem List   Diagnosis Date Noted  . Muscle weakness (generalized) 01/28/2014  . Pain in joint, upper arm 01/28/2014  . Decreased range of motion of right shoulder 01/28/2014  . Closed fracture of right proximal humerus 12/23/2013  . Personal history of falling, presenting hazards to health 05/05/2012  . Bilateral leg weakness 05/05/2012  . Hodgkin's lymphoma 04/29/2012  . Chest pain on exertion 09/03/2011    End of Session Activity Tolerance: Patient tolerated treatment well General Behavior During Therapy: [facility] for tasks assessed/performed  GO Functional Assessment Tool Used: FOTO scored 60/100 was 68/100 Functional Limitation: Carrying,  moving and handling objects Carrying, Moving and Handling Objects Current Status (V7793): At least 20 percent but less than 40 percent impaired, limited or restricted Carrying, Moving and Handling Objects Goal Status [phone]): At least 1 percent but less than 20 percent impaired, limited or restricted  Vangie Bicker, OTR/L [phone]  03/16/2014, 1:55 PM  Physician Documentation Your signature is required to indicate approval of the treatment plan as stated above.  Please sign and either send electronically or make a copy of this report for your files and return this physician signed original.  Please mark one 1.__approve of plan  2. ___approve of plan with the following conditions.   ______________________________                                                          _____________________ Physician Signature  Date sbnr

## 2014-03-17 ENCOUNTER — Ambulatory Visit (INDEPENDENT_AMBULATORY_CARE_PROVIDER_SITE_OTHER): Payer: Self-pay | Admitting: Orthopedic Surgery

## 2014-03-17 VITALS — BP 117/61 | Ht 68.0 in | Wt 230.0 lb

## 2014-03-17 DIAGNOSIS — S42201A Unspecified fracture of upper end of right humerus, initial encounter for closed fracture: Secondary | ICD-10-CM

## 2014-03-17 DIAGNOSIS — S42209A Unspecified fracture of upper end of unspecified humerus, initial encounter for closed fracture: Secondary | ICD-10-CM

## 2014-03-17 NOTE — Patient Instructions (Addendum)
Continue therapy x 1 month (at [facility])

## 2014-03-17 NOTE — Progress Notes (Signed)
Patient ID: Cody Wood, male   DOB: [birth year], 68 y.o.   MRN: [id number]  Chief Complaint  Patient presents with  . Follow-up    Recheck right humerus s/p PT/OT    Right proximal humerus fracture back in March is 3 months out he is getting therapy doing better improved range of motion, therapy notes reviewed. They recommend another month of therapy I think would be beneficial to improve his strength and continue improvements on his range of motion  Followup in 6 weeks  Orders Placed This Encounter  Procedures  . Ambulatory referral to Occupational Therapy    Referral Priority:  Routine    Referral Type:  Occupational Therapy    Referral Reason:  Specialty Services Required    Requested Specialty:  Occupational Therapy    Number of Visits Requested:  1

## 2014-03-18 ENCOUNTER — Ambulatory Visit (HOSPITAL_COMMUNITY)
Admission: RE | Admit: 2014-03-18 | Discharge: 2014-03-18 | Disposition: A | Discharge status: Home / Self Care | Payer: Medicare Other | Source: Ambulatory Visit | Attending: Family Medicine | Admitting: Family Medicine

## 2014-03-18 NOTE — Progress Notes (Signed)
Occupational Therapy Treatment Patient Details  Name: LOGUN COLAVITO MRN: [id number] Date of Birth: 02/28/1946  Today's Date: 03/18/2014 Time: 7494-4967 OT Time Calculation (min): 43 min Manual 1305-1320 (15') Therapeutic Exercises. 5916-3846 (28')  Visit#: 20 of 24  Re-eval: 03/30/14    Authorization: Medicare  Authorization Time Period: before 29th visit  Authorization Visit#: 20 of 29  Subjective Symptoms/Limitations Symptoms: it was hurting a little last night - I found a little two pound wieght and I think I've overdone it." Pain Assessment Currently in Pain?: No/denies  Precautions/Restrictions     Exercise/Treatments Supine Protraction: PROM;Strengthening;10 reps Protraction Weight (lbs): 1 Horizontal ABduction: PROM;Strengthening;10 reps Horizontal ABduction Weight (lbs): 1 External Rotation: PROM;Strengthening;10 reps External Rotation Weight (lbs): 1 Internal Rotation: PROM;Strengthening;10 reps Internal Rotation Weight (lbs): 1 Flexion: PROM;Strengthening;10 reps Shoulder Flexion Weight (lbs): 1 ABduction: PROM;Strengthening;10 reps Shoulder ABduction Weight (lbs): 1 Seated Protraction: AROM;12 reps Horizontal ABduction: AROM;12 reps External Rotation: AROM;12 reps Internal Rotation: AROM;12 reps Flexion: AROM;12 reps Abduction: AROM;12 reps Prone  Other Prone Exercises: [place] exericses 1-6 AROM 10 reps each.  preoformed in standing, bending over wtih with left arm suupported on the conter top rather than lyin gprone ROM / Strengthening / Isometric Strengthening Over Head Lace: 1 min, in sitting Proximal Shoulder Strengthening, Supine: 10 times with 1# Ball on Wall: 1' at 90 degrees flexion and 1' at 90 degrees abduction with weighted green ball   Manual Therapy Manual Therapy: Myofascial release Myofascial Release: Myofascial release and manual stretching to left upper arm, trapezius, and scapularis region to decrease fascial restrictions and  increase joint mobility in a pain free zone  Occupational Therapy Assessment and Plan OT Assessment and Plan Clinical Impression Statement: MD approved another month therapy at previous appointment.  continued 1# supine with good tolerance.  Added overhead lacing, with good tolerance, pt expressed fatigue.  Encouraged pt to speak with MD about mole growth on shoulder. OT Plan: P:  increase time with overhead lace for sustained activity tolerance overhead in prep for his PET scan. functional reaching activity into cabinet.   Goals Short Term Goals Short Term Goal 1: Patient will be educated on HEP.  Short Term Goal 1 Progress: Met Short Term Goal 2: Patient will report a pain level of 7/10 when completing daily tasks.  Short Term Goal 2 Progress: Met Short Term Goal 3: Patient will increase PROM to [facility] to increase ability to put shirt on/off with less difficulty. Short Term Goal 3 Progress: Met Short Term Goal 4: Patient will increase RUE shoulder strength to 3+/5 to increase ability to lift heavy items. Short Term Goal 4 Progress: Met Short Term Goal 5: Patient will decrease fascial restrictions in RUE from max to min-mod. Short Term Goal 5 Progress: Met Long Term Goals Long Term Goal 1: Patient will return to highest level of independence with all B/IADL and leisure activities.  Long Term Goal 1 Progress: Progressing toward goal Long Term Goal 2: Patient will report a pain level of 3/10 or less when during daily activities.  Long Term Goal 2 Progress: Met Long Term Goal 3: Patient will increase AROM to [facility] to increase ability to reach up into overhead cabinets with less difficulty.  Long Term Goal 3 Progress: Progressing toward goal Long Term Goal 4: Patient will increase RUE shoulder strength to 4/5 to increase ability to lift heavy items.  Long Term Goal 4 Progress: Progressing toward goal Long Term Goal 5: Patient will decrease fascial restrictions in RUE from min-mod to  min. Long  Term Goal 5 Progress: Progressing toward goal  Problem List Patient Active Problem List   Diagnosis Date Noted  . Muscle weakness (generalized) 01/28/2014  . Pain in joint, upper arm 01/28/2014  . Decreased range of motion of right shoulder 01/28/2014  . Closed fracture of right proximal humerus 12/23/2013  . Personal history of falling, presenting hazards to health 05/05/2012  . Bilateral leg weakness 05/05/2012  . Hodgkin's lymphoma 04/29/2012  . Chest pain on exertion 09/03/2011    End of Session Activity Tolerance: Patient tolerated treatment well General Behavior During Therapy: [facility] for tasks assessed/performed  GO    Bea Graff, MS, OTR/L [phone]  03/18/2014, 1:50 PM

## 2014-03-21 ENCOUNTER — Ambulatory Visit (HOSPITAL_COMMUNITY)
Admission: RE | Admit: 2014-03-21 | Discharge: 2014-03-21 | Disposition: A | Discharge status: Home / Self Care | Payer: Medicare Other | Source: Ambulatory Visit | Attending: Family Medicine | Admitting: Family Medicine

## 2014-03-21 NOTE — Progress Notes (Signed)
Occupational Therapy Treatment Patient Details  Name: Cody Wood MRN: [id number] Date of Birth: 1945/10/08  Today's Date: 03/21/2014 Time: 4854-6270 OT Time Calculation (min): 42 min MFR 3500-9381 8' Therex 8299-3716 34'  Visit#: 21 of 24  Re-eval: 03/30/14    Authorization: Medicare  Authorization Time Period: before 29th visit  Authorization Visit#: 21 of 29  Subjective Symptoms/Limitations Symptoms: S: There is still a certain way I move my shoulder that causes it to hurt. Pain Assessment Currently in Pain?: Yes Pain Score: 1  Pain Location: Shoulder Pain Orientation: Right Pain Type: Acute pain  Precautions/Restrictions  Precautions Precautions: None  Exercise/Treatments Supine Protraction: PROM;Strengthening;10 reps Protraction Weight (lbs): 1 Horizontal ABduction: PROM;Strengthening;10 reps Horizontal ABduction Weight (lbs): 1 External Rotation: PROM;Strengthening;10 reps External Rotation Weight (lbs): 1 Internal Rotation: PROM;Strengthening;10 reps Internal Rotation Weight (lbs): 1 Flexion: PROM;Strengthening;10 reps Shoulder Flexion Weight (lbs): 1 ABduction: PROM;Strengthening;10 reps Shoulder ABduction Weight (lbs): 1 Seated Protraction: Strengthening;Weights;10 reps Protraction Weight (lbs): 1 Horizontal ABduction: Strengthening;Weights;10 reps Horizontal ABduction Weight (lbs): 1 External Rotation: Strengthening;Weights;10 reps External Rotation Weight (lbs): 1 Internal Rotation: Strengthening;Weights;10 reps Internal Rotation Weight (lbs): 1 Flexion: Strengthening;Weights;10 reps Flexion Weight (lbs): 1 Abduction: Strengthening;Weights;10 reps ABduction Weight (lbs): 1 Standing Other Standing Exercises: Functional reaching task with cones from counter to first shelf then up to second shelf then back down to counter. ROM / Strengthening / Isometric Strengthening UBE (Upper Arm Bike): level 2 3' forward and 3' reverse Over Head Lace: 2  min, in sitting Proximal Shoulder Strengthening, Supine: 10 times with 1# Proximal Shoulder Strengthening, Seated: 10X with 1# Ball on Wall: 1' at 90 degrees flexion and 1' at 90 degrees abduction with weighted green ball      Manual Therapy Manual Therapy: Myofascial release Myofascial Release: Myofascial release and manual stretching to left upper arm, trapezius, and scapularis region to decrease fascial restrictions and increase joint mobility in a pain free zone  Occupational Therapy Assessment and Plan OT Assessment and Plan Clinical Impression Statement: A: Added functional reaching task with cones and increased time with lacing task. Patient tolerated well. Patient states that during his PET scan he will be required to lay down and hold arm up overhead. OT Plan: P: Add 1# weight to cone reaching task. Have patine supine with arm up over head for 1' to tolerate PET scan.   Goals Short Term Goals Time to Complete Short Term Goals: 4 weeks Short Term Goal 1: Patient will be educated on HEP.  Short Term Goal 2: Patient will report a pain level of 7/10 when completing daily tasks.  Short Term Goal 3: Patient will increase PROM to [facility] to increase ability to put shirt on/off with less difficulty. Short Term Goal 4: Patient will increase RUE shoulder strength to 3+/5 to increase ability to lift heavy items. Short Term Goal 5: Patient will decrease fascial restrictions in RUE from max to min-mod. Long Term Goals Time to Complete Long Term Goals: 8 weeks Long Term Goal 1: Patient will return to highest level of independence with all B/IADL and leisure activities.  Long Term Goal 1 Progress: Progressing toward goal Long Term Goal 2: Patient will report a pain level of 3/10 or less when during daily activities.  Long Term Goal 3: Patient will increase AROM to [facility] to increase ability to reach up into overhead cabinets with less difficulty.  Long Term Goal 3 Progress: Progressing toward  goal Long Term Goal 4: Patient will increase RUE shoulder strength to 4/5 to increase  ability to lift heavy items.  Long Term Goal 4 Progress: Progressing toward goal Long Term Goal 5: Patient will decrease fascial restrictions in RUE from min-mod to min. Long Term Goal 5 Progress: Other (comment)  Problem List Patient Active Problem List   Diagnosis Date Noted  . Muscle weakness (generalized) 01/28/2014  . Pain in joint, upper arm 01/28/2014  . Decreased range of motion of right shoulder 01/28/2014  . Closed fracture of right proximal humerus 12/23/2013  . Personal history of falling, presenting hazards to health 05/05/2012  . Bilateral leg weakness 05/05/2012  . Hodgkin's lymphoma 04/29/2012  . Chest pain on exertion 09/03/2011    End of Session Activity Tolerance: Patient tolerated treatment well General Behavior During Therapy: [facility] for tasks assessed/performed   Ailene Ravel, OTR/L,CBIS   03/21/2014, 3:20 PM

## 2014-03-23 ENCOUNTER — Ambulatory Visit (HOSPITAL_COMMUNITY)
Admission: RE | Admit: 2014-03-23 | Discharge: 2014-03-23 | Disposition: A | Discharge status: Home / Self Care | Payer: Medicare Other | Source: Ambulatory Visit

## 2014-03-23 NOTE — Progress Notes (Signed)
Occupational Therapy Treatment Patient Details  Name: Cody Wood MRN: [id number] Date of Birth: 09-18-1946  Today's Date: 03/23/2014 Time: 4967-5916 OT Time Calculation (min): 43 min Manual 3846-6599 (15') Therapeutic Exercises 1127-1155 (28')  Visit#: 22 of 24  Re-eval: 03/30/14    Authorization: Medicare  Authorization Time Period: before 29th visit  Authorization Visit#: 22 of 29  Subjective Symptoms/Limitations Symptoms: "Just a cerain way I move it it pops right in there" Pain Assessment Currently in Pain?: No/denies  Precautions/Restrictions     Exercise/Treatments Supine Protraction: PROM;Strengthening;10 reps Protraction Weight (lbs): 1 Horizontal ABduction: PROM;Strengthening;10 reps Horizontal ABduction Weight (lbs): 1 External Rotation: PROM;Strengthening;10 reps External Rotation Weight (lbs): 1 Internal Rotation: PROM;Strengthening;10 reps Internal Rotation Weight (lbs): 1 Flexion: PROM;Strengthening;10 reps Shoulder Flexion Weight (lbs): 1 ABduction: PROM;Strengthening;10 reps Shoulder ABduction Weight (lbs): 1 Other Supine Exercises: reaching into flexion and holding for 1 min Standing Protraction: Strengthening;10 reps;Weights Protraction Weight (lbs): 1 Horizontal ABduction: Strengthening;Weights;10 reps Horizontal ABduction Weight (lbs): 1 External Rotation: Strengthening;Weights;10 reps External Rotation Weight (lbs): 1 Internal Rotation: Strengthening;Weights;10 reps Internal Rotation Weight (lbs): 1 Flexion: Strengthening;Weights;10 reps Shoulder Flexion Weight (lbs): 1 ABduction: Strengthening;Weights;10 reps Shoulder ABduction Weight (lbs): 1 Other Standing Exercises: functional reaching - reaching into abduction to grasp 8 cones from OTR, and reacing into flexion to put them onto highest cabinet shelf. (removed and returned cones as well). With 1# wrist weight. ROM / Strengthening / Isometric Strengthening Over Head Lace: 2:30, in  sitting Proximal Shoulder Strengthening, Supine: 10 times with 1# Proximal Shoulder Strengthening, Seated: 10X with 1#   Manual Therapy Manual Therapy: Myofascial release Myofascial Release: Myofascial release and manual stretching to left upper arm, trapezius, and scapularis region to decrease fascial restrictions and increase joint mobility in a pain free zone.    Occupational Therapy Assessment and Plan OT Assessment and Plan Clinical Impression Statement: Pt had good tolerance of PROM, weighte exercises this session.  Added wrist wieght to functional reaching task, and pt able to complete.  Pt able to tolerate increased time with overhead lacing this session.  Added supine 1 min hold in flexion in preparation for PET scan - pt fatigued, but able to toelrate. OT Plan: Continue 1 min overhead reach in supine (for PET scan).  Add x-v and w arms (with no weight).   Goals Short Term Goals Short Term Goal 1: Patient will be educated on HEP.  Short Term Goal 1 Progress: Met Short Term Goal 2: Patient will report a pain level of 7/10 when completing daily tasks.  Short Term Goal 2 Progress: Met Short Term Goal 3: Patient will increase PROM to [facility] to increase ability to put shirt on/off with less difficulty. Short Term Goal 3 Progress: Met Short Term Goal 4: Patient will increase RUE shoulder strength to 3+/5 to increase ability to lift heavy items. Short Term Goal 4 Progress: Met Short Term Goal 5: Patient will decrease fascial restrictions in RUE from max to min-mod. Short Term Goal 5 Progress: Met Long Term Goals Long Term Goal 1: Patient will return to highest level of independence with all B/IADL and leisure activities.  Long Term Goal 1 Progress: Progressing toward goal Long Term Goal 2: Patient will report a pain level of 3/10 or less when during daily activities.  Long Term Goal 2 Progress: Met Long Term Goal 3: Patient will increase AROM to [facility] to increase ability to reach up into  overhead cabinets with less difficulty.  Long Term Goal 3 Progress: Progressing toward goal Long  Term Goal 4: Patient will increase RUE shoulder strength to 4/5 to increase ability to lift heavy items.  Long Term Goal 4 Progress: Progressing toward goal Long Term Goal 5: Patient will decrease fascial restrictions in RUE from min-mod to min. Long Term Goal 5 Progress: Progressing toward goal  Problem List Patient Active Problem List   Diagnosis Date Noted  . Muscle weakness (generalized) 01/28/2014  . Pain in joint, upper arm 01/28/2014  . Decreased range of motion of right shoulder 01/28/2014  . Closed fracture of right proximal humerus 12/23/2013  . Personal history of falling, presenting hazards to health 05/05/2012  . Bilateral leg weakness 05/05/2012  . Hodgkin's lymphoma 04/29/2012  . Chest pain on exertion 09/03/2011    End of Session Activity Tolerance: Patient tolerated treatment well General Behavior During Therapy: [facility] for tasks assessed/performed  GO    Bea Graff, MS, OTR/L [phone]  03/23/2014, 12:03 PM

## 2014-03-25 ENCOUNTER — Ambulatory Visit (HOSPITAL_COMMUNITY)
Admission: RE | Admit: 2014-03-25 | Discharge: 2014-03-25 | Disposition: A | Discharge status: Home / Self Care | Payer: Medicare Other | Source: Ambulatory Visit | Attending: Family Medicine | Admitting: Family Medicine

## 2014-03-25 NOTE — Progress Notes (Signed)
Occupational Therapy Treatment Patient Details  Name: Cody Wood MRN: [id number] Date of Birth: [birth year]  Today's Date: 03/25/2014 Time: 0865-7846 OT Time Calculation (min): 48 min Manual therapy 9629-5284 28' Therapeutic exercises 1420-1440 20'  Visit#: 23 of 24  Re-eval: 03/30/14    Authorization: Medicare  Authorization Time Period: before 29th visit  Authorization Visit#: 23 of 29  Subjective S:  I can turn the steering wheel hand over hand and reach on top of the refrigerator now! Limitations: begin AAROm week of 5/4 Pain Assessment Currently in Pain?: No/denies  Precautions/Restrictions   progress as tolerated  Exercise/Treatments Supine Protraction: PROM;Strengthening;10 reps Protraction Weight (lbs): 2 Horizontal ABduction: PROM;Strengthening;10 reps Horizontal ABduction Weight (lbs): 2 External Rotation: PROM;Strengthening;10 reps External Rotation Weight (lbs): 2 Internal Rotation: PROM;Strengthening;10 reps Internal Rotation Weight (lbs): 2 Flexion: PROM;Strengthening;10 reps Shoulder Flexion Weight (lbs): 2 ABduction: PROM;Strengthening;10 reps Shoulder ABduction Weight (lbs): 2 Other Supine Exercises: reaching into flexion and holding for 1 min with 2# Standing Protraction: Strengthening;10 reps;Weights Protraction Weight (lbs): 1 Horizontal ABduction: Strengthening;Weights;10 reps Horizontal ABduction Weight (lbs): 1 External Rotation: Strengthening;Weights;10 reps External Rotation Weight (lbs): 1 Internal Rotation: Strengthening;Weights;10 reps Internal Rotation Weight (lbs): 1 Flexion: Strengthening;Weights;10 reps Shoulder Flexion Weight (lbs): 1 ABduction: Strengthening;Weights;10 reps Shoulder ABduction Weight (lbs): 1 ROM / Strengthening / Isometric Strengthening UBE (Upper Arm Bike): level 2 3' forward and 3' reverse Cybex Press: 2 plate;15 reps Cybex Row: 2 plate;15 reps Over Head Lace: 2' with 1#  Wall Pushups: 15  reps Proximal Shoulder Strengthening, Supine: 10 times with 2#        Manual Therapy Manual Therapy: Myofascial release Myofascial Release: Myofascial release and manual stretching to right upper arm, trapezius, and scapularis region to decrease fascial restrictions and increase joint mobility in a pain free zone.   Occupational Therapy Assessment and Plan OT Assessment and Plan Clinical Impression Statement: A: increased to 2# with supine strengthening and added wall pushups for increased scapular stability. OT Plan: P:  Increase to 90 seconds for overhead reach and hold.   Goals Short Term Goals Short Term Goal 1: Patient will be educated on HEP.  Short Term Goal 2: Patient will report a pain level of 7/10 when completing daily tasks.  Short Term Goal 3: Patient will increase PROM to [facility] to increase ability to put shirt on/off with less difficulty. Short Term Goal 4: Patient will increase RUE shoulder strength to 3+/5 to increase ability to lift heavy items. Short Term Goal 5: Patient will decrease fascial restrictions in RUE from max to min-mod. Long Term Goals Long Term Goal 1: Patient will return to highest level of independence with all B/IADL and leisure activities.  Long Term Goal 2: Patient will report a pain level of 3/10 or less when during daily activities.  Long Term Goal 3: Patient will increase AROM to [facility] to increase ability to reach up into overhead cabinets with less difficulty.  Long Term Goal 4: Patient will increase RUE shoulder strength to 4/5 to increase ability to lift heavy items.  Long Term Goal 5: Patient will decrease fascial restrictions in RUE from min-mod to min.  Problem List Patient Active Problem List   Diagnosis Date Noted  . Muscle weakness (generalized) 01/28/2014  . Pain in joint, upper arm 01/28/2014  . Decreased range of motion of right shoulder 01/28/2014  . Closed fracture of right proximal humerus 12/23/2013  . Personal history of falling,  presenting hazards to health 05/05/2012  . Bilateral leg weakness 05/05/2012  .  Hodgkin's lymphoma 04/29/2012  . Chest pain on exertion 09/03/2011    End of Session Activity Tolerance: Patient tolerated treatment well General Behavior During Therapy: [facility] for tasks assessed/performed  O'Brien, OTR/L [phone]  03/25/2014, 2:38 PM

## 2014-03-28 ENCOUNTER — Ambulatory Visit (HOSPITAL_COMMUNITY)
Admission: RE | Admit: 2014-03-28 | Discharge: 2014-03-28 | Disposition: A | Discharge status: Home / Self Care | Payer: Medicare Other | Source: Ambulatory Visit | Attending: Family Medicine | Admitting: Family Medicine

## 2014-03-28 NOTE — Progress Notes (Signed)
Occupational Therapy Treatment Patient Details  Name: Cody Wood MRN: [id number] Date of Birth: 1946/07/24  Today's Date: 03/28/2014 Time: 2979-8921 OT Time Calculation (min): 42 min MFR 1348-1400 12' Therex 1400-1430 30'  Visit#: 24 of 24  Re-eval: 03/30/14    Authorization: Medicare  Authorization Time Period: before 29th visit  Authorization Visit#: 24 of 29  Subjective Symptoms/Limitations Symptoms: S: I still move it a certain way and it'll hurt me.  Pain Assessment Currently in Pain?: No/denies  Precautions/Restrictions  Precautions Precautions: None  Exercise/Treatments Supine Protraction: PROM;Strengthening;10 reps Protraction Weight (lbs): 2 Horizontal ABduction: PROM;Strengthening;10 reps Horizontal ABduction Weight (lbs): 2 External Rotation: PROM;Strengthening;10 reps External Rotation Weight (lbs): 2 Internal Rotation: PROM;Strengthening;10 reps Internal Rotation Weight (lbs): 2 Flexion: PROM;Strengthening;10 reps Shoulder Flexion Weight (lbs): 2 ABduction: PROM;Strengthening;10 reps Shoulder ABduction Weight (lbs): 2 Other Supine Exercises: reaching into flexion and holding for 1:30 with 2# Seated Protraction: Strengthening;Weights;10 reps Protraction Weight (lbs): 1 Horizontal ABduction: Strengthening;Weights;10 reps Horizontal ABduction Weight (lbs): 1 External Rotation: Strengthening;Weights;10 reps External Rotation Weight (lbs): 1 Internal Rotation: Strengthening;Weights;10 reps Internal Rotation Weight (lbs): 1 Flexion: Strengthening;Weights;10 reps Flexion Weight (lbs): 1 Abduction: Strengthening;Weights;10 reps ABduction Weight (lbs): 1 ROM / Strengthening / Isometric Strengthening UBE (Upper Arm Bike): level 2 3' forward and 3' reverse Over Head Lace: 2' with 1#  X to V Arms: 10X with 1# Proximal Shoulder Strengthening, Supine: 10 times with 2#  Proximal Shoulder Strengthening, Seated: 10X with 1# Ball on Wall: 1' at 90 degrees  flexion and 1' at 90 degrees abduction with weighted green ball      Manual Therapy Manual Therapy: Myofascial release Myofascial Release: Myofascial release and manual stretching to right upper arm, trapezius, and scapularis region to decrease fascial restrictions and increase joint mobility in a pain free zone.  Occupational Therapy Assessment and Plan OT Assessment and Plan Clinical Impression Statement: A: Increased flexion hold in supine to 1:30. patient tolerated well. Added X to V arms also.  OT Plan: P: Reassess   Goals Short Term Goals Short Term Goal 1: Patient will be educated on HEP.  Short Term Goal 2: Patient will report a pain level of 7/10 when completing daily tasks.  Short Term Goal 3: Patient will increase PROM to [facility] to increase ability to put shirt on/off with less difficulty. Short Term Goal 4: Patient will increase RUE shoulder strength to 3+/5 to increase ability to lift heavy items. Short Term Goal 5: Patient will decrease fascial restrictions in RUE from max to min-mod. Long Term Goals Long Term Goal 1: Patient will return to highest level of independence with all B/IADL and leisure activities.  Long Term Goal 1 Progress: Progressing toward goal Long Term Goal 2: Patient will report a pain level of 3/10 or less when during daily activities.  Long Term Goal 3: Patient will increase AROM to [facility] to increase ability to reach up into overhead cabinets with less difficulty.  Long Term Goal 3 Progress: Progressing toward goal Long Term Goal 4: Patient will increase RUE shoulder strength to 4/5 to increase ability to lift heavy items.  Long Term Goal 4 Progress: Progressing toward goal Long Term Goal 5: Patient will decrease fascial restrictions in RUE from min-mod to min. Long Term Goal 5 Progress: Progressing toward goal  Problem List Patient Active Problem List   Diagnosis Date Noted  . Muscle weakness (generalized) 01/28/2014  . Pain in joint, upper arm  01/28/2014  . Decreased range of motion of right shoulder 01/28/2014  .  Closed fracture of right proximal humerus 12/23/2013  . Personal history of falling, presenting hazards to health 05/05/2012  . Bilateral leg weakness 05/05/2012  . Hodgkin's lymphoma 04/29/2012  . Chest pain on exertion 09/03/2011    End of Session Activity Tolerance: Patient tolerated treatment well General Behavior During Therapy: [facility] for tasks assessed/performed   Ailene Ravel, OTR/L,CBIS   03/28/2014, 2:29 PM

## 2014-03-30 ENCOUNTER — Ambulatory Visit (HOSPITAL_COMMUNITY)
Admission: RE | Admit: 2014-03-30 | Discharge: 2014-03-30 | Disposition: A | Discharge status: Home / Self Care | Payer: Medicare Other | Source: Ambulatory Visit | Attending: Orthopedic Surgery | Admitting: Orthopedic Surgery

## 2014-03-30 DIAGNOSIS — M25529 Pain in unspecified elbow: Secondary | ICD-10-CM | POA: Insufficient documentation

## 2014-03-30 DIAGNOSIS — M6281 Muscle weakness (generalized): Secondary | ICD-10-CM | POA: Insufficient documentation

## 2014-03-30 DIAGNOSIS — IMO0001 Reserved for inherently not codable concepts without codable children: Secondary | ICD-10-CM | POA: Diagnosis present

## 2014-03-30 NOTE — Evaluation (Addendum)
Occupational Therapy Reassessment and Discharge  Patient Details  Name: Cody Wood MRN: [id number] Date of Birth: [birth year]  Today's Date: 03/30/2014 Time: 4097-3532 OT Time Calculation (min): 42 min MFR 9924-2683 14' Reassess 1319-1347 28'  Visit#: 25 of 24  Re-eval:    Assessment Diagnosis: s/p Right shoulder fx  Authorization: Medicare  Authorization Time Period: before 29th visit  Authorization Visit#: 25 of 29   Past Medical History:  Past Medical History  Diagnosis Date  . Diabetes mellitus   . Blockage of coronary artery of heart   . Hypertension   . hodgkins lymphoma dx'd 06/2008   Past Surgical History:  Past Surgical History  Procedure Laterality Date  . Knee surgery    . Mandible surgery    . Neck surgery      Subjective Symptoms/Limitations Symptoms: S: I slept on my arm I think and it made it a little sore today.  Special Tests: FOTO score: 67/100  Pain Assessment Currently in Pain?: No/denies  Precautions/Restrictions  Precautions Precautions: None   Assessment Additional Assessments RUE AROM (degrees) RUE Overall AROM Comments: assessed in supine and then seated.  ER/IR with shoulder adducted  Right Shoulder Flexion:  (150/106 (last progress note:150 supine) ) Right Shoulder ABduction:  (90/106 (last progress note: 115 supine)) Right Shoulder Internal Rotation:  (90/90 (last progress note: 82 supine)) Right Shoulder External Rotation:  (90/90 ( last progress note: 82 supine)) RUE Strength Right Shoulder Flexion: 5/5 (4-/5: last progress note) Right Shoulder ABduction: 5/5 (4-/5: last progress note) Right Shoulder Internal Rotation: 5/5 (4-/5 (last progress note: same)) Right Shoulder External Rotation: 5/5 (3+/5: last progress note) Palpation Palpation: trace fascial restrictions in right upper arm, trapezius, and scapularis region.      Exercise/Treatments    Manual Therapy Manual Therapy: Myofascial release Myofascial Release:  Myofascial release and manual stretching to right upper arm, trapezius, and scapularis region to decrease fascial restrictions and increase joint mobility in a pain free zone  Occupational Therapy Assessment and Plan OT Assessment and Plan Clinical Impression Statement: A: Reassessment and discharge completed this date. patient has met all therapy goals and was given a theraband HEP to complete at home independently. Patient was agreeable to discharge.  OT Plan: P: D/C from therapy.    Goals Short Term Goals Short Term Goal 1: Patient will be educated on HEP.  Short Term Goal 2: Patient will report a pain level of 7/10 when completing daily tasks.  Short Term Goal 3: Patient will increase PROM to [facility] to increase ability to put shirt on/off with less difficulty. Short Term Goal 4: Patient will increase RUE shoulder strength to 3+/5 to increase ability to lift heavy items. Short Term Goal 5: Patient will decrease fascial restrictions in RUE from max to min-mod. Long Term Goals Time to Complete Long Term Goals: 8 weeks Long Term Goal 1: Patient will return to highest level of independence with all B/IADL and leisure activities.  Long Term Goal 1 Progress: Met Long Term Goal 2: Patient will report a pain level of 3/10 or less when during daily activities.  Long Term Goal 3: Patient will increase AROM to [facility] to increase ability to reach up into overhead cabinets with less difficulty.  Long Term Goal 3 Progress: Met Long Term Goal 4: Patient will increase RUE shoulder strength to 4/5 to increase ability to lift heavy items.  Long Term Goal 4 Progress: Met Long Term Goal 5: Patient will decrease fascial restrictions in RUE from min-mod to  min. Long Term Goal 5 Progress: Met  Problem List Patient Active Problem List   Diagnosis Date Noted  . Muscle weakness (generalized) 01/28/2014  . Pain in joint, upper arm 01/28/2014  . Decreased range of motion of right shoulder 01/28/2014  . Closed  fracture of right proximal humerus 12/23/2013  . Personal history of falling, presenting hazards to health 05/05/2012  . Bilateral leg weakness 05/05/2012  . Hodgkin's lymphoma 04/29/2012  . Chest pain on exertion 09/03/2011    End of Session Activity Tolerance: Patient tolerated treatment well General Behavior During Therapy: [facility] for tasks assessed/performed OT Plan of Care OT Home Exercise Plan: Red theraband exercises OT Patient Instructions: handout (scanned) Consulted and Agree with Plan of Care: Patient  GO Functional Assessment Tool Used: FOTO score: 67/100 ( last progress note: 60/100) Functional Limitation: Carrying, moving and handling objects Carrying, Moving and Handling Objects Current Status ([id number]): At least 20 percent but less than 40 percent impaired, limited or restricted Carrying, Moving and Handling Objects Goal Status [phone]): At least 1 percent but less than 20 percent impaired, limited or restricted Carrying, Moving and Handling Objects Discharge Status [phone]): At least 20 percent but less than 40 percent impaired, limited or restricted  Ailene Ravel, OTR/L,CBIS   03/30/2014, 1:53 PM  Physician Documentation Your signature is required to indicate approval of the treatment plan as stated above.  Please sign and either send electronically or make a copy of this report for your files and return this physician signed original.  Please mark one 1.__approve of plan  2. ___approve of plan with the following conditions.   ______________________________                                                          _____________________ Physician Signature                                                                                                             Date SBNR

## 2014-04-04 ENCOUNTER — Ambulatory Visit (HOSPITAL_COMMUNITY): Payer: Medicare Other

## 2014-04-06 ENCOUNTER — Ambulatory Visit (HOSPITAL_COMMUNITY): Payer: Medicare Other

## 2014-04-08 ENCOUNTER — Ambulatory Visit (HOSPITAL_COMMUNITY): Payer: Medicare Other

## 2014-04-11 ENCOUNTER — Ambulatory Visit (HOSPITAL_COMMUNITY): Payer: Medicare Other

## 2014-04-13 ENCOUNTER — Ambulatory Visit (HOSPITAL_COMMUNITY): Payer: Medicare Other

## 2014-04-15 ENCOUNTER — Ambulatory Visit (HOSPITAL_COMMUNITY): Payer: Medicare Other

## 2014-04-18 ENCOUNTER — Ambulatory Visit (HOSPITAL_COMMUNITY): Payer: Medicare Other

## 2014-04-20 ENCOUNTER — Ambulatory Visit (HOSPITAL_COMMUNITY): Payer: Medicare Other

## 2014-04-22 ENCOUNTER — Ambulatory Visit (HOSPITAL_COMMUNITY): Payer: Medicare Other

## 2014-04-25 ENCOUNTER — Ambulatory Visit (HOSPITAL_COMMUNITY): Payer: Medicare Other

## 2014-04-25 ENCOUNTER — Telehealth: Payer: Self-pay | Admitting: Medical Oncology

## 2014-04-25 NOTE — Telephone Encounter (Signed)
Information given to PAM in precert.

## 2014-04-27 ENCOUNTER — Encounter (HOSPITAL_COMMUNITY): Payer: Self-pay

## 2014-04-27 ENCOUNTER — Other Ambulatory Visit (HOSPITAL_BASED_OUTPATIENT_CLINIC_OR_DEPARTMENT_OTHER): Payer: Medicare Other

## 2014-04-27 ENCOUNTER — Ambulatory Visit (HOSPITAL_COMMUNITY): Payer: Medicare Other

## 2014-04-27 ENCOUNTER — Ambulatory Visit (HOSPITAL_COMMUNITY)
Admission: RE | Admit: 2014-04-27 | Discharge: 2014-04-27 | Disposition: A | Discharge status: Home / Self Care | Payer: Medicare Other | Source: Ambulatory Visit | Attending: Internal Medicine | Admitting: Internal Medicine

## 2014-04-27 DIAGNOSIS — C8589 Other specified types of non-Hodgkin lymphoma, extranodal and solid organ sites: Secondary | ICD-10-CM | POA: Insufficient documentation

## 2014-04-27 DIAGNOSIS — I7090 Unspecified atherosclerosis: Secondary | ICD-10-CM | POA: Insufficient documentation

## 2014-04-27 DIAGNOSIS — C8190 Hodgkin lymphoma, unspecified, unspecified site: Secondary | ICD-10-CM

## 2014-04-27 DIAGNOSIS — J984 Other disorders of lung: Secondary | ICD-10-CM | POA: Diagnosis not present

## 2014-04-27 DIAGNOSIS — M542 Cervicalgia: Secondary | ICD-10-CM | POA: Diagnosis present

## 2014-04-27 DIAGNOSIS — R110 Nausea: Secondary | ICD-10-CM | POA: Insufficient documentation

## 2014-04-27 DIAGNOSIS — I2510 Atherosclerotic heart disease of native coronary artery without angina pectoris: Secondary | ICD-10-CM | POA: Diagnosis not present

## 2014-04-27 DIAGNOSIS — R197 Diarrhea, unspecified: Secondary | ICD-10-CM | POA: Diagnosis not present

## 2014-04-27 DIAGNOSIS — I700 Atherosclerosis of aorta: Secondary | ICD-10-CM | POA: Insufficient documentation

## 2014-04-27 DIAGNOSIS — R935 Abnormal findings on diagnostic imaging of other abdominal regions, including retroperitoneum: Secondary | ICD-10-CM | POA: Diagnosis not present

## 2014-04-27 DIAGNOSIS — K8689 Other specified diseases of pancreas: Secondary | ICD-10-CM | POA: Insufficient documentation

## 2014-04-27 LAB — COMPREHENSIVE METABOLIC PANEL (CC13)
ALT: 6 U/L (ref 0–55)
AST: 13 U/L (ref 5–34)
Albumin: 4.2 g/dL (ref 3.5–5.0)
Alkaline Phosphatase: 86 U/L (ref 40–150)
Anion Gap: 8 mEq/L (ref 3–11)
BUN: 18.7 mg/dL (ref 7.0–26.0)
CO2: 31 mEq/L — ABNORMAL HIGH (ref 22–29)
Calcium: 10.1 mg/dL (ref 8.4–10.4)
Chloride: 99 mEq/L (ref 98–109)
Creatinine: 1.1 mg/dL (ref 0.7–1.3)
Glucose: 209 mg/dl — ABNORMAL HIGH (ref 70–140)
Potassium: 4.4 mEq/L (ref 3.5–5.1)
Sodium: 138 mEq/L (ref 136–145)
Total Bilirubin: 0.93 mg/dL (ref 0.20–1.20)
Total Protein: 7.1 g/dL (ref 6.4–8.3)

## 2014-04-27 LAB — CBC WITH DIFFERENTIAL/PLATELET
BASO%: 0.4 % (ref 0.0–2.0)
Basophils Absolute: 0 10*3/uL (ref 0.0–0.1)
EOS%: 2.7 % (ref 0.0–7.0)
Eosinophils Absolute: 0.1 10*3/uL (ref 0.0–0.5)
HCT: 41.8 % (ref 38.4–49.9)
HGB: 13.4 g/dL (ref 13.0–17.1)
LYMPH%: 20.9 % (ref 14.0–49.0)
MCH: 25.7 pg — ABNORMAL LOW (ref 27.2–33.4)
MCHC: 32 g/dL (ref 32.0–36.0)
MCV: 80.1 fL (ref 79.3–98.0)
MONO#: 0.3 10*3/uL (ref 0.1–0.9)
MONO%: 10.4 % (ref 0.0–14.0)
NEUT#: 2.2 10*3/uL (ref 1.5–6.5)
NEUT%: 65.6 % (ref 39.0–75.0)
Platelets: 152 10*3/uL (ref 140–400)
RBC: 5.22 10*6/uL (ref 4.20–5.82)
RDW: 14.7 % — ABNORMAL HIGH (ref 11.0–14.6)
WBC: 3.3 10*3/uL — ABNORMAL LOW (ref 4.0–10.3)
lymph#: 0.7 10*3/uL — ABNORMAL LOW (ref 0.9–3.3)

## 2014-04-27 LAB — LACTATE DEHYDROGENASE (CC13): LDH: 113 U/L — ABNORMAL LOW (ref 125–245)

## 2014-04-27 MED ORDER — IOHEXOL 300 MG/ML  SOLN
100.0000 mL | Freq: Once | INTRAMUSCULAR | Status: AC | PRN
  Administered 2014-04-27: 100 mL via INTRAVENOUS

## 2014-04-28 ENCOUNTER — Ambulatory Visit (INDEPENDENT_AMBULATORY_CARE_PROVIDER_SITE_OTHER): Payer: Medicare Other | Admitting: Orthopedic Surgery

## 2014-04-28 VITALS — BP 148/90 | Ht 68.0 in | Wt 230.0 lb

## 2014-04-28 DIAGNOSIS — S4291XA Fracture of right shoulder girdle, part unspecified, initial encounter for closed fracture: Secondary | ICD-10-CM

## 2014-04-28 DIAGNOSIS — S42309D Unspecified fracture of shaft of humerus, unspecified arm, subsequent encounter for fracture with routine healing: Secondary | ICD-10-CM

## 2014-04-28 DIAGNOSIS — S42201D Unspecified fracture of upper end of right humerus, subsequent encounter for fracture with routine healing: Secondary | ICD-10-CM

## 2014-04-28 DIAGNOSIS — S42209A Unspecified fracture of upper end of unspecified humerus, initial encounter for closed fracture: Secondary | ICD-10-CM

## 2014-04-28 NOTE — Patient Instructions (Signed)
Home exercises  

## 2014-04-28 NOTE — Progress Notes (Signed)
Patient ID: Cody Wood, male   DOB: [birth year], 68 y.o.   MRN: [id number]   Chief Complaint  Patient presents with  . Follow-up    6 week recheck right shoulder fracture DOI 12/21/13    BP 148/90  Ht 5\' 8"  (1.727 m)  Wt 230 lb (104.327 kg)  BMI 34.98 kg/m2  Encounter Diagnoses  Name Primary?  . Closed fracture of right proximal humerus, with routine healing, subsequent encounter Yes  . Shoulder fracture, right, closed, initial encounter     Proximal humerus fracture treated with closed treatment physical therapy patient comes in with limited for elevation. He says his been working on strengthening exercises but not stretching  I showed him how to do a cane assisted flexion exercises told him back in 3 months after working on it some more.

## 2014-04-29 ENCOUNTER — Ambulatory Visit (HOSPITAL_COMMUNITY): Payer: Medicare Other

## 2014-05-02 ENCOUNTER — Encounter: Payer: Self-pay | Admitting: Internal Medicine

## 2014-05-02 ENCOUNTER — Telehealth: Payer: Self-pay | Admitting: Internal Medicine

## 2014-05-02 ENCOUNTER — Ambulatory Visit (HOSPITAL_BASED_OUTPATIENT_CLINIC_OR_DEPARTMENT_OTHER): Payer: Medicare Other | Admitting: Internal Medicine

## 2014-05-02 VITALS — BP 145/50 | HR 64 | Temp 98.5°F | Resp 18 | Ht 68.0 in | Wt 221.1 lb

## 2014-05-02 DIAGNOSIS — C8190 Hodgkin lymphoma, unspecified, unspecified site: Secondary | ICD-10-CM

## 2014-05-02 DIAGNOSIS — Z87898 Personal history of other specified conditions: Secondary | ICD-10-CM

## 2014-05-02 NOTE — Telephone Encounter (Signed)
gv pt appt schedule for jan 2016. central will call re ct. pt aware.

## 2014-05-02 NOTE — Progress Notes (Signed)
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   PRIOR THERAPY:  1. Status post 6 cycles of systemic chemotherapy with ABVD. Last dose was given January 12, 2009. 2. Status post palliative radiotherapy to the recurrent disease in the neck under the care of Dr. Tammi Klippel, completed January 21, 2011.  CURRENT THERAPY: Observation.   INTERVAL HISTORY: Cody Wood 68 y.o. male returns to the clinic today for six-month followup visit. The patient is feeling fine today with no specific complaints. He did fall few months ago and hurt his right shoulder. He was seen by orthopedic surgery and underwent physical therapy.  He lost a few pounds recently. The patient denied having any significant chest pain but continues to have shortness of breath with exertion. He has no cough or hemoptysis. He denied having any significant peripheral lymphadenopathy. He has no fever or chills, no nausea or vomiting. The patient has repeat CT scan of the neck, chest, abdomen and pelvis performed recently and he is here for evaluation and discussion of his scan results.   MEDICAL HISTORY: Past Medical History  Diagnosis Date  . Blockage of coronary artery of heart   . Hypertension   . hodgkins lymphoma dx'd 06/2008  . Diabetes mellitus     metformin    ALLERGIES:  has No Known Allergies.  MEDICATIONS:  Current Outpatient Prescriptions  Medication Sig Dispense Refill  . aspirin 325 MG tablet Take 325 mg by mouth daily.      . carvedilol (COREG) 3.125 MG tablet Take 3.125 mg by mouth 2 (two) times daily with a meal.        . insulin glargine (LANTUS) 100 UNIT/ML injection Inject 21 Units into the skin at bedtime. If levels are below 120, take only 20 units      . losartan (COZAAR) 50 MG  tablet Take 50 mg by mouth daily.      Marland Kitchen lovastatin (MEVACOR) 20 MG tablet Take 20 mg by mouth every morning.       . metFORMIN (GLUCOPHAGE) 500 MG tablet Take 500 mg by mouth 2 (two) times daily with a meal.      . HYDROcodone-acetaminophen (NORCO/VICODIN) 5-325 MG per tablet Take 1 tablet by mouth every 6 (six) hours as needed.  120 tablet  0   No current facility-administered medications for this visit.    SURGICAL HISTORY:  Past Surgical History  Procedure Laterality Date  . Knee surgery    . Mandible surgery    . Neck surgery      REVIEW OF SYSTEMS:  A comprehensive review of systems was negative except for: Musculoskeletal: positive for Right shoulder pain   PHYSICAL EXAMINATION: General appearance: alert, cooperative and no distress Head: Normocephalic, without obvious abnormality, atraumatic Neck: no adenopathy Lymph nodes: Cervical, supraclavicular, and axillary nodes normal. Resp: clear to auscultation bilaterally Cardio: regular rate and rhythm, S1, S2 normal, no murmur, click, rub or gallop GI: soft, non-tender; bowel sounds normal; no masses,  no organomegaly Extremities: extremities normal, atraumatic, no cyanosis or edema  ECOG PERFORMANCE STATUS: 1 - Symptomatic but completely ambulatory  Blood pressure 145/50, pulse 64, temperature 98.5 F (36.9 C), temperature source Oral, resp. rate 18, height 5\' 8"  (1.727 m), weight 221 lb 1.6 oz (100.29 kg), SpO2 99.00%.  LABORATORY DATA: Lab Results  Component Value Date   WBC 3.3* 04/27/2014   HGB 13.4 04/27/2014   HCT 41.8 04/27/2014   MCV 80.1 04/27/2014   PLT 152 04/27/2014      Chemistry      Component Value Date/Time   NA 138 04/27/2014 1009   NA 140 12/21/2013 1731   NA 143 04/27/2012 1029   K 4.4 04/27/2014 1009   K 4.2 12/21/2013 1731   K 4.4 04/27/2012 1029   CL 100 12/21/2013 1731   CL 101 10/27/2012 1045   CL 98 04/27/2012 1029   CO2 31* 04/27/2014 1009   CO2 26 12/21/2013 1731   CO2 28 04/27/2012 1029   BUN  18.7 04/27/2014 1009   BUN 21 12/21/2013 1731   BUN 18 04/27/2012 1029   CREATININE 1.1 04/27/2014 1009   CREATININE 0.86 12/21/2013 1731   CREATININE 0.9 04/27/2012 1029      Component Value Date/Time   CALCIUM 10.1 04/27/2014 1009   CALCIUM 10.0 12/21/2013 1731   CALCIUM 9.7 04/27/2012 1029   ALKPHOS 86 04/27/2014 1009   ALKPHOS 78 12/21/2013 1731   ALKPHOS 78 04/27/2012 1029   AST 13 04/27/2014 1009   AST 13 12/21/2013 1731   AST 19 04/27/2012 1029   ALT 6 04/27/2014 1009   ALT 10 12/21/2013 1731   ALT 21 04/27/2012 1029   BILITOT 0.93 04/27/2014 1009   BILITOT 0.4 12/21/2013 1731   BILITOT 0.70 04/27/2012 1029       RADIOGRAPHIC STUDIES: Ct Chest W Contrast  04/27/2014   CLINICAL DATA:  History of lymphoma diagnosed in 2009 status post chemotherapy and radiation therapy now complete. Right-sided neck pain. Nausea. Diarrhea.  EXAM: CT OF THE NECK WITH CONTRAST  CT OF THE CHEST WITH CONTRAST  CT OF THE ABDOMEN AND PELVIS WITH CONTRAST  TECHNIQUE: Multidetector CT imaging of the neck was performed with intravenous contrast.; Multidetector CT imaging of the abdomen and pelvis was performed following the standard protocol during bolus administration of intravenous contrast.; Multidetector CT imaging of the chest was performed following the standard protocol during bolus administration of intravenous contrast.  CONTRAST:  163mL OMNIPAQUE IOHEXOL 300 MG/ML  SOLN  COMPARISON:  None.  FINDINGS:   CT NECK FINDINGS  No soft tissue mass or cervical lymphadenopathy. Postoperative changes of ACDF from C3 through C5 are noted. Complete bony fusion at these levels. Visualized portions of the intracranial contents are unremarkable.    CT CHEST FINDINGS  Mediastinum: Heart size is normal. There is no significant pericardial fluid, thickening or pericardial calcification. There is atherosclerosis of the thoracic aorta, the great vessels of the mediastinum and the coronary arteries, including calcified atherosclerotic  plaque in the left main, left anterior descending, left circumflex and right coronary arteries. No pathologically enlarged mediastinal or hilar lymph nodes. Esophagus is unremarkable in appearance.  Lungs/Pleura: No suspicious appearing pulmonary nodules or masses. No acute consolidative airspace disease. No pleural effusions. Patchy areas of mild scarring throughout the lung bases bilaterally.  Musculoskeletal: There are no aggressive appearing lytic or blastic lesions noted in the visualized portions of the skeleton.    CT ABDOMEN AND PELVIS FINDINGS  Abdomen/Pelvis: The appearance of the liver, gallbladder, spleen, bilateral adrenal glands and bilateral kidneys is unremarkable. 6 mm low attenuation lesion in the body of the pancreas (which is mildly atrophic) is unchanged, presumably a tiny pancreatic pseudocyst. No lymphadenopathy identified in the abdomen or pelvis. Several prominent but nonenlarged upper retroperitoneal lymph nodes are  similar to numerous prior examinations. Atherosclerosis throughout the abdominal and pelvic vasculature, without evidence of aneurysm or dissection. No significant volume of ascites. No pneumoperitoneum. No pathologic distention of small bowel. Prostate gland and urinary bladder are unremarkable in appearance.  Musculoskeletal: Image number 104 of series 2 demonstrates a 10 mm soft tissue attenuation nodule in the subcutaneous fat of the lower anterior abdominal wall which has been slowly enlarging over numerous prior examinations, favored to represent a sebaceous cyst. There are no aggressive appearing lytic or blastic lesions noted in the visualized portions of the skeleton.    IMPRESSION: 1. No findings to suggest recurrence of lymphoma in the neck, chest, abdomen or pelvis. 2. Atherosclerosis, including left main and 3 vessel coronary artery disease. Assessment for potential risk factor modification, dietary therapy or pharmacologic therapy may be warranted, if  clinically indicated. 3. Additional incidental findings, as above.   Electronically Signed   By: Vinnie Langton M.D.   On: 04/27/2014 13:44   ASSESSMENT AND PLAN: This is a very pleasant 68 years old white male with history of recurrent Hodgkin's lymphoma last treatment with with palliative radiation in April 2012 and the patient has been observation since that time with no evidence for disease recurrence. I discussed the scan results with the patient today.  I recommended for him to continue on observation with repeat CT scan of the neck, chest, abdomen and pelvis in 6 months. He was advised to call immediately if he has any concerning symptoms in the interval.  The patient voices understanding of current disease status and treatment options and is in agreement with the current care plan.  All questions were answered. The patient knows to call the clinic with any problems, questions or concerns. We can certainly see the patient much sooner if necessary.  Disclaimer: This note was dictated with voice recognition software. Similar sounding words can inadvertently be transcribed and may not be corrected upon review.

## 2014-08-02 ENCOUNTER — Encounter: Payer: Self-pay | Admitting: Orthopedic Surgery

## 2014-08-02 ENCOUNTER — Ambulatory Visit: Payer: Medicare Other | Admitting: Orthopedic Surgery

## 2014-09-07 ENCOUNTER — Encounter (HOSPITAL_COMMUNITY): Payer: Self-pay | Admitting: Cardiology

## 2014-10-12 ENCOUNTER — Other Ambulatory Visit: Payer: Self-pay | Admitting: Obstetrics and Gynecology

## 2014-10-15 LAB — WOUND CULTURE: Gram Stain: NONE SEEN

## 2014-10-24 ENCOUNTER — Encounter (HOSPITAL_COMMUNITY): Payer: Self-pay

## 2014-10-24 ENCOUNTER — Ambulatory Visit (HOSPITAL_COMMUNITY)
Admission: RE | Admit: 2014-10-24 | Discharge: 2014-10-24 | Disposition: A | Discharge status: Home / Self Care | Payer: Medicare Other | Source: Ambulatory Visit | Attending: Internal Medicine | Admitting: Internal Medicine

## 2014-10-24 ENCOUNTER — Other Ambulatory Visit (HOSPITAL_BASED_OUTPATIENT_CLINIC_OR_DEPARTMENT_OTHER): Payer: Medicare Other

## 2014-10-24 DIAGNOSIS — I2510 Atherosclerotic heart disease of native coronary artery without angina pectoris: Secondary | ICD-10-CM | POA: Insufficient documentation

## 2014-10-24 DIAGNOSIS — Z923 Personal history of irradiation: Secondary | ICD-10-CM | POA: Insufficient documentation

## 2014-10-24 DIAGNOSIS — R079 Chest pain, unspecified: Secondary | ICD-10-CM | POA: Insufficient documentation

## 2014-10-24 DIAGNOSIS — Z9221 Personal history of antineoplastic chemotherapy: Secondary | ICD-10-CM | POA: Insufficient documentation

## 2014-10-24 DIAGNOSIS — C8190 Hodgkin lymphoma, unspecified, unspecified site: Secondary | ICD-10-CM | POA: Insufficient documentation

## 2014-10-24 DIAGNOSIS — R197 Diarrhea, unspecified: Secondary | ICD-10-CM | POA: Diagnosis not present

## 2014-10-24 DIAGNOSIS — M25511 Pain in right shoulder: Secondary | ICD-10-CM | POA: Diagnosis not present

## 2014-10-24 DIAGNOSIS — Z08 Encounter for follow-up examination after completed treatment for malignant neoplasm: Secondary | ICD-10-CM | POA: Diagnosis present

## 2014-10-24 DIAGNOSIS — Z8579 Personal history of other malignant neoplasms of lymphoid, hematopoietic and related tissues: Secondary | ICD-10-CM

## 2014-10-24 LAB — COMPREHENSIVE METABOLIC PANEL (CC13)
ALT: 15 U/L (ref 0–55)
AST: 13 U/L (ref 5–34)
Albumin: 3.8 g/dL (ref 3.5–5.0)
Alkaline Phosphatase: 83 U/L (ref 40–150)
Anion Gap: 6 mEq/L (ref 3–11)
BUN: 14.2 mg/dL (ref 7.0–26.0)
CO2: 30 mEq/L — ABNORMAL HIGH (ref 22–29)
Calcium: 9.4 mg/dL (ref 8.4–10.4)
Chloride: 99 mEq/L (ref 98–109)
Creatinine: 1.2 mg/dL (ref 0.7–1.3)
EGFR: 59 mL/min/{1.73_m2} — ABNORMAL LOW (ref >90)
Glucose: 304 mg/dl — ABNORMAL HIGH (ref 70–140)
Potassium: 5.1 mEq/L (ref 3.5–5.1)
Sodium: 136 mEq/L (ref 136–145)
Total Bilirubin: 0.51 mg/dL (ref 0.20–1.20)
Total Protein: 6.4 g/dL (ref 6.4–8.3)

## 2014-10-24 LAB — LACTATE DEHYDROGENASE (CC13): LDH: 123 U/L — ABNORMAL LOW (ref 125–245)

## 2014-10-24 MED ORDER — IOHEXOL 300 MG/ML  SOLN
100.0000 mL | Freq: Once | INTRAMUSCULAR | Status: AC | PRN
  Administered 2014-10-24: 100 mL via INTRAVENOUS

## 2014-10-31 ENCOUNTER — Telehealth: Payer: Self-pay | Admitting: Internal Medicine

## 2014-10-31 ENCOUNTER — Encounter: Payer: Self-pay | Admitting: Internal Medicine

## 2014-10-31 ENCOUNTER — Ambulatory Visit (HOSPITAL_BASED_OUTPATIENT_CLINIC_OR_DEPARTMENT_OTHER): Payer: Medicare Other | Admitting: Internal Medicine

## 2014-10-31 VITALS — BP 147/56 | HR 62 | Temp 98.7°F | Resp 18 | Ht 68.0 in | Wt 211.1 lb

## 2014-10-31 DIAGNOSIS — Z8572 Personal history of non-Hodgkin lymphomas: Secondary | ICD-10-CM

## 2014-10-31 DIAGNOSIS — C8190 Hodgkin lymphoma, unspecified, unspecified site: Secondary | ICD-10-CM

## 2014-10-31 NOTE — Telephone Encounter (Signed)
gv and printed appt sched adna vs for pt for Jan 2017

## 2014-10-31 NOTE — Progress Notes (Signed)
[facility] Telephone:([phone]   Fax:(336) [phone]  OFFICE PROGRESS NOTE  [name], MD [address] [place] [address]  PRINCIPAL DIAGNOSIS: Recurrent Hodgkin's lymphoma initially diagnosed as stage III in October 2009.   PRIOR THERAPY:  1. Status post 6 cycles of systemic chemotherapy with ABVD. Last dose was given January 12, 2009. 2. Status post palliative radiotherapy to the recurrent disease in the neck under the care of Dr. Tammi Klippel, completed January 21, 2011.  CURRENT THERAPY: Observation.   INTERVAL HISTORY: Cody Wood 69 y.o. male returns to the clinic today for six-month followup visit. The patient has been observation for the last 6 years. He is feeling fine today with no specific complaints. The patient denied having any significant chest pain but continues to have shortness of breath with exertion. He has no cough or hemoptysis. He denied having any significant peripheral lymphadenopathy. He has no fever or chills, no nausea or vomiting. The patient has repeat CT scan of the neck, chest, abdomen and pelvis performed recently and he is here for evaluation and discussion of his scan results.   MEDICAL HISTORY: Past Medical History  Diagnosis Date  . Blockage of coronary artery of heart   . Hypertension   . hodgkins lymphoma dx'd 06/2008  . Diabetes mellitus     metformin    ALLERGIES:  has No Known Allergies.  MEDICATIONS:  Current Outpatient Prescriptions  Medication Sig Dispense Refill  . aspirin 325 MG tablet Take 325 mg by mouth daily.    . carvedilol (COREG) 3.125 MG tablet Take 3.125 mg by mouth 2 (two) times daily with a meal.      . HYDROcodone-acetaminophen (NORCO) 7.5-325 MG per tablet Take 1 tablet by mouth every 6 (six) hours as needed.  0  . insulin glargine (LANTUS) 100 UNIT/ML injection Inject 21 Units into the skin at bedtime. If levels are below 120, take only 20 units    . LANTUS SOLOSTAR 100  UNIT/ML Solostar Pen   0  . losartan (COZAAR) 50 MG tablet Take 50 mg by mouth daily.    Marland Kitchen lovastatin (MEVACOR) 20 MG tablet Take 20 mg by mouth every morning.     . metFORMIN (GLUCOPHAGE) 500 MG tablet Take 500 mg by mouth 2 (two) times daily with a meal.    . sulfamethoxazole-trimethoprim (BACTRIM DS,SEPTRA DS) 800-160 MG per tablet   0  . cephALEXin (KEFLEX) 500 MG capsule   0   No current facility-administered medications for this visit.    SURGICAL HISTORY:  Past Surgical History  Procedure Laterality Date  . Knee surgery    . Mandible surgery    . Neck surgery    . Left heart catheterization with coronary angiogram N/A 09/03/2011    Procedure: LEFT HEART CATHETERIZATION WITH CORONARY ANGIOGRAM;  Surgeon: Laverda Page, MD;  Location: [facility] CATH LAB;  Service: Cardiovascular;  Laterality: N/A;    REVIEW OF SYSTEMS:  A comprehensive review of systems was negative.   PHYSICAL EXAMINATION: General appearance: alert, cooperative and no distress Head: Normocephalic, without obvious abnormality, atraumatic Neck: no adenopathy Lymph nodes: Cervical, supraclavicular, and axillary nodes normal. Resp: clear to auscultation bilaterally Cardio: regular rate and rhythm, S1, S2 normal, no murmur, click, rub or gallop GI: soft, non-tender; bowel sounds normal; no masses,  no organomegaly Extremities: extremities normal, atraumatic, no cyanosis or edema  ECOG PERFORMANCE STATUS: 1 - Symptomatic but completely ambulatory  Blood pressure  147/56, pulse 62, temperature 98.7 F (37.1 C), temperature source Oral, resp. rate 18, height 5\' 8"  (1.727 m), weight 211 lb 1.6 oz (95.754 kg), SpO2 99 %.  LABORATORY DATA: Lab Results  Component Value Date   WBC 3.3* 04/27/2014   HGB 13.4 04/27/2014   HCT 41.8 04/27/2014   MCV 80.1 04/27/2014   PLT 152 04/27/2014      Chemistry      Component Value Date/Time   NA 136 10/24/2014 1015   NA 140 12/21/2013 1731   NA 143 04/27/2012 1029   K 5.1  10/24/2014 1015   K 4.2 12/21/2013 1731   K 4.4 04/27/2012 1029   CL 100 12/21/2013 1731   CL 101 10/27/2012 1045   CL 98 04/27/2012 1029   CO2 30* 10/24/2014 1015   CO2 26 12/21/2013 1731   CO2 28 04/27/2012 1029   BUN 14.2 10/24/2014 1015   BUN 21 12/21/2013 1731   BUN 18 04/27/2012 1029   CREATININE 1.2 10/24/2014 1015   CREATININE 0.86 12/21/2013 1731   CREATININE 0.9 04/27/2012 1029      Component Value Date/Time   CALCIUM 9.4 10/24/2014 1015   CALCIUM 10.0 12/21/2013 1731   CALCIUM 9.7 04/27/2012 1029   ALKPHOS 83 10/24/2014 1015   ALKPHOS 78 12/21/2013 1731   ALKPHOS 78 04/27/2012 1029   AST 13 10/24/2014 1015   AST 13 12/21/2013 1731   AST 19 04/27/2012 1029   ALT 15 10/24/2014 1015   ALT 10 12/21/2013 1731   ALT 21 04/27/2012 1029   BILITOT 0.51 10/24/2014 1015   BILITOT 0.4 12/21/2013 1731   BILITOT 0.70 04/27/2012 1029       RADIOGRAPHIC STUDIES: Ct Soft Tissue Neck W Contrast  10/24/2014   CLINICAL DATA:  Hodgkin's lymphoma. Staging. Completed chemotherapy and radiation.  EXAM: CT NECK WITH CONTRAST  TECHNIQUE: Multidetector CT imaging of the neck was performed using the standard protocol following the bolus administration of intravenous contrast.  CONTRAST:  157mL OMNIPAQUE IOHEXOL 300 MG/ML  SOLN  COMPARISON:  04/27/2014  FINDINGS: Pharynx and larynx: The nasopharynx, oropharynx, oral cavity, and larynx are unremarkable. The patient is edentulous.  Salivary glands: Submandibular and parotid glands are unremarkable.  Thyroid: Unremarkable.  Lymph nodes: No enlarged lymph nodes identified in the neck.  Vascular: Major vascular structures of the neck are patent, with prominent atherosclerotic calcification at both carotid bifurcations, similar to the prior study and likely resulting in mild to moderate proximal right internal carotid artery stenosis.  Limited intracranial: Visualized portions of the brain and orbits are unremarkable.  Mastoids and visualized paranasal  sinuses: Trace right maxillary sinus mucosal thickening is noted. Mastoid air cells are clear.  Skeleton: Sequelae of prior C3-C6 ACDF are again identified. There is also solid osseous fusion at C6-7.  Upper chest: Evaluated on the concurrent dedicated chest CT.  IMPRESSION: No evidence of recurrent lymphoma in the neck.   Electronically Signed   By: Logan Bores   On: 10/24/2014 11:51   Ct Chest W Contrast  10/24/2014   CLINICAL DATA:  Lymphoma. Chemotherapy and radiation therapy complete. Right-sided chest pain and right shoulder pain. Diarrhea.  EXAM: CT CHEST, ABDOMEN, AND PELVIS WITH CONTRAST  TECHNIQUE: Multidetector CT imaging of the chest, abdomen and pelvis was performed following the standard protocol during bolus administration of intravenous contrast.  CONTRAST:  130mL OMNIPAQUE IOHEXOL 300 MG/ML  SOLN  COMPARISON:  04/27/2014.  FINDINGS: CT CHEST FINDINGS  CT CHEST FINDINGS  Mediastinum/Nodes: No  pathologically enlarged mediastinal, hilar or axillary lymph nodes. Atherosclerotic calcification of the arterial vasculature, including three-vessel involvement of the coronary arteries. Heart size normal. No pericardial effusion.  Lungs/Pleura: Mild scarring in the lower lobes. Lungs are otherwise clear. No pleural fluid. Airway is unremarkable.  Musculoskeletal: There is asymmetric atrophy of the right shoulder and right chest wall musculature. No worrisome lytic or sclerotic lesions. Degenerative changes are seen in the spine.  CT ABDOMEN AND PELVIS FINDINGS  Hepatobiliary: Liver and gallbladder are unremarkable. No biliary ductal dilatation.  Pancreas: Negative.  Spleen: Negative.  Adrenals/Urinary Tract: Adrenal glands and kidneys are unremarkable. Ureters are decompressed. Bladder is unremarkable.  Stomach/Bowel: Stomach and small bowel are unremarkable. Appendix is not readily visualized. A fair amount of stool is seen in the colon.  Vascular/Lymphatic: Atherosclerotic calcification of the arterial  vasculature without abdominal aortic aneurysm. Scattered lymph nodes are not enlarged by CT size criteria.  Reproductive: Prostate is normal in size.  Other: No free fluid. Mesenteries and peritoneum are unremarkable. Tiny periumbilical hernia contains fat.  Musculoskeletal: No worrisome lytic or sclerotic lesions. Degenerative changes are seen in the spine.  IMPRESSION: 1. No evidence of recurrent lymphoma. 2. Asymmetric atrophy of the right shoulder and right chest wall musculature. No additional findings to explain the patient's pain. 3. Three-vessel coronary artery calcification. 4. Fair amount of stool in the colon is indicative of constipation.   Electronically Signed   By: Lorin Picket M.D.   On: 10/24/2014 11:59   Ct Abdomen Pelvis W Contrast  10/24/2014   CLINICAL DATA:  Lymphoma. Chemotherapy and radiation therapy complete. Right-sided chest pain and right shoulder pain. Diarrhea.  EXAM: CT CHEST, ABDOMEN, AND PELVIS WITH CONTRAST  TECHNIQUE: Multidetector CT imaging of the chest, abdomen and pelvis was performed following the standard protocol during bolus administration of intravenous contrast.  CONTRAST:  139mL OMNIPAQUE IOHEXOL 300 MG/ML  SOLN  COMPARISON:  04/27/2014.  FINDINGS: CT CHEST FINDINGS  CT CHEST FINDINGS  Mediastinum/Nodes: No pathologically enlarged mediastinal, hilar or axillary lymph nodes. Atherosclerotic calcification of the arterial vasculature, including three-vessel involvement of the coronary arteries. Heart size normal. No pericardial effusion.  Lungs/Pleura: Mild scarring in the lower lobes. Lungs are otherwise clear. No pleural fluid. Airway is unremarkable.  Musculoskeletal: There is asymmetric atrophy of the right shoulder and right chest wall musculature. No worrisome lytic or sclerotic lesions. Degenerative changes are seen in the spine.  CT ABDOMEN AND PELVIS FINDINGS  Hepatobiliary: Liver and gallbladder are unremarkable. No biliary ductal dilatation.  Pancreas:  Negative.  Spleen: Negative.  Adrenals/Urinary Tract: Adrenal glands and kidneys are unremarkable. Ureters are decompressed. Bladder is unremarkable.  Stomach/Bowel: Stomach and small bowel are unremarkable. Appendix is not readily visualized. A fair amount of stool is seen in the colon.  Vascular/Lymphatic: Atherosclerotic calcification of the arterial vasculature without abdominal aortic aneurysm. Scattered lymph nodes are not enlarged by CT size criteria.  Reproductive: Prostate is normal in size.  Other: No free fluid. Mesenteries and peritoneum are unremarkable. Tiny periumbilical hernia contains fat.  Musculoskeletal: No worrisome lytic or sclerotic lesions. Degenerative changes are seen in the spine.  IMPRESSION: 1. No evidence of recurrent lymphoma. 2. Asymmetric atrophy of the right shoulder and right chest wall musculature. No additional findings to explain the patient's pain. 3. Three-vessel coronary artery calcification. 4. Fair amount of stool in the colon is indicative of constipation.   Electronically Signed   By: Lorin Picket M.D.   On: 10/24/2014 11:59   ASSESSMENT AND  PLAN: This is a very pleasant 69 years old white male with history of recurrent Hodgkin's lymphoma last treatment with with palliative radiation in April 2012 and the patient has been observation since that time with no evidence for disease recurrence. The recent CT scan of the neck, chest, abdomen and pelvis showed no evidence for disease recurrence. I discussed the scan results with the patient today.  I recommended for the patient to continue on observation with repeat CBC, comprehensive metabolic panel and LDH in 1 year. He was advised to call immediately if he has any concerning symptoms in the interval.  The patient voices understanding of current disease status and treatment options and is in agreement with the current care plan.  All questions were answered. The patient knows to call the clinic with any problems,  questions or concerns. We can certainly see the patient much sooner if necessary.  Disclaimer: This note was dictated with voice recognition software. Similar sounding words can inadvertently be transcribed and may not be corrected upon review.

## 2015-05-25 IMAGING — CR DG HUMERUS 2V *R*
2 series · 2 of 2 positions shown · non-contrast
Comparison: None.

CLINICAL DATA: Fall with severe right shoulder pain.

EXAM:
RIGHT HUMERUS - 2+ VIEW

[view not recorded (1 of 2)]
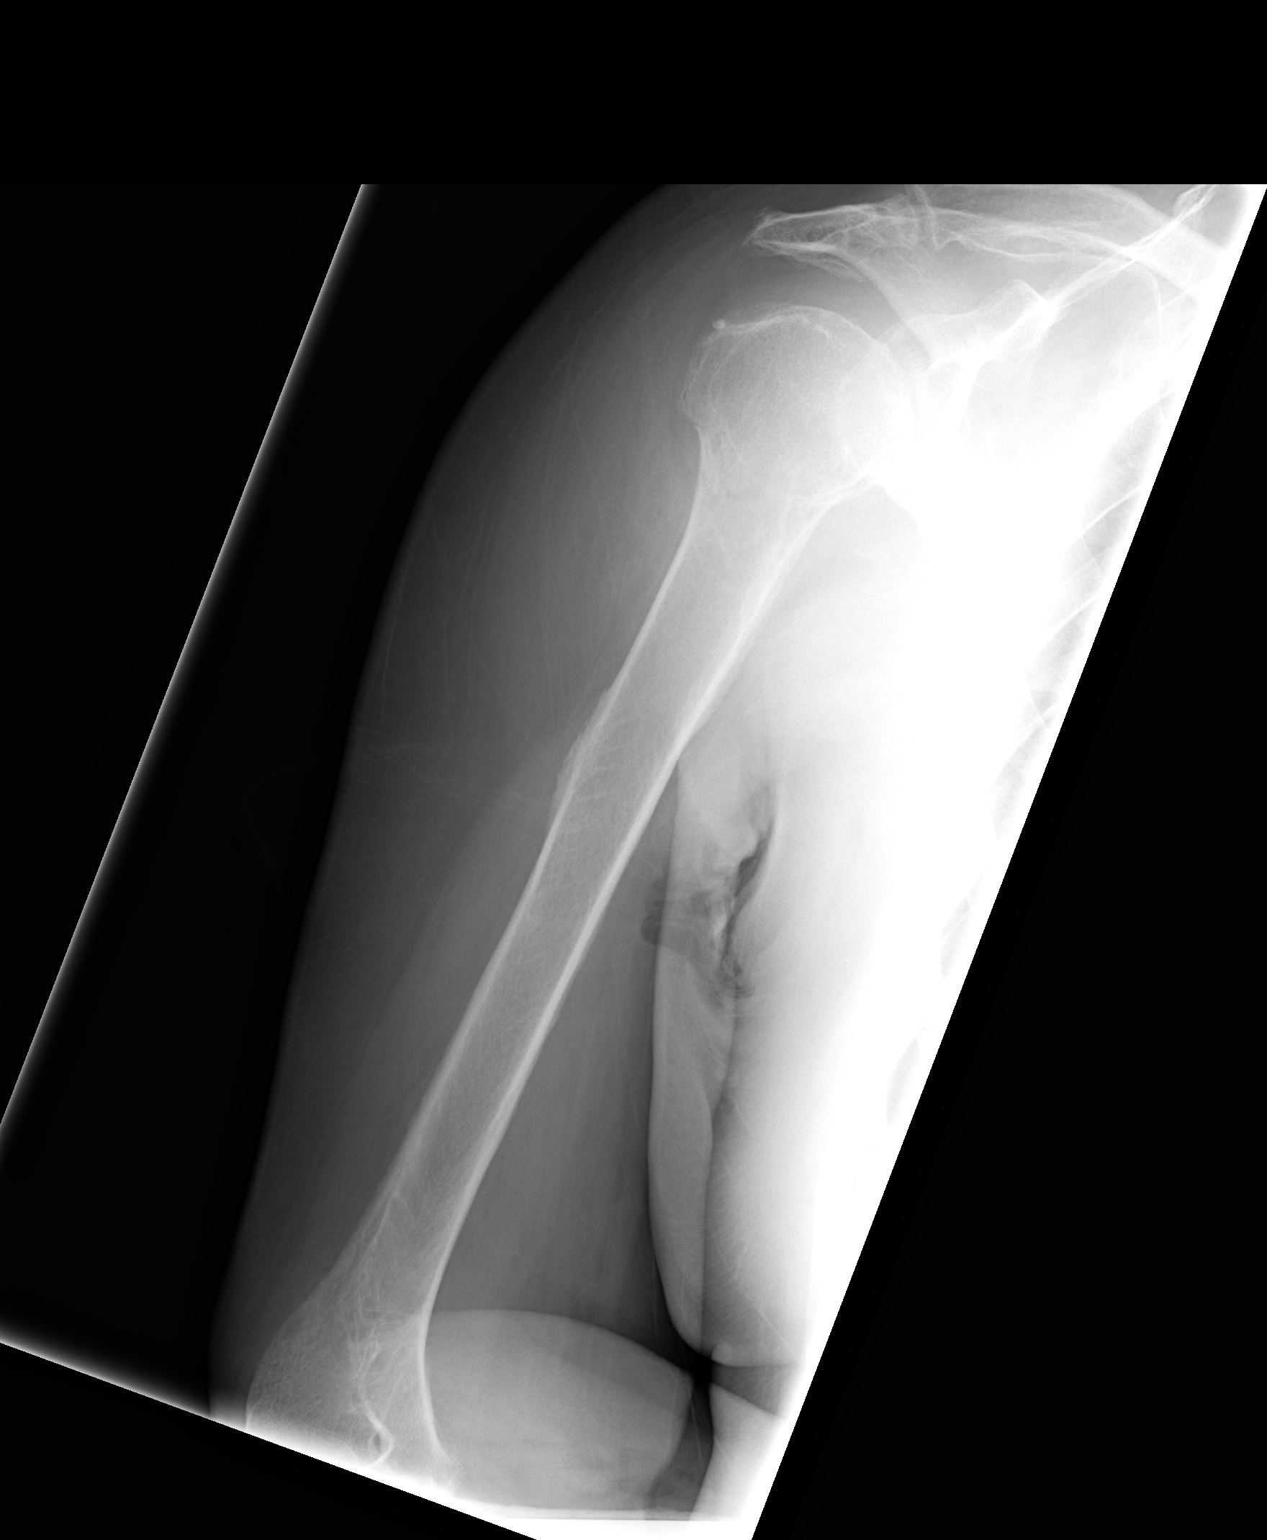

[view not recorded (2 of 2)]
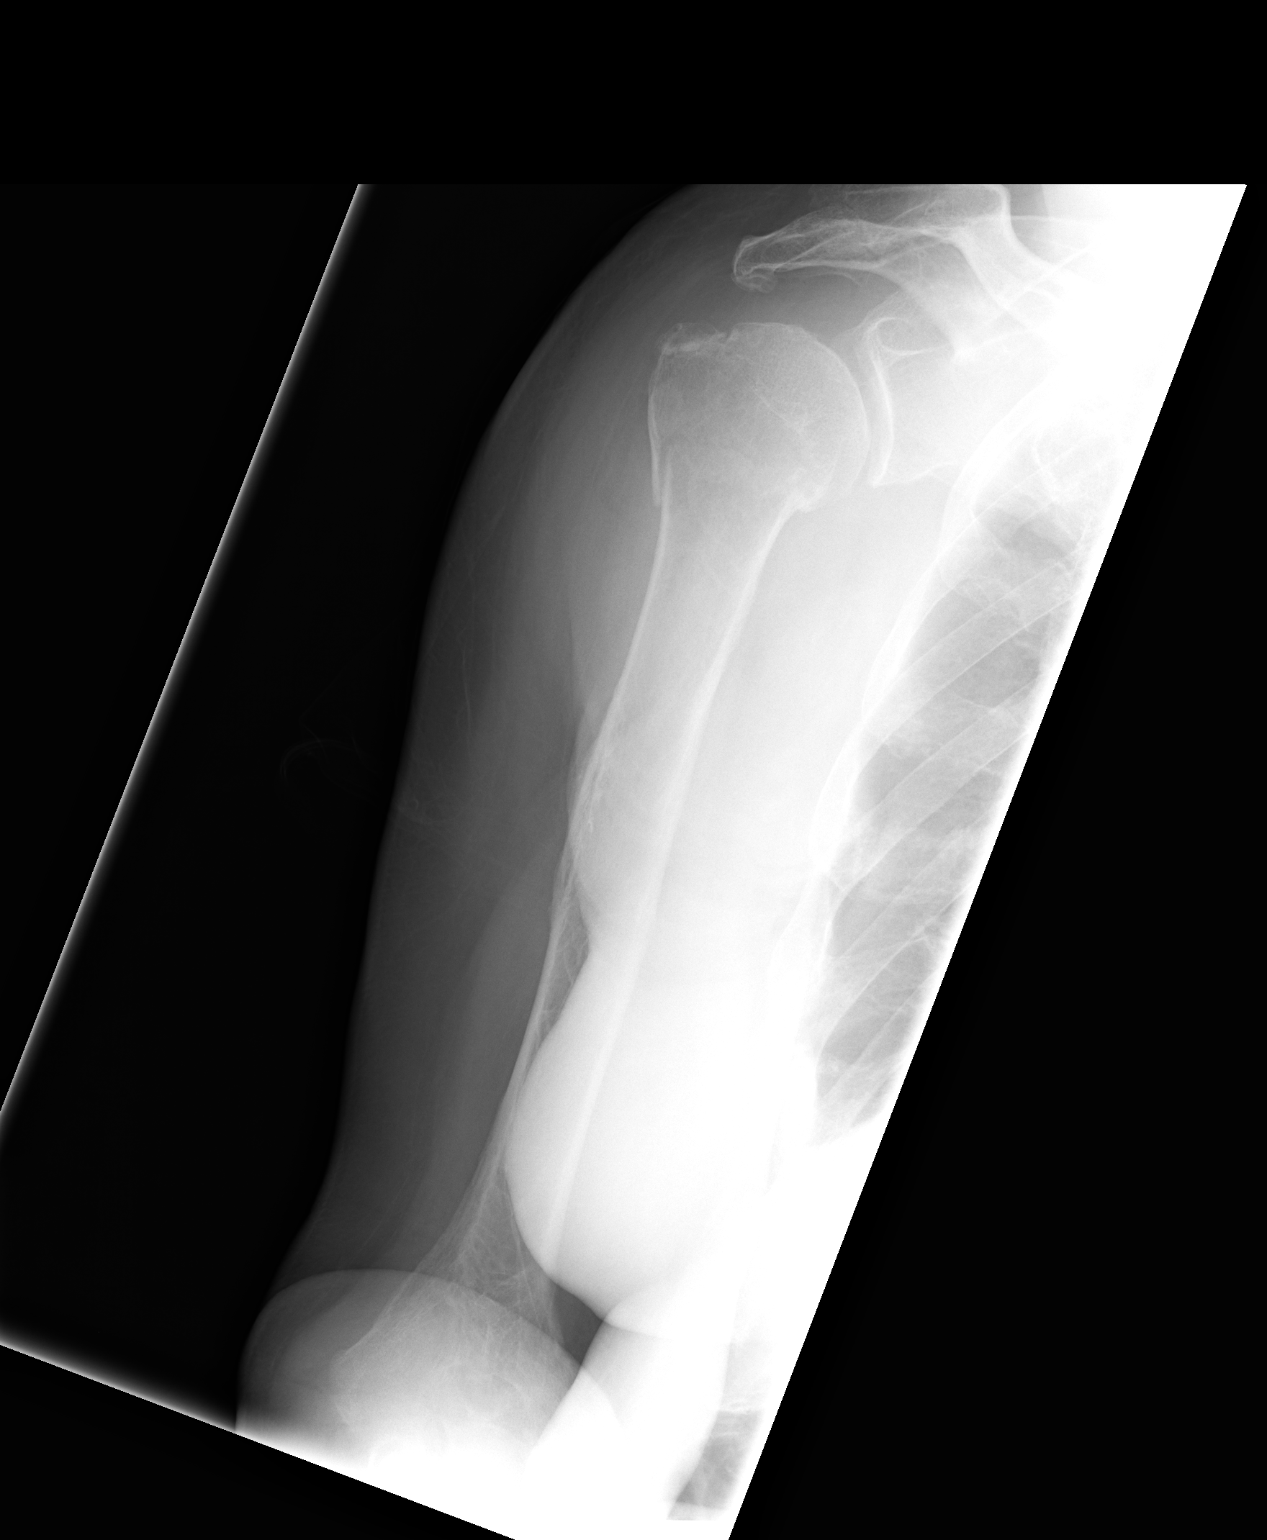

[2 of 2 positions shown; findings below may reference images not displayed]

FINDINGS: Predominantly transverse fracture through the surgical neck of the
proximal right humerus. There is no significant displacement.
Apparent lucency through the greater tuberosity may reflect
degenerative spurring based on dedicated shoulder radiography. The
glenohumeral and acromioclavicular joints are located. No mid or
distal forearm fracture.
IMPRESSION: Acute, nondisplaced surgical neck fracture of the right humerus.

## 2015-11-06 ENCOUNTER — Encounter: Payer: Self-pay | Admitting: Internal Medicine

## 2015-11-06 ENCOUNTER — Telehealth: Payer: Self-pay | Admitting: Internal Medicine

## 2015-11-06 ENCOUNTER — Other Ambulatory Visit (HOSPITAL_BASED_OUTPATIENT_CLINIC_OR_DEPARTMENT_OTHER): Payer: Medicare Other

## 2015-11-06 ENCOUNTER — Ambulatory Visit (HOSPITAL_BASED_OUTPATIENT_CLINIC_OR_DEPARTMENT_OTHER): Payer: Medicare Other | Admitting: Internal Medicine

## 2015-11-06 VITALS — BP 135/70 | HR 59 | Temp 98.5°F | Resp 18 | Ht 68.0 in | Wt 205.6 lb

## 2015-11-06 DIAGNOSIS — D72819 Decreased white blood cell count, unspecified: Secondary | ICD-10-CM

## 2015-11-06 DIAGNOSIS — Z8572 Personal history of non-Hodgkin lymphomas: Secondary | ICD-10-CM

## 2015-11-06 DIAGNOSIS — D696 Thrombocytopenia, unspecified: Secondary | ICD-10-CM

## 2015-11-06 DIAGNOSIS — C8190 Hodgkin lymphoma, unspecified, unspecified site: Secondary | ICD-10-CM

## 2015-11-06 DIAGNOSIS — C8111 Nodular sclerosis classical Hodgkin lymphoma, lymph nodes of head, face, and neck: Secondary | ICD-10-CM

## 2015-11-06 LAB — CBC WITH DIFFERENTIAL/PLATELET
BASO%: 0.3 % (ref 0.0–2.0)
Basophils Absolute: 0 10*3/uL (ref 0.0–0.1)
EOS%: 4.7 % (ref 0.0–7.0)
Eosinophils Absolute: 0.1 10*3/uL (ref 0.0–0.5)
HCT: 39.5 % (ref 38.4–49.9)
HGB: 13 g/dL (ref 13.0–17.1)
LYMPH%: 24.7 % (ref 14.0–49.0)
MCH: 26.2 pg — ABNORMAL LOW (ref 27.2–33.4)
MCHC: 32.9 g/dL (ref 32.0–36.0)
MCV: 79.6 fL (ref 79.3–98.0)
MONO#: 0.3 10*3/uL (ref 0.1–0.9)
MONO%: 8.8 % (ref 0.0–14.0)
NEUT#: 1.8 10*3/uL (ref 1.5–6.5)
NEUT%: 61.5 % (ref 39.0–75.0)
Platelets: 124 10*3/uL — ABNORMAL LOW (ref 140–400)
RBC: 4.96 10*6/uL (ref 4.20–5.82)
RDW: 13.9 % (ref 11.0–14.6)
WBC: 3 10*3/uL — ABNORMAL LOW (ref 4.0–10.3)
lymph#: 0.7 10*3/uL — ABNORMAL LOW (ref 0.9–3.3)

## 2015-11-06 LAB — COMPREHENSIVE METABOLIC PANEL
ALT: 12 U/L (ref 0–55)
AST: 12 U/L (ref 5–34)
Albumin: 4.1 g/dL (ref 3.5–5.0)
Alkaline Phosphatase: 94 U/L (ref 40–150)
Anion Gap: 8 mEq/L (ref 3–11)
BUN: 12.2 mg/dL (ref 7.0–26.0)
CO2: 28 mEq/L (ref 22–29)
Calcium: 9.9 mg/dL (ref 8.4–10.4)
Chloride: 101 mEq/L (ref 98–109)
Creatinine: 1.2 mg/dL (ref 0.7–1.3)
EGFR: 61 mL/min/{1.73_m2} — ABNORMAL LOW (ref >90)
Glucose: 309 mg/dl — ABNORMAL HIGH (ref 70–140)
Potassium: 4.6 mEq/L (ref 3.5–5.1)
Sodium: 137 mEq/L (ref 136–145)
Total Bilirubin: 0.63 mg/dL (ref 0.20–1.20)
Total Protein: 6.8 g/dL (ref 6.4–8.3)

## 2015-11-06 LAB — LACTATE DEHYDROGENASE: LDH: 137 U/L (ref 125–245)

## 2015-11-06 NOTE — Progress Notes (Signed)
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   PRIOR THERAPY:  1. Status post 6 cycles of systemic chemotherapy with ABVD. Last dose was given January 12, 2009. 2. Status post palliative radiotherapy to the recurrent disease in the neck under the care of Dr. Tammi Klippel, completed January 21, 2011.  CURRENT THERAPY: Observation.   INTERVAL HISTORY: Cody Wood 70 y.o. male returns to the clinic today for six-month followup visit. The patient has been observation for the last 7 years. He is feeling fine today with no specific complaints. He has some itching at the skin of the previous radiation port area. The patient denied having any significant chest pain, shortness of breath, cough or hemoptysis. He denied having any significant peripheral lymphadenopathy. He has no fever or chills, no nausea or vomiting. No significant weight loss or night sweats. He had repeat CBC, comprehensive metabolic panel and LDH performed earlier today and he is here for evaluation and discussion of his lab results.   MEDICAL HISTORY: Past Medical History  Diagnosis Date  . 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 Hypertension   . hodgkins lymphoma dx'd 06/2008  . Diabetes mellitus     metformin    ALLERGIES:  has No Known Allergies.  MEDICATIONS:  Current Outpatient Prescriptions  Medication Sig Dispense Refill  . aspirin 325 MG tablet Take 325 mg by mouth daily.    . carvedilol (COREG) 3.125 MG tablet Take 3.125 mg by mouth 2 (two) times daily with a meal.      . HYDROcodone-acetaminophen (NORCO) 7.5-325 MG per tablet Take 1 tablet by mouth every 6 (six) hours as needed.  0  . insulin glargine (LANTUS) 100 UNIT/ML injection Inject 21 Units into the skin at bedtime. If  levels are below 120, take only 20 units    . LANTUS SOLOSTAR 100 UNIT/ML Solostar Pen   0  . losartan (COZAAR) 50 MG tablet Take 50 mg by mouth daily.    Marland Kitchen lovastatin (MEVACOR) 20 MG tablet Take 20 mg by mouth every morning.     . metFORMIN (GLUCOPHAGE) 500 MG tablet Take 500 mg by mouth 2 (two) times daily with a meal.     No current facility-administered medications for this visit.    SURGICAL HISTORY:  Past Surgical History  Procedure Laterality Date  . Knee surgery    . Mandible surgery    . Neck surgery    . Left heart catheterization with coronary angiogram N/A 09/03/2011    Procedure: LEFT HEART CATHETERIZATION WITH CORONARY ANGIOGRAM;  Surgeon: Laverda Page, MD;  Location: [facility] CATH LAB;  Service: Cardiovascular;  Laterality: N/A;    REVIEW OF SYSTEMS:  A comprehensive review of systems was negative.   PHYSICAL EXAMINATION: General appearance: alert, cooperative and no distress Head: Normocephalic, without obvious abnormality, atraumatic Neck: no adenopathy Lymph nodes: Cervical, supraclavicular, and axillary nodes normal. Resp: clear to auscultation bilaterally Cardio: regular rate and rhythm, S1, S2 normal, no murmur, click, rub or gallop GI: soft, non-tender; bowel sounds normal; no masses,  no organomegaly Extremities: extremities normal, atraumatic, no cyanosis or edema  ECOG PERFORMANCE STATUS: 1 - Symptomatic but completely ambulatory  Blood pressure 135/70, pulse 59, temperature 98.5 F (36.9 C), temperature source Oral, resp. rate 18,  height 5\' 8"  (1.727 m), weight 205 lb 9.6 oz (93.26 kg), SpO2 99 %.  LABORATORY DATA: Lab Results  Component Value Date   WBC 3.0* 11/06/2015   HGB 13.0 11/06/2015   HCT 39.5 11/06/2015   MCV 79.6 11/06/2015   PLT 124* 11/06/2015      Chemistry      Component Value Date/Time   NA 136 10/24/2014 1015   NA 140 12/21/2013 1731   NA 143 04/27/2012 1029   K 5.1 10/24/2014 1015   K 4.2 12/21/2013 1731   K 4.4 04/27/2012  1029   CL 100 12/21/2013 1731   CL 101 10/27/2012 1045   CL 98 04/27/2012 1029   CO2 30* 10/24/2014 1015   CO2 26 12/21/2013 1731   CO2 28 04/27/2012 1029   BUN 14.2 10/24/2014 1015   BUN 21 12/21/2013 1731   BUN 18 04/27/2012 1029   CREATININE 1.2 10/24/2014 1015   CREATININE 0.86 12/21/2013 1731   CREATININE 0.9 04/27/2012 1029      Component Value Date/Time   CALCIUM 9.4 10/24/2014 1015   CALCIUM 10.0 12/21/2013 1731   CALCIUM 9.7 04/27/2012 1029   ALKPHOS 83 10/24/2014 1015   ALKPHOS 78 12/21/2013 1731   ALKPHOS 78 04/27/2012 1029   AST 13 10/24/2014 1015   AST 13 12/21/2013 1731   AST 19 04/27/2012 1029   ALT 15 10/24/2014 1015   ALT 10 12/21/2013 1731   ALT 21 04/27/2012 1029   BILITOT 0.51 10/24/2014 1015   BILITOT 0.4 12/21/2013 1731   BILITOT 0.70 04/27/2012 1029       RADIOGRAPHIC STUDIES: No results found. ASSESSMENT AND PLAN: This is a very pleasant 70 years old white male with history of recurrent Hodgkin's lymphoma last treatment with with palliative radiation in April 2012 and the patient has been observation since that time with no evidence for disease recurrence. Her CBC today showed mild leukocytopenia and thrombocytopenia but not much different from a year ago. Comprehensive metabolic panel and LDH are still pending. I discussed the scan results with the patient today.  I recommended for the patient to continue on observation with repeat CBC, comprehensive metabolic panel and LDH in 1 year. He was advised to call immediately if he has any concerning symptoms in the interval.  The patient voices understanding of current disease status and treatment options and is in agreement with the current care plan.  All questions were answered. The patient knows to call the clinic with any problems, questions or concerns. We can certainly see the patient much sooner if necessary.  Disclaimer: This note was dictated with voice recognition software. Similar sounding  words can inadvertently be transcribed and may not be corrected upon review.

## 2015-11-06 NOTE — Telephone Encounter (Signed)
per pof to sch pt appt-gave pt copy fo avs °

## 2016-11-04 ENCOUNTER — Other Ambulatory Visit: Payer: Medicare Other

## 2016-11-04 ENCOUNTER — Ambulatory Visit: Payer: Medicare Other | Admitting: Internal Medicine

## 2017-06-18 ENCOUNTER — Emergency Department (HOSPITAL_COMMUNITY)
Admission: EM | Admit: 2017-06-18 | Discharge: 2017-06-18 | Disposition: A | Discharge status: Home / Self Care | Payer: Medicare Other | Attending: Emergency Medicine | Admitting: Emergency Medicine

## 2017-06-18 ENCOUNTER — Emergency Department (HOSPITAL_COMMUNITY): Payer: Medicare Other

## 2017-06-18 ENCOUNTER — Encounter (HOSPITAL_COMMUNITY): Payer: Self-pay | Admitting: *Deleted

## 2017-06-18 DIAGNOSIS — Y92093 Driveway of other non-institutional residence as the place of occurrence of the external cause: Secondary | ICD-10-CM | POA: Diagnosis not present

## 2017-06-18 DIAGNOSIS — E119 Type 2 diabetes mellitus without complications: Secondary | ICD-10-CM | POA: Diagnosis not present

## 2017-06-18 DIAGNOSIS — Y999 Unspecified external cause status: Secondary | ICD-10-CM | POA: Insufficient documentation

## 2017-06-18 DIAGNOSIS — W01198A Fall on same level from slipping, tripping and stumbling with subsequent striking against other object, initial encounter: Secondary | ICD-10-CM | POA: Diagnosis not present

## 2017-06-18 DIAGNOSIS — S161XXA Strain of muscle, fascia and tendon at neck level, initial encounter: Secondary | ICD-10-CM | POA: Diagnosis not present

## 2017-06-18 DIAGNOSIS — I10 Essential (primary) hypertension: Secondary | ICD-10-CM | POA: Diagnosis not present

## 2017-06-18 DIAGNOSIS — S0083XA Contusion of other part of head, initial encounter: Secondary | ICD-10-CM | POA: Diagnosis not present

## 2017-06-18 DIAGNOSIS — R040 Epistaxis: Secondary | ICD-10-CM | POA: Insufficient documentation

## 2017-06-18 DIAGNOSIS — S39012A Strain of muscle, fascia and tendon of lower back, initial encounter: Secondary | ICD-10-CM | POA: Diagnosis not present

## 2017-06-18 DIAGNOSIS — Z79899 Other long term (current) drug therapy: Secondary | ICD-10-CM | POA: Insufficient documentation

## 2017-06-18 DIAGNOSIS — Y9301 Activity, walking, marching and hiking: Secondary | ICD-10-CM | POA: Insufficient documentation

## 2017-06-18 DIAGNOSIS — Z794 Long term (current) use of insulin: Secondary | ICD-10-CM | POA: Insufficient documentation

## 2017-06-18 DIAGNOSIS — S098XXA Other specified injuries of head, initial encounter: Secondary | ICD-10-CM | POA: Diagnosis present

## 2017-06-18 DIAGNOSIS — Z7982 Long term (current) use of aspirin: Secondary | ICD-10-CM | POA: Insufficient documentation

## 2017-06-18 DIAGNOSIS — Z87891 Personal history of nicotine dependence: Secondary | ICD-10-CM | POA: Insufficient documentation

## 2017-06-18 DIAGNOSIS — W19XXXA Unspecified fall, initial encounter: Secondary | ICD-10-CM

## 2017-06-18 MED ORDER — DOUBLE ANTIBIOTIC 500-10000 UNIT/GM EX OINT
TOPICAL_OINTMENT | Freq: Once | CUTANEOUS | Status: AC
  Administered 2017-06-18: 12:00:00 via TOPICAL
  Filled 2017-06-18: qty 1

## 2017-06-18 NOTE — ED Triage Notes (Addendum)
Pt tripped over his shoestrings this morning and fell face first onto a car and slid down to the ground. Pt has dried blood to left side of forehead and nose. Pt still having some bleeding from nose in triage. Pt also c/o lower back pain. Pt reports slight "fogginess". Denies LOC. A&O x 4.

## 2017-06-18 NOTE — ED Provider Notes (Signed)
[facility] DEPT Provider Note   CSN: [id number] Arrival date & time: 06/18/17  5400     History   Chief Complaint Chief Complaint  Patient presents with  . Fall    HPI Cody Wood is a 71 y.o. male.  Pt presents to the ED after a fall in which he tripped over his shoelaces.  Pt said he was outside in the driveway when he tripped.  His face fell into a car and he landed on his left knee.  He twisted when he fell and c/o low back pain.  The pt took a shower after the injury as he said he had gravel in his wounds.  He denies loc.  The pt is not on blood thinners.  No loc.      Past Medical History:  Diagnosis Date  . 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 Diabetes mellitus    metformin  . hodgkins lymphoma dx'd 06/2008  . Hypertension     Patient Active Problem List   Diagnosis Date Noted  . Muscle weakness (generalized) 01/28/2014  . Pain in joint, upper arm 01/28/2014  . Decreased range of motion of right shoulder 01/28/2014  . Closed fracture of right proximal humerus 12/23/2013  . Personal history of falling, presenting hazards to health 05/05/2012  . Bilateral leg weakness 05/05/2012  . Hodgkin's lymphoma ([place]) 04/29/2012  . Chest pain on exertion 09/03/2011    Past Surgical History:  Procedure Laterality Date  . KNEE SURGERY    . LEFT HEART CATHETERIZATION WITH CORONARY ANGIOGRAM N/A 09/03/2011   Procedure: LEFT HEART CATHETERIZATION WITH CORONARY ANGIOGRAM;  Surgeon: Laverda Page, MD;  Location: [facility] CATH LAB;  Service: Cardiovascular;  Laterality: N/A;  . MANDIBLE SURGERY    . NECK SURGERY         Home Medications    Prior to Admission medications   Medication Sig Start Date End Date Taking? Authorizing Provider  aspirin 81 MG EC tablet Take 325 mg by mouth daily.   Yes [provider]  carvedilol (COREG) 3.125 MG tablet Take 3.125 mg by mouth 2 (two) times daily with a meal.     Yes [provider]  insulin  glargine (LANTUS) 100 UNIT/ML injection Inject 21 Units into the skin at bedtime. If levels are below 120, take only 20 units   Yes [provider]  LANTUS SOLOSTAR 100 UNIT/ML Solostar Pen  10/04/14  Yes [provider]  losartan (COZAAR) 50 MG tablet Take 50 mg by mouth daily. 12/04/13  Yes [provider]  lovastatin (MEVACOR) 20 MG tablet Take 20 mg by mouth every morning.  10/28/13  Yes [provider]  metFORMIN (GLUCOPHAGE) 500 MG tablet Take 500 mg by mouth 2 (two) times daily with a meal.   Yes [provider]    Family History No family history on file.  Social History Social History  Substance Use Topics  . Smoking status: Former Smoker    Quit date: 11/05/1985  . Smokeless tobacco: Current User    Types: Chew  . Alcohol use No     Allergies   Patient has no known allergies.   Review of Systems Review of Systems  HENT: Positive for nosebleeds.   Musculoskeletal: Positive for back pain.       Left knee pain  All other systems reviewed and are negative.    Physical Exam Updated Vital Signs BP 138/60 (BP Location: Left Arm)   Pulse Marland Kitchen)  56   Temp 97.9 F (36.6 C) (Oral)   Resp 18   Ht 5\' 8"  (1.727 m)   Wt 90.7 kg (200 lb)   SpO2 98%   BMI 30.41 kg/m   Physical Exam  Constitutional: He appears well-developed and well-nourished.  HENT:  Head: Normocephalic.    Right Ear: External ear normal.  Left Ear: External ear normal.  Nose: No nasal septal hematoma.    Dried blood in each nare.  No active bleeding.  Eyes: Pupils are equal, round, and reactive to light. Conjunctivae and EOM are normal.  Neck: Normal range of motion. Neck supple.  Cardiovascular: Normal rate, regular rhythm, normal heart sounds and intact distal pulses.   Pulmonary/Chest: Effort normal and breath sounds normal.  Abdominal: Soft. Bowel sounds are normal.  Musculoskeletal:       Left knee: Tenderness found.       Lumbar back: He exhibits  tenderness.       Legs: Neurological: He is alert.  Skin: Skin is warm.  Psychiatric: He has a normal mood and affect. His behavior is normal. Judgment and thought content normal.  Nursing note and vitals reviewed.    ED Treatments / Results  Labs (all labs ordered are listed, but only abnormal results are displayed) Labs Reviewed - No data to display  EKG  EKG Interpretation None       Radiology Dg Lumbar Spine Complete  Result Date: 06/18/2017 CLINICAL DATA:  71 year old who fell in his driveway earlier today and sustained a twisting injury to the low back. Low back pain and tingling in both lower extremities. Initial encounter. EXAM: LUMBAR SPINE - COMPLETE 4+ VIEW COMPARISON:  Bone window images from CT abdomen and pelvis 10/24/2014 and earlier. FINDINGS: 5 non-rib-bearing lumbar vertebrae with anatomic alignment. No acute fractures. Straightening of the usual lumbar lordosis. Anatomic posterior alignment. Severe disc space narrowing at L5-S1. Moderate disc space narrowing at L4-5. Mild disc space narrowing at L1-2, L2-3 and L3-4. No pars defects. Facet degenerative changes at L3-4, L4-5 and L5-S1. No significant interval change since the January, 2016 CT. Abdominal aortic atherosclerosis without evidence of aneurysm. IMPRESSION: 1. No acute osseous abnormality. 2. Straightening of the usual lordosis which may reflect positioning and/or spasm. 3. Multilevel degenerative disc disease, spondylosis and facet degenerative changes, greatest at L5-S1, stable since January, 2016. Electronically Signed   By: Evangeline Dakin M.D.   On: 06/18/2017 11:28   Ct Head Wo Contrast  Result Date: 06/18/2017 CLINICAL DATA:  Facial injury after fall today. EXAM: CT HEAD WITHOUT CONTRAST CT MAXILLOFACIAL WITHOUT CONTRAST CT CERVICAL SPINE WITHOUT CONTRAST TECHNIQUE: Multidetector CT imaging of the head, cervical spine, and maxillofacial structures were performed using the standard protocol without  intravenous contrast. Multiplanar CT image reconstructions of the cervical spine and maxillofacial structures were also generated. COMPARISON:  None. FINDINGS: CT HEAD FINDINGS Brain: No evidence of acute infarction, hemorrhage, hydrocephalus, extra-axial collection or mass lesion/mass effect. Vascular: No hyperdense vessel or unexpected calcification. Skull: Normal. Negative for fracture or focal lesion. Other: None. CT MAXILLOFACIAL FINDINGS Osseous: No fracture or mandibular dislocation. No destructive process. Orbits: Negative. No traumatic or inflammatory finding. Sinuses: Clear. Soft tissues: Negative. CT CERVICAL SPINE FINDINGS Alignment: Status post surgical anterior fusion of C3, C4, C5 and C6. Good alignment of vertebral bodies is noted. Skull base and vertebrae: No acute fracture. No primary bone lesion or focal pathologic process. Soft tissues and spinal canal: No prevertebral fluid or swelling. No visible canal hematoma. Disc levels:  As noted above, status post surgical fusion of C3-4, C4-5 and C5-6. Fusion of C6-7 is also noted which most likely is degenerative in etiology. Upper chest: Negative. Other: None. IMPRESSION: Normal head CT. No significant abnormality seen in maxillofacial region. Postoperative and degenerative changes are noted in the cervical spine. No acute fracture or spondylolisthesis is noted. Electronically Signed   By: Marijo Conception, M.D.   On: 06/18/2017 11:59   Ct Cervical Spine Wo Contrast  Result Date: 06/18/2017 CLINICAL DATA:  Facial injury after fall today. EXAM: CT HEAD WITHOUT CONTRAST CT MAXILLOFACIAL WITHOUT CONTRAST CT CERVICAL SPINE WITHOUT CONTRAST TECHNIQUE: Multidetector CT imaging of the head, cervical spine, and maxillofacial structures were performed using the standard protocol without intravenous contrast. Multiplanar CT image reconstructions of the cervical spine and maxillofacial structures were also generated. COMPARISON:  None. FINDINGS: CT HEAD  FINDINGS Brain: No evidence of acute infarction, hemorrhage, hydrocephalus, extra-axial collection or mass lesion/mass effect. Vascular: No hyperdense vessel or unexpected calcification. Skull: Normal. Negative for fracture or focal lesion. Other: None. CT MAXILLOFACIAL FINDINGS Osseous: No fracture or mandibular dislocation. No destructive process. Orbits: Negative. No traumatic or inflammatory finding. Sinuses: Clear. Soft tissues: Negative. CT CERVICAL SPINE FINDINGS Alignment: Status post surgical anterior fusion of C3, C4, C5 and C6. Good alignment of vertebral bodies is noted. Skull base and vertebrae: No acute fracture. No primary bone lesion or focal pathologic process. Soft tissues and spinal canal: No prevertebral fluid or swelling. No visible canal hematoma. Disc levels: As noted above, status post surgical fusion of C3-4, C4-5 and C5-6. Fusion of C6-7 is also noted which most likely is degenerative in etiology. Upper chest: Negative. Other: None. IMPRESSION: Normal head CT. No significant abnormality seen in maxillofacial region. Postoperative and degenerative changes are noted in the cervical spine. No acute fracture or spondylolisthesis is noted. Electronically Signed   By: Marijo Conception, M.D.   On: 06/18/2017 11:59   Ct Maxillofacial Wo Contrast  Result Date: 06/18/2017 CLINICAL DATA:  Facial injury after fall today. EXAM: CT HEAD WITHOUT CONTRAST CT MAXILLOFACIAL WITHOUT CONTRAST CT CERVICAL SPINE WITHOUT CONTRAST TECHNIQUE: Multidetector CT imaging of the head, cervical spine, and maxillofacial structures were performed using the standard protocol without intravenous contrast. Multiplanar CT image reconstructions of the cervical spine and maxillofacial structures were also generated. COMPARISON:  None. FINDINGS: CT HEAD FINDINGS Brain: No evidence of acute infarction, hemorrhage, hydrocephalus, extra-axial collection or mass lesion/mass effect. Vascular: No hyperdense vessel or unexpected  calcification. Skull: Normal. Negative for fracture or focal lesion. Other: None. CT MAXILLOFACIAL FINDINGS Osseous: No fracture or mandibular dislocation. No destructive process. Orbits: Negative. No traumatic or inflammatory finding. Sinuses: Clear. Soft tissues: Negative. CT CERVICAL SPINE FINDINGS Alignment: Status post surgical anterior fusion of C3, C4, C5 and C6. Good alignment of vertebral bodies is noted. Skull base and vertebrae: No acute fracture. No primary bone lesion or focal pathologic process. Soft tissues and spinal canal: No prevertebral fluid or swelling. No visible canal hematoma. Disc levels: As noted above, status post surgical fusion of C3-4, C4-5 and C5-6. Fusion of C6-7 is also noted which most likely is degenerative in etiology. Upper chest: Negative. Other: None. IMPRESSION: Normal head CT. No significant abnormality seen in maxillofacial region. Postoperative and degenerative changes are noted in the cervical spine. No acute fracture or spondylolisthesis is noted. Electronically Signed   By: Marijo Conception, M.D.   On: 06/18/2017 11:59    Procedures Procedures (including critical care time)  Medications Ordered  in ED Medications  polymixin-bacitracin (POLYSPORIN) ointment ( Topical Given 06/18/17 1213)     Initial Impression / Assessment and Plan / ED Course  I have reviewed the triage vital signs and the nursing notes.  Pertinent labs & imaging results that were available during my care of the patient were reviewed by me and considered in my medical decision making (see chart for details).    Pt did not want a knee x-ray.  He did not want any pain meds.  His abrasions were cleaned and abx ointment was applied.  Pt stable for d/c.  He knows to return if worse.  Final Clinical Impressions(s) / ED Diagnoses   Final diagnoses:  Fall, initial encounter  Contusion of face, initial encounter  Epistaxis  Strain of lumbar region, initial encounter  Strain of neck muscle,  initial encounter    New Prescriptions Discharge Medication List as of 06/18/2017 12:09 PM       Isla Pence, MD 06/18/17 1221

## 2018-01-27 ENCOUNTER — Other Ambulatory Visit: Payer: Self-pay | Admitting: Physician Assistant

## 2018-01-27 DIAGNOSIS — R0989 Other specified symptoms and signs involving the circulatory and respiratory systems: Secondary | ICD-10-CM

## 2018-01-29 ENCOUNTER — Ambulatory Visit
Admission: RE | Admit: 2018-01-29 | Discharge: 2018-01-29 | Disposition: A | Discharge status: Home / Self Care | Payer: Medicare Other | Source: Ambulatory Visit | Attending: Physician Assistant | Admitting: Physician Assistant

## 2018-01-29 DIAGNOSIS — R0989 Other specified symptoms and signs involving the circulatory and respiratory systems: Secondary | ICD-10-CM

## 2018-07-29 LAB — LIPID PANEL
Cholesterol: 146 (ref 0–200)
HDL: 24 — AB (ref 35–70)
LDL Cholesterol: 91
Triglycerides: 332 — AB (ref 40–160)

## 2018-07-29 LAB — HEMOGLOBIN A1C: Hemoglobin A1C: 9.4

## 2019-01-25 LAB — BASIC METABOLIC PANEL
BUN: 40 — AB (ref 4–21)
Creatinine: 1.6 — AB (ref 0.6–1.3)

## 2019-01-25 LAB — HEMOGLOBIN A1C: Hemoglobin A1C: 8.3

## 2019-02-11 ENCOUNTER — Telehealth (HOSPITAL_COMMUNITY): Payer: Self-pay

## 2019-02-11 ENCOUNTER — Encounter: Payer: Self-pay | Admitting: "Endocrinology

## 2019-02-11 ENCOUNTER — Ambulatory Visit (INDEPENDENT_AMBULATORY_CARE_PROVIDER_SITE_OTHER): Payer: Medicare Other | Admitting: "Endocrinology

## 2019-02-11 ENCOUNTER — Other Ambulatory Visit (HOSPITAL_COMMUNITY): Payer: Self-pay | Admitting: Physician Assistant

## 2019-02-11 ENCOUNTER — Other Ambulatory Visit: Payer: Self-pay

## 2019-02-11 VITALS — BP 126/85 | HR 60 | Ht 66.5 in | Wt 214.0 lb

## 2019-02-11 DIAGNOSIS — I10 Essential (primary) hypertension: Secondary | ICD-10-CM | POA: Diagnosis not present

## 2019-02-11 DIAGNOSIS — E782 Mixed hyperlipidemia: Secondary | ICD-10-CM

## 2019-02-11 DIAGNOSIS — N183 Type 2 diabetes mellitus with diabetic chronic kidney disease: Secondary | ICD-10-CM | POA: Insufficient documentation

## 2019-02-11 DIAGNOSIS — E1122 Type 2 diabetes mellitus with diabetic chronic kidney disease: Secondary | ICD-10-CM | POA: Diagnosis not present

## 2019-02-11 DIAGNOSIS — R0989 Other specified symptoms and signs involving the circulatory and respiratory systems: Secondary | ICD-10-CM

## 2019-02-11 DIAGNOSIS — Z794 Long term (current) use of insulin: Secondary | ICD-10-CM

## 2019-02-11 NOTE — Patient Instructions (Signed)

## 2019-02-11 NOTE — Telephone Encounter (Signed)
Left voicemail for patient re: tomorrow's appt

## 2019-02-11 NOTE — Progress Notes (Signed)
Endocrinology Consult Note       02/11/2019, 12:48 PM   Subjective:    Patient ID: Cody Wood, male    DOB: 22-Nov-1945.  Cody Wood is being seen in consultation for management of currently uncontrolled symptomatic diabetes requested by  [place] [place] [facility].   Past Medical History:  Diagnosis Date  . Blockage of coronary artery of heart ([place])   . Diabetes mellitus    metformin  . hodgkins lymphoma dx'd 06/2008  . Hypertension     Past Surgical History:  Procedure Laterality Date  . KNEE SURGERY    . LEFT HEART CATHETERIZATION WITH CORONARY ANGIOGRAM N/A 09/03/2011   Procedure: LEFT HEART CATHETERIZATION WITH CORONARY ANGIOGRAM;  Surgeon: Laverda Page, MD;  Location: [facility] CATH LAB;  Service: Cardiovascular;  Laterality: N/A;  . MANDIBLE SURGERY    . NECK SURGERY      Social History   Socioeconomic History  . Marital status: Divorced    Spouse name: Not on file  . Number of children: Not on file  . Years of education: Not on file  . Highest education level: Not on file  Occupational History  . Not on file  Social Needs  . Financial resource strain: Not on file  . Food insecurity:    Worry: Not on file    Inability: Not on file  . Transportation needs:    Medical: Not on file    Non-medical: Not on file  Tobacco Use  . Smoking status: Former Smoker    Last attempt to quit: 11/05/1985    Years since quitting: 33.2  . Smokeless tobacco: Current User    Types: Chew  Substance and Sexual Activity  . Alcohol use: No  . Drug use: No  . Sexual activity: Not on file  Lifestyle  . Physical activity:    Days per week: Not on file    Minutes per session: Not on file  . Stress: Not on file  Relationships  . Social connections:    Talks on phone: Not on file    Gets together: Not on file    Attends religious service: Not on file    Active member of  club or organization: Not on file    Attends meetings of clubs or organizations: Not on file    Relationship status: Not on file  Other Topics Concern  . Not on file  Social History Narrative  . Not on file    History reviewed. No pertinent family history.  Outpatient Encounter Medications as of 02/11/2019  Medication Sig  . aspirin 81 MG EC tablet Take 325 mg by mouth daily.  . carvedilol (COREG) 3.125 MG tablet Take 3.125 mg by mouth 2 (two) times daily with a meal.    . LANTUS SOLOSTAR 100 UNIT/ML Solostar Pen Inject 30 Units into the skin at bedtime.  Marland Kitchen losartan (COZAAR) 50 MG tablet Take 50 mg by mouth daily.  Marland Kitchen lovastatin (MEVACOR) 20 MG tablet Take 20 mg by mouth every morning.   . [DISCONTINUED] glimepiride (AMARYL) 4 MG tablet 2 (two) times a day.  . [DISCONTINUED] insulin  glargine (LANTUS) 100 UNIT/ML injection Inject 21 Units into the skin at bedtime. If levels are below 120, take only 20 units  . [DISCONTINUED] metFORMIN (GLUCOPHAGE) 500 MG tablet Take 500 mg by mouth 2 (two) times daily with a meal.   No facility-administered encounter medications on file as of 02/11/2019.     ALLERGIES: No Known Allergies  VACCINATION STATUS:  There is no immunization history on file for this patient.  Diabetes  He presents for his initial diabetic visit. He has type 2 diabetes mellitus. Onset time: He was diagnosed at approximate age of 51 years. His disease course has been fluctuating. Hypoglycemia symptoms include confusion, dizziness, headaches, hunger, sweats and tremors. Pertinent negatives for hypoglycemia include no pallor or seizures. Associated symptoms include polydipsia and polyuria. Pertinent negatives for diabetes include no chest pain, no fatigue, no polyphagia and no weakness. Hypoglycemia complications include blackouts and required assistance. Symptoms are worsening. Diabetic complications include heart disease and nephropathy. Risk factors for coronary artery disease  include diabetes mellitus, dyslipidemia, male sex, hypertension, sedentary lifestyle and tobacco exposure. Current diabetic treatment includes insulin injections and oral agent (monotherapy) (He is currently on Lantus 22 units nightly, glimepiride 4 mg p.o. twice daily.). His weight is increasing steadily. He is following a generally unhealthy diet. When asked about meal planning, he reported none. He has not had a previous visit with a dietitian. He never participates in exercise. (He did not bring any logs nor meter to review today.  He reports significant hypoglycemia happening randomly and requiring assistance by 30%.  His most recent A1c was 8.3%.) An ACE inhibitor/angiotensin II receptor blocker is being taken. Eye exam is current.  Hypertension  This is a chronic problem. The current episode started more than 1 year ago. The problem is controlled. Associated symptoms include headaches and sweats. Pertinent negatives include no chest pain, neck pain, palpitations or shortness of breath. Risk factors for coronary artery disease include diabetes mellitus, dyslipidemia, male gender, obesity, smoking/tobacco exposure, sedentary lifestyle and family history. Past treatments include angiotensin blockers. Hypertensive end-organ damage includes CAD/MI.  Hyperlipidemia  This is a chronic problem. The current episode started more than 1 year ago. Exacerbating diseases include diabetes and obesity. Factors aggravating his hyperlipidemia include smoking. Pertinent negatives include no chest pain, myalgias or shortness of breath. Current antihyperlipidemic treatment includes statins. Risk factors for coronary artery disease include dyslipidemia, diabetes mellitus, hypertension, male sex, obesity and a sedentary lifestyle.   Review of Systems  Constitutional: Negative for chills, fatigue, fever and unexpected weight change.  HENT: Negative for dental problem, mouth sores and trouble swallowing.   Eyes: Negative for  visual disturbance.  Respiratory: Negative for cough, choking, chest tightness, shortness of breath and wheezing.   Cardiovascular: Negative for chest pain, palpitations and leg swelling.  Gastrointestinal: Negative for abdominal distention, abdominal pain, constipation, diarrhea, nausea and vomiting.  Endocrine: Positive for polydipsia and polyuria. Negative for polyphagia.  Genitourinary: Negative for dysuria, flank pain, hematuria and urgency.  Musculoskeletal: Negative for back pain, gait problem, myalgias and neck pain.  Skin: Negative for pallor, rash and wound.  Neurological: Positive for dizziness, tremors and headaches. Negative for seizures, syncope, weakness and numbness.  Psychiatric/Behavioral: Positive for confusion. Negative for dysphoric mood.    Objective:    BP 126/85   Wood 60   Ht 5' 6.5" (1.689 m)   Wt 214 lb (97.1 kg)   BMI 34.02 kg/m   Wt Readings from Last 3 Encounters:  02/11/19 214 lb (97.1  kg)  06/18/17 200 lb (90.7 kg)  11/06/15 205 lb 9.6 oz (93.3 kg)     Physical Exam Constitutional:      General: He is not in acute distress.    Appearance: He is well-developed.  HENT:     Head: Normocephalic and atraumatic.  Neck:     Musculoskeletal: Normal range of motion and neck supple.     Thyroid: No thyromegaly.     Trachea: No tracheal deviation.  Cardiovascular:     Rate and Rhythm: Normal rate.     Pulses:          Dorsalis pedis pulses are 1+ on the right side and 1+ on the left side.       Posterior tibial pulses are 1+ on the right side and 1+ on the left side.     Heart sounds: S1 normal and S2 normal. No murmur. No gallop.   Pulmonary:     Effort: Pulmonary effort is normal. No respiratory distress.     Breath sounds: No wheezing.  Abdominal:     General: There is no distension.     Tenderness: There is no abdominal tenderness. There is no guarding.  Musculoskeletal:     Right shoulder: He exhibits no swelling and no deformity.  Skin:     General: Skin is warm and dry.     Findings: No rash.     Nails: There is no clubbing.   Neurological:     Mental Status: He is alert and oriented to person, place, and time.     Cranial Nerves: No cranial nerve deficit.     Sensory: No sensory deficit.     Gait: Gait normal.     Deep Tendon Reflexes: Reflexes are normal and symmetric.  Psychiatric:        Speech: Speech normal.        Behavior: Behavior normal. Behavior is cooperative.        Thought Content: Thought content normal.        Judgment: Judgment normal.     CMP     Component Value Date/Time   NA 137 11/06/2015 0950   K 4.6 11/06/2015 0950   CL 100 12/21/2013 1731   CL 101 10/27/2012 1045   CO2 28 11/06/2015 0950   GLUCOSE 309 (H) 11/06/2015 0950   GLUCOSE 174 (H) 10/27/2012 1045   BUN 40 (A) 01/25/2019   BUN 12.2 11/06/2015 0950   CREATININE 1.6 (A) 01/25/2019   CREATININE 1.2 11/06/2015 0950   CALCIUM 9.9 11/06/2015 0950   PROT 6.8 11/06/2015 0950   ALBUMIN 4.1 11/06/2015 0950   AST 12 11/06/2015 0950   ALT 12 11/06/2015 0950   ALKPHOS 94 11/06/2015 0950   BILITOT 0.63 11/06/2015 0950   GFRNONAA 87 (L) 12/21/2013 1731   GFRAA >90 12/21/2013 1731     Diabetic Labs (most recent): Lab Results  Component Value Date   HGBA1C 8.3 01/25/2019   HGBA1C 9.4 07/29/2018    Lipid Panel     Component Value Date/Time   CHOL 146 07/29/2018   TRIG 332 (A) 07/29/2018   HDL 24 (A) 07/29/2018   [place] 91 07/29/2018     Assessment & Plan:   1. Type 2 diabetes mellitus with stage 3 chronic kidney disease, with long-term current use of insulin (HCC)  - Cody Wood has currently uncontrolled symptomatic type 2 DM since 73 years of age,  with most recent A1c of 8.3% improving from 9.4 %. Recent labs  reviewed. - I had a long discussion with him about the progressive nature of diabetes and the pathology behind its complications. -his diabetes is complicated by renal artery disease, CKD, random intermittent  severe hypoglycemia and he remains at a high risk for more acute and chronic complications which include CAD, CVA, CKD, retinopathy, and neuropathy. These are all discussed in detail with him.  - I have counseled him on diet management  by adopting a carbohydrate restricted/protein rich diet. - he admits that there is a room for improvement in his food and drink choices. - Suggestion is made for him to avoid simple carbohydrates  from his diet including Cakes, Sweet Desserts, Ice Cream, Soda (diet and regular), Sweet Tea, Candies, Chips, Cookies, Store Bought Juices, Alcohol in Excess of  1-2 drinks a day, Artificial Sweeteners,  Coffee Creamer, and "Sugar-free" Products. This will help patient to have more stable blood glucose profile and potentially avoid unintended weight gain.  - I encouraged him to switch to  unprocessed or minimally processed complex starch and increased protein intake (animal or plant source), fruits, and vegetables.  - he is advised to stick to a routine mealtimes to eat 3 meals  a day and avoid unnecessary snacks ( to snack only to correct hypoglycemia).   - he will be scheduled with Jearld Fenton, RDN, CDE for individualized diabetes education.  - I have approached him with the following individualized plan to manage diabetes and patient agrees:   -#1 priority in the management of his diabetes would be to avoid hypoglycemia from inadvertent use of medications including insulin.  I have advised him to discontinue his glimepiride since this is the likely cause of his hypoglycemia. -I discussed and increase his Lantus to 30 units nightly, associated with monitoring of blood glucose 2 times a day-daily before breakfast and at bedtime.  - he is warned not to take insulin without proper monitoring per orders. - Adjustment parameters are given to him for hypo and hyperglycemia in writing. - he is encouraged to call clinic for blood glucose levels less than 70 or above 300 mg  /dl.  - he is not a candidate for metformin  nor SGLT2 inhibitors due to concurrent renal insufficiency.  - he will be considered for incretin therapy as appropriate next visit.  - Patient specific target  A1c;  LDL, HDL, Triglycerides, and  Waist Circumference were discussed in detail.  2) Blood Pressure /Hypertension:  his blood pressure is  controlled to target.   he is advised to continue his current medications including losartan 50 mg p.o. daily with breakfast . 3) Lipids/Hyperlipidemia:   Review of his recent lipid panel showed  uncontrolled  LDL at 91 .  he  is advised to continue    lovastatin 20 mg daily at bedtime.  Side effects and precautions discussed with him.  4)  Weight/Diet:  Body mass index is 34.02 kg/m.  -   clearly complicating his diabetes care.  I discussed with him the fact that loss of 5 - 10% of his  current body weight will have the most impact on his diabetes management.  CDE Consult will be initiated . Exercise, and detailed carbohydrates information provided  -  detailed on discharge instructions.  5) Chronic Care/Health Maintenance:  -he  is on ACEI/ARB and Statin medications and  is encouraged to initiate and continue to follow up with Ophthalmology, Dentist,  Podiatrist at least yearly or according to recommendations, and advised to  stay away from  smoking. I have recommended yearly flu vaccine and pneumonia vaccine at least every 5 years; moderate intensity exercise for up to 150 minutes weekly; and  sleep for at least 7 hours a day.  - he is  advised to maintain close follow up with [place] [place] [facility] for primary care needs, as well as his other providers for optimal and coordinated care.  - Time spent with the patient: 45 minutes, of which >50% was spent in obtaining information about his symptoms, reviewing his previous labs/studies, evaluations, and treatments, counseling him about his currently uncontrolled, symptomatic, complicated  type 2 diabetes; hypertension, hyperlipidemia, and developing plans for long term treatment based on the latest standards of care/guidelines.  Please refer to " Patient Self Inventory" in the Media  tab for reviewed elements of pertinent patient history.  Cody Wood participated in the discussions, expressed understanding, and voiced agreement with the above plans.  All questions were answered to his satisfaction. he is encouraged to contact clinic should he have any questions or concerns prior to his return visit.  Follow up plan: - Return in about 3 months (around 05/14/2019) for Meter, and Logs.  Glade Lloyd, MD [facility] [address] [place], [place] [address] Phone: [phone]  Fax: [phone]    02/11/2019, 12:48 PM  This note was partially dictated with voice recognition software. Similar sounding words can be transcribed inadequately or may not  be corrected upon review.

## 2019-02-12 ENCOUNTER — Ambulatory Visit (HOSPITAL_COMMUNITY)
Admission: RE | Admit: 2019-02-12 | Discharge: 2019-02-12 | Disposition: A | Discharge status: Home / Self Care | Payer: Medicare Other | Source: Ambulatory Visit | Attending: Family | Admitting: Family

## 2019-02-12 DIAGNOSIS — R0989 Other specified symptoms and signs involving the circulatory and respiratory systems: Secondary | ICD-10-CM

## 2019-02-25 ENCOUNTER — Other Ambulatory Visit: Payer: Self-pay

## 2019-02-25 ENCOUNTER — Encounter: Payer: Self-pay | Admitting: Nutrition

## 2019-02-25 ENCOUNTER — Encounter: Payer: Medicare Other | Attending: Physician Assistant | Admitting: Nutrition

## 2019-02-25 DIAGNOSIS — E118 Type 2 diabetes mellitus with unspecified complications: Secondary | ICD-10-CM | POA: Insufficient documentation

## 2019-02-25 DIAGNOSIS — E1165 Type 2 diabetes mellitus with hyperglycemia: Secondary | ICD-10-CM | POA: Insufficient documentation

## 2019-02-25 DIAGNOSIS — E669 Obesity, unspecified: Secondary | ICD-10-CM

## 2019-02-25 DIAGNOSIS — IMO0002 Reserved for concepts with insufficient information to code with codable children: Secondary | ICD-10-CM

## 2019-02-25 NOTE — Patient Instructions (Addendum)
Goals Follow My Plate 50% of lunch and dinner should be lower carb vegetables. Eat 3-4 carb choices per meal. Cut out snacks. Increase high fiber foods Avoid processed and high salt foods Walk 15 minutes a day Take medications as prescribed. Don't skip meals. Get A1C down to 7%

## 2019-02-25 NOTE — Progress Notes (Signed)
Medical Nutrition Therapy:  Appt start time: 0900 end time:  1000.   Assessment:  Primary concerns today:Diabetes Type 2,  See Dr. Dorris Fetch., Endocrinology. Lives by himself with a few of his kids. He does the shopping and his kids cook.   Eats 2 meals per day. Skips lunch. Lantus 30 units of lantus.    Taking Red Rice Yeast twice a day to help lower cholesterol. FBS 200-300's. Recently was taken off of metformin due to elevated Creatine levels. Has cut out sweets and cookies.  Complains of Increased urination, increased thirsty, fatigue and some blurry vision. Willing to make changes with his diet. Wants to use insulin pen and doesn't like the V go anymore. [facility] and will need a sample of insulin til his comes in mail. Lab Results  Component Value Date   HGBA1C 8.3 01/25/2019    Lipid Panel     Component Value Date/Time   CHOL 146 07/29/2018   TRIG 332 (A) 07/29/2018   HDL 24 (A) 07/29/2018   LDLCALC 91 07/29/2018    CMP Latest Ref Rng & Units 01/25/2019 11/06/2015 10/24/2014  Glucose 70 - 140 mg/dl - 309(H) 304(H)  BUN 4 - 21 40(A) 12.2 14.2  Creatinine 0.6 - 1.3 1.6(A) 1.2 1.2  Sodium 136 - 145 mEq/L - 137 136  Potassium 3.5 - 5.1 mEq/L - 4.6 5.1  Chloride 96 - 112 mEq/L - - -  CO2 22 - 29 mEq/L - 28 30(H)  Calcium 8.4 - 10.4 mg/dL - 9.9 9.4  Total Protein 6.4 - 8.3 g/dL - 6.8 6.4  Total Bilirubin 0.20 - 1.20 mg/dL - 0.63 0.51  Alkaline Phos 40 - 150 U/L - 94 83  AST 5 - 34 U/L - 12 13  ALT 0 - 55 U/L - 12 15    Preferred Learning Style:    No preference indicated   Learning Readiness:  Ready  Change in progress   MEDICATIONS:    DIETARY INTAKE:   24-hr recall:  B ( AM): 2 eggs, sausage, bacon or corn flakes with whole milk,   Snk ( AM): crackers-ritz 12 L ( PM): skipped Snk ( PM): Diet Dr. Malachi Bonds D ( PM): Pork chop, mashed potatoes, water Snk ( PM):  Beverages: water ,diet soda  Usual physical activity: ADL  Estimated  energy needs: 1800  calories 200 g carbohydrates 135 g protein 50 g fat  Progress Towards Goal(s):  In progress.   Nutritional Diagnosis:  NB-1.1 Food and nutrition-related knowledge deficit As related to Diabetes Typde 2, .  As evidenced by A1C.8.3%    Intervention:  Nutrition and Diabetes education provided on My Plate, CHO counting, meal planning, portion sizes, timing of meals, avoiding snacks between meals unless having a low blood sugar, target ranges for A1C and blood sugars, signs/symptoms and treatment of hyper/hypoglycemia, monitoring blood sugars, taking medications as prescribed, benefits of exercising 30 minutes per day and prevention of complications of DM. Marland KitchenGoals Follow My Plate 50% of lunch and dinner should be lower carb vegetables. Eat 3-4 carb choices per meal. Cut out snacks. Increase high fiber foods Avoid processed and high salt foods Walk 15 minutes a day Take medications as prescribed. Don't skip meals. Get A1C down to 7%  Teaching Method Utilized:  Visual Auditory Hands on  Handouts given during visit include:  Verbally went over My Plate  Barriers to learning/adherence to lifestyle change: limited mobility  Demonstrated degree of understanding via:  Teach Back  Monitoring/Evaluation:  Dietary intake, exercise, , and body weight in 1 month(s).

## 2019-04-12 ENCOUNTER — Ambulatory Visit: Payer: Medicare Other | Admitting: Nutrition

## 2019-05-04 ENCOUNTER — Other Ambulatory Visit: Payer: Self-pay | Admitting: *Deleted

## 2019-05-04 ENCOUNTER — Ambulatory Visit (INDEPENDENT_AMBULATORY_CARE_PROVIDER_SITE_OTHER): Payer: Medicare Other | Admitting: Vascular Surgery

## 2019-05-04 ENCOUNTER — Other Ambulatory Visit: Payer: Self-pay

## 2019-05-04 ENCOUNTER — Encounter: Payer: Self-pay | Admitting: *Deleted

## 2019-05-04 DIAGNOSIS — I739 Peripheral vascular disease, unspecified: Secondary | ICD-10-CM | POA: Diagnosis not present

## 2019-05-04 NOTE — Progress Notes (Signed)
Patient name: Cody Wood MRN: [id number] DOB: 1945/11/03 Sex: male  REASON FOR CONSULT: Abnormal ABIs  HPI: Cody Wood is a 73 y.o. male, with history of coronary artery disease, hypertension, hyperlipidemia, diabetes, Hodgkins that presents for evaluation of PAD and abnormal ABIs.  Patient states he has had trouble with his left leg for several years.  His initial complaints include swelling as well as pain in his calf whenever he is walking.  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  Ultimately the symptoms have progressed over the last couple years.  He states now can only walk about 150 feet to his mailbox before he has to stop with severe pain in his left calf.  The pain always resolves when he stops.  No rest pain at night.  No tissue loss.  No previous lower extremity interventions.  States he quit smoking in 2005 but does chew tobacco.  Past Medical History:  Diagnosis Date  . Blockage of coronary artery of heart ([place])   . Diabetes mellitus    metformin  . hodgkins lymphoma dx'd 06/2008  . Hypertension     Past Surgical History:  Procedure Laterality Date  . KNEE SURGERY    . LEFT HEART CATHETERIZATION WITH CORONARY ANGIOGRAM N/A 09/03/2011   Procedure: LEFT HEART CATHETERIZATION WITH CORONARY ANGIOGRAM;  Surgeon: Laverda Page, MD;  Location: [facility] CATH LAB;  Service: Cardiovascular;  Laterality: N/A;  . MANDIBLE SURGERY    . NECK SURGERY      No family history on file.  SOCIAL HISTORY: Social History   Socioeconomic History  . Marital status: Divorced    Spouse name: Not on file  . Number of children: Not on file  . Years of education: Not on file  . Highest education level: Not on file  Occupational History  . Not on file  Social Needs  . Financial resource strain: Not on file  . Food insecurity    Worry: Not on file    Inability: Not on file  .  Transportation needs    Medical: Not on file    Non-medical: Not on file  Tobacco Use  . Smoking status: Former Smoker    Quit date: 11/05/1985    Years since quitting: 33.5  . Smokeless tobacco: Current User    Types: Chew  Substance and Sexual Activity  . Alcohol use: No  . Drug use: No  . Sexual activity: Not on file  Lifestyle  . Physical activity    Days per week: Not on file    Minutes per session: Not on file  . Stress: Not on file  Relationships  . Social Herbalist on phone: Not on file    Gets together: Not on file    Attends religious service: Not on file    Active member of club or organization: Not on file    Attends meetings of clubs or organizations: Not on file    Relationship status: Not on file  . Intimate partner violence    Fear of current or ex partner: Not on file    Emotionally abused: Not on file    Physically abused: Not on file    Forced sexual activity: Not on file  Other Topics Concern  . Not on file  Social History Narrative  . Not on file    No Known  Allergies  Current Outpatient Medications  Medication Sig Dispense Refill  . aspirin 81 MG EC tablet Take 325 mg by mouth daily.    . carvedilol (COREG) 3.125 MG tablet Take 3.125 mg by mouth 2 (two) times daily with a meal.      . LANTUS SOLOSTAR 100 UNIT/ML Solostar Pen Inject 30 Units into the skin at bedtime.  0  . losartan (COZAAR) 50 MG tablet Take 50 mg by mouth daily.    Marland Kitchen lovastatin (MEVACOR) 20 MG tablet Take 20 mg by mouth every morning.      No current facility-administered medications for this visit.     REVIEW OF SYSTEMS:  [X]  denotes positive finding, [ ]  denotes negative finding Cardiac  Comments:  Chest pain or chest pressure:    Shortness of breath upon exertion:    Short of breath when lying flat:    Irregular heart rhythm:        Vascular    Pain in calf, thigh, or hip brought on by ambulation: x Left leg  Pain in feet at night that wakes you up from  your sleep:     Blood clot in your veins:    Leg swelling:         Pulmonary    Oxygen at home:    Productive cough:     Wheezing:         Neurologic    Sudden weakness in arms or legs:     Sudden numbness in arms or legs:     Sudden onset of difficulty speaking or slurred speech:    Temporary loss of vision in one eye:     Problems with dizziness:         Gastrointestinal    Blood in stool:     Vomited blood:         Genitourinary    Burning when urinating:     Blood in urine:        Psychiatric    Major depression:         Hematologic    Bleeding problems:    Problems with blood clotting too easily:        Skin    Rashes or ulcers:        Constitutional    Fever or chills:      PHYSICAL EXAM: Vitals:   05/04/19 1155  BP: (!) 149/69  Pulse: 61  Resp: 12  Temp: 97.6 F (36.4 C)  TempSrc: Temporal  SpO2: 98%  Weight: 221 lb 11.2 oz (100.6 kg)  Height: 5\' 8"  (1.727 m)    GENERAL: The patient is a well-nourished male, in no acute distress. The vital signs are documented above. CARDIAC: There is a regular rate and rhythm.  VASCULAR:  2+ palpable femoral pulses bilaterally 1+ right DP palpable No palpable left pedal pulses No lower extremity tissue loss PULMONARY: There is good air exchange bilaterally without wheezing or rales. ABDOMEN: Soft and non-tender with normal pitched bowel sounds.  MUSCULOSKELETAL: There are no major deformities or cyanosis. NEUROLOGIC: No focal weakness or paresthesias are detected. SKIN: There are no ulcers or rashes noted. PSYCHIATRIC: The patient has a normal affect.  DATA:   ABIs are noncompressible in the right with a triphasic waveform 0.91 on the left with a monophasic waveform (like inaccurate ABI given right non-compressible).  Toe pressure on the left is severely reduced at 46.  Assessment/Plan:  73 year old male the presents with peripheral arterial disease and symptoms of  short distance lifestyle limiting  claudication of the left lower extremity.  He does have a monophasic waveform at the left ankle with a severely reduced toe pressure of 46.  His ABI is like inaccurate.  His symptoms are still consistent with claudication and he can only walk about 150 feet now and has become more debilitated over the last several years.  Discussed that claudication is not typically considered a limb threatening situation.  That being said patient states he would like something done to try and improve his quality of life.  We will plan for left lower extremity arteriogram possible intervention at next available date and all risks and benefits were discussed in detail.   [name], MD [facility] [place] Office: [phone] Pager: [phone]

## 2019-05-10 ENCOUNTER — Other Ambulatory Visit (HOSPITAL_COMMUNITY)
Admission: RE | Admit: 2019-05-10 | Discharge: 2019-05-10 | Disposition: A | Discharge status: Home / Self Care | Payer: Medicare Other | Source: Ambulatory Visit | Attending: Vascular Surgery | Admitting: Vascular Surgery

## 2019-05-10 ENCOUNTER — Other Ambulatory Visit: Payer: Self-pay

## 2019-05-10 DIAGNOSIS — Z20828 Contact with and (suspected) exposure to other viral communicable diseases: Secondary | ICD-10-CM | POA: Diagnosis not present

## 2019-05-10 DIAGNOSIS — Z01812 Encounter for preprocedural laboratory examination: Secondary | ICD-10-CM | POA: Insufficient documentation

## 2019-05-10 LAB — SARS CORONAVIRUS 2 (TAT 6-24 HRS): SARS Coronavirus 2: NEGATIVE

## 2019-05-12 ENCOUNTER — Ambulatory Visit (HOSPITAL_COMMUNITY)
Admission: RE | Admit: 2019-05-12 | Discharge: 2019-05-12 | Disposition: A | Discharge status: Home / Self Care | Payer: Medicare Other | Attending: Vascular Surgery | Admitting: Vascular Surgery

## 2019-05-12 ENCOUNTER — Other Ambulatory Visit: Payer: Self-pay

## 2019-05-12 ENCOUNTER — Encounter (HOSPITAL_COMMUNITY)
Admission: RE | Disposition: A | Discharge status: Home / Self Care | Payer: Self-pay | Source: Home / Self Care | Attending: Vascular Surgery

## 2019-05-12 ENCOUNTER — Other Ambulatory Visit: Payer: Self-pay | Admitting: *Deleted

## 2019-05-12 DIAGNOSIS — Z79899 Other long term (current) drug therapy: Secondary | ICD-10-CM | POA: Diagnosis not present

## 2019-05-12 DIAGNOSIS — Z794 Long term (current) use of insulin: Secondary | ICD-10-CM | POA: Insufficient documentation

## 2019-05-12 DIAGNOSIS — E1122 Type 2 diabetes mellitus with diabetic chronic kidney disease: Secondary | ICD-10-CM | POA: Insufficient documentation

## 2019-05-12 DIAGNOSIS — I129 Hypertensive chronic kidney disease with stage 1 through stage 4 chronic kidney disease, or unspecified chronic kidney disease: Secondary | ICD-10-CM | POA: Diagnosis not present

## 2019-05-12 DIAGNOSIS — I70212 Atherosclerosis of native arteries of extremities with intermittent claudication, left leg: Secondary | ICD-10-CM | POA: Insufficient documentation

## 2019-05-12 DIAGNOSIS — E1151 Type 2 diabetes mellitus with diabetic peripheral angiopathy without gangrene: Secondary | ICD-10-CM | POA: Diagnosis present

## 2019-05-12 DIAGNOSIS — E785 Hyperlipidemia, unspecified: Secondary | ICD-10-CM | POA: Diagnosis not present

## 2019-05-12 DIAGNOSIS — Z87891 Personal history of nicotine dependence: Secondary | ICD-10-CM | POA: Insufficient documentation

## 2019-05-12 DIAGNOSIS — N189 Chronic kidney disease, unspecified: Secondary | ICD-10-CM | POA: Diagnosis not present

## 2019-05-12 DIAGNOSIS — I70211 Atherosclerosis of native arteries of extremities with intermittent claudication, right leg: Secondary | ICD-10-CM

## 2019-05-12 DIAGNOSIS — I2510 Atherosclerotic heart disease of native coronary artery without angina pectoris: Secondary | ICD-10-CM | POA: Insufficient documentation

## 2019-05-12 DIAGNOSIS — Z7982 Long term (current) use of aspirin: Secondary | ICD-10-CM | POA: Diagnosis not present

## 2019-05-12 HISTORY — PX: ABDOMINAL AORTOGRAM W/LOWER EXTREMITY: CATH118223

## 2019-05-12 LAB — POCT I-STAT, CHEM 8
BUN: 32 mg/dL — ABNORMAL HIGH (ref 8–23)
Calcium, Ion: 1.25 mmol/L (ref 1.15–1.40)
Chloride: 102 mmol/L (ref 98–111)
Creatinine, Ser: 1.5 mg/dL — ABNORMAL HIGH (ref 0.61–1.24)
Glucose, Bld: 215 mg/dL — ABNORMAL HIGH (ref 70–99)
HCT: 38 % — ABNORMAL LOW (ref 39.0–52.0)
Hemoglobin: 12.9 g/dL — ABNORMAL LOW (ref 13.0–17.0)
Potassium: 4.3 mmol/L (ref 3.5–5.1)
Sodium: 140 mmol/L (ref 135–145)
TCO2: 29 mmol/L (ref 22–32)

## 2019-05-12 LAB — GLUCOSE, CAPILLARY
Glucose-Capillary: 178 mg/dL — ABNORMAL HIGH (ref 70–99)
Glucose-Capillary: 165 mg/dL — ABNORMAL HIGH (ref 70–99)
Glucose-Capillary: 174 mg/dL — ABNORMAL HIGH (ref 70–99)

## 2019-05-12 SURGERY — ABDOMINAL AORTOGRAM W/LOWER EXTREMITY
Anesthesia: LOCAL

## 2019-05-12 MED ORDER — ACETAMINOPHEN 325 MG PO TABS
650.0000 mg | ORAL_TABLET | ORAL | Status: DC | PRN
  Administered 2019-05-12: 650 mg via ORAL
  Filled 2019-05-12: qty 2

## 2019-05-12 MED ORDER — HEPARIN (PORCINE) IN NACL 1000-0.9 UT/500ML-% IV SOLN
INTRAVENOUS | Status: AC
  Filled 2019-05-12: qty 1000

## 2019-05-12 MED ORDER — LIDOCAINE HCL (PF) 1 % IJ SOLN
INTRAMUSCULAR | Status: DC | PRN
  Administered 2019-05-12: 15 mL via INTRADERMAL

## 2019-05-12 MED ORDER — FENTANYL CITRATE (PF) 100 MCG/2ML IJ SOLN
INTRAMUSCULAR | Status: DC | PRN
  Administered 2019-05-12: 25 ug via INTRAVENOUS

## 2019-05-12 MED ORDER — SODIUM CHLORIDE 0.9% FLUSH: 3.0000 mL | INTRAVENOUS | Status: DC | PRN

## 2019-05-12 MED ORDER — ONDANSETRON HCL 4 MG/2ML IJ SOLN: 4.0000 mg | Freq: Four times a day (QID) | INTRAMUSCULAR | Status: DC | PRN

## 2019-05-12 MED ORDER — SODIUM CHLORIDE 0.9 % IV SOLN
INTRAVENOUS | Status: DC
  Administered 2019-05-12: 09:00:00 via INTRAVENOUS

## 2019-05-12 MED ORDER — MIDAZOLAM HCL 2 MG/2ML IJ SOLN
INTRAMUSCULAR | Status: DC | PRN
  Administered 2019-05-12: 1 mg via INTRAVENOUS

## 2019-05-12 MED ORDER — FENTANYL CITRATE (PF) 100 MCG/2ML IJ SOLN
INTRAMUSCULAR | Status: AC
  Filled 2019-05-12: qty 2

## 2019-05-12 MED ORDER — LIDOCAINE HCL (PF) 1 % IJ SOLN
INTRAMUSCULAR | Status: AC
  Filled 2019-05-12: qty 30

## 2019-05-12 MED ORDER — SODIUM CHLORIDE 0.9% FLUSH: 3.0000 mL | Freq: Two times a day (BID) | INTRAVENOUS | Status: DC

## 2019-05-12 MED ORDER — SODIUM CHLORIDE 0.9 % IV SOLN: 250.0000 mL | INTRAVENOUS | Status: DC | PRN

## 2019-05-12 MED ORDER — SODIUM CHLORIDE 0.9 % IV SOLN: INTRAVENOUS | Status: AC

## 2019-05-12 MED ORDER — LABETALOL HCL 5 MG/ML IV SOLN: 10.0000 mg | INTRAVENOUS | Status: DC | PRN

## 2019-05-12 MED ORDER — IODIXANOL 320 MG/ML IV SOLN
INTRAVENOUS | Status: DC | PRN
  Administered 2019-05-12: 55 mL via INTRA_ARTERIAL

## 2019-05-12 MED ORDER — MIDAZOLAM HCL 2 MG/2ML IJ SOLN
INTRAMUSCULAR | Status: AC
  Filled 2019-05-12: qty 2

## 2019-05-12 MED ORDER — HYDRALAZINE HCL 20 MG/ML IJ SOLN: 5.0000 mg | INTRAMUSCULAR | Status: DC | PRN

## 2019-05-12 MED ORDER — HEPARIN (PORCINE) IN NACL 1000-0.9 UT/500ML-% IV SOLN
INTRAVENOUS | Status: DC | PRN
  Administered 2019-05-12 (×2): 500 mL

## 2019-05-12 SURGICAL SUPPLY — 17 items
CATH OMNI FLUSH 5F 65CM (CATHETERS) ×1 IMPLANT
CATH SOFT-VU 4F 65 STRAIGHT (CATHETERS) IMPLANT
CATH SOFT-VU STRAIGHT 4F 65CM (CATHETERS) ×2
CATH STRAIGHT 5FR 65CM (CATHETERS) ×1 IMPLANT
DEVICE TORQUE .025-.038 (MISCELLANEOUS) ×1 IMPLANT
FILTER CO2 0.2 MICRON (VASCULAR PRODUCTS) ×1 IMPLANT
GLIDEWIRE ADV .035X180CM (WIRE) ×1 IMPLANT
KIT MICROPUNCTURE NIT STIFF (SHEATH) ×1 IMPLANT
KIT PV (KITS) ×2 IMPLANT
RESERVOIR CO2 (VASCULAR PRODUCTS) ×1 IMPLANT
SET FLUSH CO2 (MISCELLANEOUS) ×1 IMPLANT
SHEATH PINNACLE 5F 10CM (SHEATH) ×1 IMPLANT
SHEATH PROBE COVER 6X72 (BAG) ×1 IMPLANT
SYR MEDRAD MARK V 150ML (SYRINGE) ×1 IMPLANT
TRANSDUCER W/STOPCOCK (MISCELLANEOUS) ×2 IMPLANT
TRAY PV CATH (CUSTOM PROCEDURE TRAY) ×2 IMPLANT
WIRE BENTSON .035X145CM (WIRE) ×1 IMPLANT

## 2019-05-12 NOTE — Op Note (Signed)
    Patient name: SUHAAN PERLEBERG MRN: [id number] DOB: 07-17-1946 Sex: male  05/12/2019 Pre-operative Diagnosis: Short distance lifestyle limiting claudication of the left lower extremity Post-operative diagnosis:  Same Surgeon:  [name], MD Procedure Performed: 1.  Ultrasound-guided access of the right common femoral artery 2.  CO2 aortogram 3.  Left lower extremity arteriogram with selection of second-order branches 4.  46 minutes of monitored moderate conscious sedation time  Indications: Patient is a 73 year old male who presents with short distance lifestyle limting claudication of the left lower extremity.  He had noncompressible ABIs at the ankle with monophasic runoff.  He presents today for left lower extremity arteriogram and possible intervention after risk and benefits were discussed.  Findings:  CO2 aortogram was performed given chronic kidney disease.  Ultimately there was no evidence of flow-limiting aortoiliac stenosis.  On the left which is the side of interest he had about a 50% calcified plaque in the distal common femoral artery and also had a high-grade greater than 95% stenosis of both the proximal SFA and profunda at the ostium.  Flow down the SFA very sluggish distally.  The profunda filled more briskly.  The SFA filled retrograde off profunda branches but appeared aside from ostial stenosis the remaining SFA popliteal artery all appeared patent with no flow-limiting stenosis.  The patient had three-vessel runoff in the left lower extremity.   Procedure:  The patient was identified in the holding area and taken to room 8.  The patient was then placed supine on the table and prepped and draped in the usual sterile fashion.  A time out was called.  Ultrasound was used to evaluate the right common femoral artery.  It was patent .  A digital ultrasound image was acquired.  A micropuncture needle was used to access the right common femoral artery under ultrasound  guidance.  An 018 wire was advanced without resistance and a micropuncture sheath was placed.  The 018 wire was removed and a benson wire was placed.  The micropuncture sheath was exchanged for a 5 french sheath.  An omniflush catheter was advanced over the wire to the level of L-1.  An abdominal angiogram was obtained.  I attempted to cross tapered bifurcation with a Bentson wire and the Omni Flush catheter.  Given the aortic bifurcation, I did not have enough support.  I exchanged for a straight 4 French flush catheter and that also would not track over the aortic bifurcation.  Finally I used a Glidewire advantage guided down with profunda for additional support and was able to place a Omni Flush catheter down to the left common femoral artery.  Dedicated left lower extremity imaging was obtained with pertinent findings noted above.  Given the plan to proceed with open intervention, wires and catheters were removed.  Given right common femoral artery disease he will be taken to the PACU for sheath removal.  Plan: The patient will be considered for left common femoral endarterectomy with both profundoplasty and patch onto the SFA.  [name], MD [facility] [place] Office: [phone] Pager: [place]

## 2019-05-12 NOTE — Discharge Instructions (Signed)

## 2019-05-12 NOTE — Progress Notes (Signed)
Site area: rt groin fa sheath Site Prior to Removal:  Level 0 Pressure Applied For:  20 minutes Manual:    yes Patient Status During Pull:  stable Post Pull Site:  Level  0 Post Pull Instructions Given:  yes Post Pull Pulses Present: rt dp palpable Dressing Applied:  Gauze and tegaderm Bedrest begins @ 1110 Comments:

## 2019-05-12 NOTE — H&P (Signed)
History and Physical Interval Note:  05/12/2019 9:09 AM  Cody Wood  has presented today for surgery, with the diagnosis of claudication.  The various methods of treatment have been discussed with the patient and family. After consideration of risks, benefits and other options for treatment, the patient has consented to  Procedure(s): ABDOMINAL AORTOGRAM W/LOWER EXTREMITY (N/A) as a surgical intervention.  The patient's history has been reviewed, patient examined, no change in status, stable for surgery.  I have reviewed the patient's chart and labs.  Questions were answered to the patient's satisfaction.    Short distance lifestyle limiting claudication left leg.  [name]  Patient name: Cody Wood          MRN: [id number]        DOB: 16-Jun-1946          Sex: male  REASON FOR CONSULT: Abnormal ABIs  HPI: Cody Wood is a 73 y.o. male, with history of coronary artery disease, hypertension, hyperlipidemia, diabetes, Hodgkins that presents for evaluation of PAD and abnormal ABIs.  Patient states he has had trouble with his left leg for several years.  His initial complaints include swelling as well as pain in his calf whenever he is walking.  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  Ultimately the symptoms have progressed over the last couple years.  He states now can only walk about 150 feet to his mailbox before he has to stop with severe pain in his left calf.  The pain always resolves when he stops.  No rest pain at night.  No tissue loss.  No previous lower extremity interventions.  States he quit smoking in 2005 but does chew tobacco.      Past Medical History:  Diagnosis Date  . Blockage of coronary artery of heart ([place])   . Diabetes mellitus    metformin  . hodgkins lymphoma dx'd 06/2008  . Hypertension          Past Surgical History:  Procedure  Laterality Date  . KNEE SURGERY    . LEFT HEART CATHETERIZATION WITH CORONARY ANGIOGRAM N/A 09/03/2011   Procedure: LEFT HEART CATHETERIZATION WITH CORONARY ANGIOGRAM;  Surgeon: Laverda Page, MD;  Location: [facility] CATH LAB;  Service: Cardiovascular;  Laterality: N/A;  . MANDIBLE SURGERY    . NECK SURGERY      No family history on file.  SOCIAL HISTORY: Social History        Socioeconomic History  . Marital status: Divorced    Spouse name: Not on file  . Number of children: Not on file  . Years of education: Not on file  . Highest education level: Not on file  Occupational History  . Not on file  Social Needs  . Financial resource strain: Not on file  . Food insecurity    Worry: Not on file    Inability: Not on file  . Transportation needs    Medical: Not on file    Non-medical: Not on file  Tobacco Use  . Smoking status: Former Smoker    Quit date: 11/05/1985    Years since quitting: 33.5  . Smokeless tobacco: Current User    Types: Chew  Substance and Sexual Activity  . Alcohol use: No  . Drug use: No  . Sexual activity: Not on file  Lifestyle  . Physical activity    Days  per week: Not on file    Minutes per session: Not on file  . Stress: Not on file  Relationships  . Social Herbalist on phone: Not on file    Gets together: Not on file    Attends religious service: Not on file    Active member of club or organization: Not on file    Attends meetings of clubs or organizations: Not on file    Relationship status: Not on file  . Intimate partner violence    Fear of current or ex partner: Not on file    Emotionally abused: Not on file    Physically abused: Not on file    Forced sexual activity: Not on file  Other Topics Concern  . Not on file  Social History Narrative  . Not on file    No Known Allergies        Current Outpatient Medications  Medication Sig Dispense Refill  . aspirin 81 MG  EC tablet Take 325 mg by mouth daily.    . carvedilol (COREG) 3.125 MG tablet Take 3.125 mg by mouth 2 (two) times daily with a meal.      . LANTUS SOLOSTAR 100 UNIT/ML Solostar Pen Inject 30 Units into the skin at bedtime.  0  . losartan (COZAAR) 50 MG tablet Take 50 mg by mouth daily.    Marland Kitchen lovastatin (MEVACOR) 20 MG tablet Take 20 mg by mouth every morning.      No current facility-administered medications for this visit.     REVIEW OF SYSTEMS:  [X]  denotes positive finding, [ ]  denotes negative finding Cardiac  Comments:  Chest pain or chest pressure:    Shortness of breath upon exertion:    Short of breath when lying flat:    Irregular heart rhythm:        Vascular    Pain in calf, thigh, or hip brought on by ambulation: x Left leg  Pain in feet at night that wakes you up from your sleep:     Blood clot in your veins:    Leg swelling:         Pulmonary    Oxygen at home:    Productive cough:     Wheezing:         Neurologic    Sudden weakness in arms or legs:     Sudden numbness in arms or legs:     Sudden onset of difficulty speaking or slurred speech:    Temporary loss of vision in one eye:     Problems with dizziness:         Gastrointestinal    Blood in stool:     Vomited blood:         Genitourinary    Burning when urinating:     Blood in urine:        Psychiatric    Major depression:         Hematologic    Bleeding problems:    Problems with blood clotting too easily:        Skin    Rashes or ulcers:        Constitutional    Fever or chills:      PHYSICAL EXAM:    Vitals:   05/04/19 1155  BP: (!) 149/69  Wood: 61  Resp: 12  Temp: 97.6 F (36.4 C)  TempSrc: Temporal  SpO2: 98%  Weight: 221 lb 11.2 oz (100.6 kg)  Height: 5'  8" (1.727 m)    GENERAL: The patient is a well-nourished male, in no acute distress. The vital signs are  documented above. CARDIAC: There is a regular rate and rhythm.  VASCULAR:  2+ palpable femoral pulses bilaterally 1+ right DP palpable No palpable left pedal pulses No lower extremity tissue loss PULMONARY: There is good air exchange bilaterally without wheezing or rales. ABDOMEN: Soft and non-tender with normal pitched bowel sounds.  MUSCULOSKELETAL: There are no major deformities or cyanosis. NEUROLOGIC: No focal weakness or paresthesias are detected. SKIN: There are no ulcers or rashes noted. PSYCHIATRIC: The patient has a normal affect.  DATA:   ABIs are noncompressible in the right with a triphasic waveform 0.91 on the left with a monophasic waveform (like inaccurate ABI given right non-compressible).  Toe pressure on the left is severely reduced at 46.  Assessment/Plan:  73 year old male the presents with peripheral arterial disease and symptoms of short distance lifestyle limiting claudication of the left lower extremity.  He does have a monophasic waveform at the left ankle with a severely reduced toe pressure of 46.  His ABI is like inaccurate.  His symptoms are still consistent with claudication and he can only walk about 150 feet now and has become more debilitated over the last several years.  Discussed that claudication is not typically considered a limb threatening situation.  That being said patient states he would like something done to try and improve his quality of life.  We will plan for left lower extremity arteriogram possible intervention at next available date and all risks and benefits were discussed in detail.   [name], MD [facility] [place] Office: [phone] Pager: [place]

## 2019-05-13 ENCOUNTER — Encounter (HOSPITAL_COMMUNITY): Payer: Self-pay | Admitting: Vascular Surgery

## 2019-05-14 ENCOUNTER — Ambulatory Visit: Payer: Medicare Other | Admitting: "Endocrinology

## 2019-05-17 ENCOUNTER — Encounter (HOSPITAL_COMMUNITY): Payer: Self-pay | Admitting: Vascular Surgery

## 2019-05-18 NOTE — Progress Notes (Signed)
[id number] - [place], [address] [place] [address] Phone: [phone] Fax: [phone]      Your procedure is scheduled on August 24th.  Report to [facility] Main Entrance "A" at 7:30 A.M., and check in at the Admitting office.  Call this number if you have problems the morning of surgery:  [phone]  Call [phone] if you have any questions prior to your surgery date Monday-Friday 8am-4pm    Remember:  Do not eat or drink after midnight the night before your surgery   Take these medicines the morning of surgery with A SIP OF WATER   Carvedilol (Coreg)  7 days prior to surgery STOP taking any Aspirin (unless otherwise instructed by your surgeon), Aleve, Naproxen, Ibuprofen, Motrin, Advil, Goody's, BC's, all herbal medications, fish oil, and all vitamins.   WHAT DO I DO ABOUT MY DIABETES MEDICATION?    Marland Kitchen Do not take oral diabetes medicines (pills) the morning of surgery. - Amaryl  . THE NIGHT BEFORE SURGERY, DO NOT take PM dose of Lantus.      . THE MORNING OF SURGERY, DO NOT take Lantus.  . The day of surgery, do not take other diabetes injectables, including Byetta (exenatide), Bydureon (exenatide ER), Victoza (liraglutide), or Trulicity (dulaglutide).  . If your CBG is greater than 220 mg/dL, you may take  of your sliding scale (correction) dose of insulin.   How to Manage Your Diabetes Before and After Surgery  Why is it important to control my blood sugar before and after surgery? . Improving blood sugar levels before and after surgery helps healing and can limit problems. . A way of improving blood sugar control is eating a healthy diet by: o  Eating less sugar and carbohydrates o  Increasing activity/exercise o  Talking with your doctor about reaching your blood sugar goals . High blood sugars (greater than 180 mg/dL) can raise your risk of infections and slow  your recovery, so you will need to focus on controlling your diabetes during the weeks before surgery. . Make sure that the doctor who takes care of your diabetes knows about your planned surgery including the date and location.  How do I manage my blood sugar before surgery? . Check your blood sugar at least 4 times a day, starting 2 days before surgery, to make sure that the level is not too high or low. o Check your blood sugar the morning of your surgery when you wake up and every 2 hours until you get to the Short Stay unit. . If your blood sugar is less than 70 mg/dL, you will need to treat for low blood sugar: o Do not take insulin. o Treat a low blood sugar (less than 70 mg/dL) with  cup of clear juice (cranberry or apple), 4 glucose tablets, OR glucose gel. o Recheck blood sugar in 15 minutes after treatment (to make sure it is greater than 70 mg/dL). If your blood sugar is not greater than 70 mg/dL on recheck, call [phone] for further instructions. . Report your blood sugar to the short stay nurse when you get to Short Stay.  . If you are admitted to the hospital after surgery: o Your blood sugar will be checked by the staff and you will probably be given insulin after surgery (instead of oral diabetes medicines) to make sure you have good blood sugar levels. o  The goal for blood sugar control after surgery is 80-180 mg/dL.      The Morning of Surgery  Do not wear jewelry.  Do not wear lotions, powders, colognes, or deodorant  Men may shave face and neck.  Do not bring valuables to the hospital.  [facility] is not responsible for any belongings or valuables.  If you are a smoker, DO NOT Smoke 24 hours prior to surgery IF you wear a CPAP at night please bring your mask, tubing, and machine the morning of surgery   Remember that you must have someone to transport you home after your surgery, and remain with you for 24 hours if you are discharged the same  day.   Contacts, glasses, hearing aids, dentures or bridgework may not be worn into surgery.    Leave your suitcase in the car.  After surgery it may be brought to your room.  For patients admitted to the hospital, discharge time will be determined by your treatment team.  Patients discharged the day of surgery will not be allowed to drive home.    Special instructions:   [place]- Preparing For Surgery  Before surgery, you can play an important role. Because skin is not sterile, your skin needs to be as free of germs as possible. You can reduce the number of germs on your skin by washing with CHG (chlorahexidine gluconate) Soap before surgery.  CHG is an antiseptic cleaner which kills germs and bonds with the skin to continue killing germs even after washing.    Oral Hygiene is also important to reduce your risk of infection.  Remember - BRUSH YOUR TEETH THE MORNING OF SURGERY WITH YOUR REGULAR TOOTHPASTE  Please do not use if you have an allergy to CHG or antibacterial soaps. If your skin becomes reddened/irritated stop using the CHG.  Do not shave (including legs and underarms) for at least 48 hours prior to first CHG shower. It is OK to shave your face.  Please follow these instructions carefully.   1. Shower the NIGHT BEFORE SURGERY and the MORNING OF SURGERY with CHG Soap.   2. If you chose to wash your hair, wash your hair first as usual with your normal shampoo.  3. After you shampoo, rinse your hair and body thoroughly to remove the shampoo.  4. Use CHG as you would any other liquid soap. You can apply CHG directly to the skin and wash gently with a scrungie or a clean washcloth.   5. Apply the CHG Soap to your body ONLY FROM THE NECK DOWN.  Do not use on open wounds or open sores. Avoid contact with your eyes, ears, mouth and genitals (private parts). Wash Face and genitals (private parts)  with your normal soap.   6. Wash thoroughly, paying special attention to the  area where your surgery will be performed.  7. Thoroughly rinse your body with warm water from the neck down.  8. DO NOT shower/wash with your normal soap after using and rinsing off the CHG Soap.  9. Pat yourself dry with a CLEAN TOWEL.  10. Wear CLEAN PAJAMAS to bed the night before surgery, wear comfortable clothes the morning of surgery  11. Place CLEAN SHEETS on your bed the night of your first shower and DO NOT SLEEP WITH PETS.    Day of Surgery:  Do not apply any deodorants/lotions. Please shower the morning of surgery with the CHG soap  Please wear clean clothes to the hospital/surgery center.  Remember to brush your teeth WITH YOUR REGULAR TOOTHPASTE.   Please read over the following fact sheets that you were given.

## 2019-05-19 ENCOUNTER — Other Ambulatory Visit: Payer: Self-pay

## 2019-05-19 ENCOUNTER — Encounter (HOSPITAL_COMMUNITY): Payer: Self-pay | Admitting: Vascular Surgery

## 2019-05-19 ENCOUNTER — Encounter (HOSPITAL_COMMUNITY)
Admission: RE | Admit: 2019-05-19 | Discharge: 2019-05-19 | Disposition: A | Discharge status: Home / Self Care | Payer: Medicare Other | Source: Ambulatory Visit | Attending: Family Medicine | Admitting: Family Medicine

## 2019-05-19 DIAGNOSIS — Z20828 Contact with and (suspected) exposure to other viral communicable diseases: Secondary | ICD-10-CM | POA: Diagnosis not present

## 2019-05-19 DIAGNOSIS — Z01812 Encounter for preprocedural laboratory examination: Secondary | ICD-10-CM | POA: Insufficient documentation

## 2019-05-19 HISTORY — DX: Unspecified chronic bronchitis: J42

## 2019-05-19 HISTORY — DX: Peripheral vascular disease, unspecified: I73.9

## 2019-05-19 LAB — SURGICAL PCR SCREEN
MRSA, PCR: NEGATIVE
Staphylococcus aureus: NEGATIVE

## 2019-05-19 LAB — COMPREHENSIVE METABOLIC PANEL
ALT: 27 U/L (ref 0–44)
AST: 21 U/L (ref 15–41)
Albumin: 3.8 g/dL (ref 3.5–5.0)
Alkaline Phosphatase: 72 U/L (ref 38–126)
Anion gap: 10 (ref 5–15)
BUN: 30 mg/dL — ABNORMAL HIGH (ref 8–23)
CO2: 22 mmol/L (ref 22–32)
Calcium: 9.5 mg/dL (ref 8.9–10.3)
Chloride: 105 mmol/L (ref 98–111)
Creatinine, Ser: 1.77 mg/dL — ABNORMAL HIGH (ref 0.61–1.24)
GFR calc Af Amer: 43 mL/min — ABNORMAL LOW (ref >60)
GFR calc non Af Amer: 37 mL/min — ABNORMAL LOW (ref >60)
Glucose, Bld: 276 mg/dL — ABNORMAL HIGH (ref 70–99)
Potassium: 4.6 mmol/L (ref 3.5–5.1)
Sodium: 137 mmol/L (ref 135–145)
Total Bilirubin: 0.8 mg/dL (ref 0.3–1.2)
Total Protein: 6.2 g/dL — ABNORMAL LOW (ref 6.5–8.1)

## 2019-05-19 LAB — HEMOGLOBIN A1C
Hgb A1c MFr Bld: 9.8 % — ABNORMAL HIGH (ref 4.8–5.6)
Mean Plasma Glucose: 234.56 mg/dL

## 2019-05-19 LAB — BLOOD GAS, ARTERIAL
Acid-Base Excess: 1.4 mmol/L (ref 0.0–2.0)
Bicarbonate: 25.6 mmol/L (ref 20.0–28.0)
Drawn by: 470591
FIO2: 21
O2 Saturation: 97.8 %
Patient temperature: 98.6
pCO2 arterial: 41.6 mmHg (ref 32.0–48.0)
pH, Arterial: 7.406 (ref 7.350–7.450)
pO2, Arterial: 103 mmHg (ref 83.0–108.0)

## 2019-05-19 LAB — GLUCOSE, CAPILLARY: Glucose-Capillary: 285 mg/dL — ABNORMAL HIGH (ref 70–99)

## 2019-05-19 LAB — URINALYSIS, ROUTINE W REFLEX MICROSCOPIC
Bacteria, UA: NONE SEEN
Bilirubin Urine: NEGATIVE
Glucose, UA: >=500 mg/dL — AB
Hgb urine dipstick: NEGATIVE
Ketones, ur: NEGATIVE mg/dL
Leukocytes,Ua: NEGATIVE
Nitrite: NEGATIVE
Protein, ur: NEGATIVE mg/dL
Specific Gravity, Urine: 1.017 (ref 1.005–1.030)
pH: 5 (ref 5.0–8.0)

## 2019-05-19 LAB — CBC
HCT: 36.8 % — ABNORMAL LOW (ref 39.0–52.0)
Hemoglobin: 11.9 g/dL — ABNORMAL LOW (ref 13.0–17.0)
MCH: 26.6 pg (ref 26.0–34.0)
MCHC: 32.3 g/dL (ref 30.0–36.0)
MCV: 82.3 fL (ref 80.0–100.0)
Platelets: 170 10*3/uL (ref 150–400)
RBC: 4.47 MIL/uL (ref 4.22–5.81)
RDW: 13.1 % (ref 11.5–15.5)
WBC: 4.3 10*3/uL (ref 4.0–10.5)
nRBC: 0 % (ref 0.0–0.2)

## 2019-05-19 LAB — TYPE AND SCREEN
ABO/RH(D): A POS
Antibody Screen: NEGATIVE

## 2019-05-19 LAB — PROTIME-INR
INR: 1.1 (ref 0.8–1.2)
Prothrombin Time: 13.8 seconds (ref 11.4–15.2)

## 2019-05-19 LAB — ABO/RH: ABO/RH(D): A POS

## 2019-05-19 LAB — APTT: aPTT: 28 seconds (ref 24–36)

## 2019-05-19 NOTE — Progress Notes (Signed)
PCP - Dr. Adalberto Ill @ [facility] Cardiologist - saw Dr. Einar Gip 2012  Chest x-ray - na EKG - 8/20 Stress Test -  ECHO - 2012 Cardiac Cath -  2012 Sleep Study - na CPAP -   Fasting Blood Sugar - > 300 the past couple of weeks, discussed watching diet and taking diabetes medication as prescribed. Pt. Forgot to take insulin last night and also this am. Pt. Reports he's been putting french vanilla sugar in coffee. Checks Blood Sugar __2-3___ times a day  Blood Thinner Instructions: Aspirin Instructions: will continue  Anesthesia review: blood sugars  Patient denies shortness of breath, fever, cough and chest pain at PAT appointment   Patient verbalized understanding of instructions that were given to them at the PAT appointment. Patient was also instructed that they will need to review over the PAT instructions again at home before surgery.

## 2019-05-19 NOTE — Progress Notes (Signed)
[id number] - [place], [place] [address] [place] [address] Phone: [phone] Fax: [phone]      Your procedure is scheduled on August 24th.  Report to [facility] Main Entrance "A" at 7:30 A.M., and check in at the Admitting office.  Call this number if you have problems the morning of surgery:  [phone]  Call [phone] if you have any questions prior to your surgery date Monday-Friday 8am-4pm    Remember:  Do not eat or drink after midnight the night before your surgery   Take these medicines the morning of surgery with A SIP OF WATER   Carvedilol (Coreg)             aspirin  7 days prior to surgery STOP taking  Aleve, Naproxen, Ibuprofen, Motrin, Advil, Goody's, BC's, all herbal medications, fish oil, and all vitamins.   WHAT DO I DO ABOUT MY DIABETES MEDICATION?    Marland Kitchen Do not take oral diabetes medicines (pills) the morning of surgery. - Amaryl  . THE NIGHT BEFORE SURGERY, take  15 units   take PM dose of Lantus.      . THE MORNING OF SURGERY,  take  15 units Lantus.     How to Manage Your Diabetes Before and After Surgery  Why is it important to control my blood sugar before and after surgery? . Improving blood sugar levels before and after surgery helps healing and can limit problems. . A way of improving blood sugar control is eating a healthy diet by: o  Eating less sugar and carbohydrates o  Increasing activity/exercise o  Talking with your doctor about reaching your blood sugar goals . High blood sugars (greater than 180 mg/dL) can raise your risk of infections and slow your recovery, so you will need to focus on controlling your diabetes during the weeks before surgery. . Make sure that the doctor who takes care of your diabetes knows about your planned surgery including the date and location.  How do I manage my blood sugar before surgery? . Check your blood  sugar at least 4 times a day, starting 2 days before surgery, to make sure that the level is not too high or low. o Check your blood sugar the morning of your surgery when you wake up and every 2 hours until you get to the Short Stay unit. . If your blood sugar is less than 70 mg/dL, you will need to treat for low blood sugar: o Do not take insulin. o Treat a low blood sugar (less than 70 mg/dL) with  cup of clear juice (cranberry or apple), 4 glucose tablets, OR glucose gel. o Recheck blood sugar in 15 minutes after treatment (to make sure it is greater than 70 mg/dL). If your blood sugar is not greater than 70 mg/dL on recheck, call [phone] for further instructions. . Report your blood sugar to the short stay nurse when you get to Short Stay.  . If you are admitted to the hospital after surgery: o Your blood sugar will be checked by the staff and you will probably be given insulin after surgery (instead of oral diabetes medicines) to make sure you have good blood sugar levels. o The goal for blood sugar control after surgery is 80-180 mg/dL.      The Morning of Surgery  Do not wear jewelry.  Do not wear lotions, powders,  colognes, or deodorant  Men may shave face and neck.  Do not bring valuables to the hospital.  [facility] is not responsible for any belongings or valuables.  If you are a smoker, DO NOT Smoke 24 hours prior to surgery IF you wear a CPAP at night please bring your mask, tubing, and machine the morning of surgery   Remember that you must have someone to transport you home after your surgery, and remain with you for 24 hours if you are discharged the same day.   Contacts, glasses, hearing aids, dentures or bridgework may not be worn into surgery.    Leave your suitcase in the car.  After surgery it may be brought to your room.  For patients admitted to the hospital, discharge time will be determined by your treatment team.  Patients discharged the day of  surgery will not be allowed to drive home.    Special instructions:   [facility]- Preparing For Surgery  Before surgery, you can play an important role. Because skin is not sterile, your skin needs to be as free of germs as possible. You can reduce the number of germs on your skin by washing with CHG (chlorahexidine gluconate) Soap before surgery.  CHG is an antiseptic cleaner which kills germs and bonds with the skin to continue killing germs even after washing.    Oral Hygiene is also important to reduce your risk of infection.  Remember - BRUSH YOUR TEETH THE MORNING OF SURGERY WITH YOUR REGULAR TOOTHPASTE  Please do not use if you have an allergy to CHG or antibacterial soaps. If your skin becomes reddened/irritated stop using the CHG.  Do not shave (including legs and underarms) for at least 48 hours prior to first CHG shower. It is OK to shave your face.  Please follow these instructions carefully.   1. Shower the NIGHT BEFORE SURGERY and the MORNING OF SURGERY with CHG Soap.   2. If you chose to wash your hair, wash your hair first as usual with your normal shampoo.  3. After you shampoo, rinse your hair and body thoroughly to remove the shampoo.  4. Use CHG as you would any other liquid soap. You can apply CHG directly to the skin and wash gently with a scrungie or a clean washcloth.   5. Apply the CHG Soap to your body ONLY FROM THE NECK DOWN.  Do not use on open wounds or open sores. Avoid contact with your eyes, ears, mouth and genitals (private parts). Wash Face and genitals (private parts)  with your normal soap.   6. Wash thoroughly, paying special attention to the area where your surgery will be performed.  7. Thoroughly rinse your body with warm water from the neck down.  8. DO NOT shower/wash with your normal soap after using and rinsing off the CHG Soap.  9. Pat yourself dry with a CLEAN TOWEL.  10. Wear CLEAN PAJAMAS to bed the night before surgery, wear  comfortable clothes the morning of surgery  11. Place CLEAN SHEETS on your bed the night of your first shower and DO NOT SLEEP WITH PETS.    Day of Surgery:  Do not apply any deodorants/lotions. Please shower the morning of surgery with the CHG soap  Please wear clean clothes to the hospital/surgery center.   Remember to brush your teeth WITH YOUR REGULAR TOOTHPASTE.   Please read over the following fact sheets that you were given.

## 2019-05-19 NOTE — Progress Notes (Signed)
   05/19/19 1040  OBSTRUCTIVE SLEEP APNEA  Have you ever been diagnosed with sleep apnea through a sleep study? No  Do you snore loudly (loud enough to be heard through closed doors)?  1  Do you often feel tired, fatigued, or sleepy during the daytime (such as falling asleep during driving or talking to someone)? 0  Has anyone observed you stop breathing during your sleep? 0  Do you have, or are you being treated for high blood pressure? 1  BMI more than 35 kg/m2? 0  Age > 50 (1-yes) 1  Neck circumference greater than:Male 16 inches or larger, Male 17inches or larger? 1  Male Gender (Yes=1) 1  Obstructive Sleep Apnea Score 5  Score 5 or greater  Results sent to PCP

## 2019-05-20 ENCOUNTER — Encounter (HOSPITAL_COMMUNITY): Payer: Self-pay

## 2019-05-20 NOTE — Anesthesia Preprocedure Evaluation (Addendum)
Anesthesia Evaluation  Patient identified by MRN, date of birth, ID band Patient awake    Reviewed: Allergy & Precautions, NPO status , Patient's Chart, lab work & pertinent test results, reviewed documented beta blocker date and time   History of Anesthesia Complications Negative for: history of anesthetic complications  Airway Mallampati: II  TM Distance: >3 FB Neck ROM: Full    Dental  (+) Upper Dentures, Edentulous Lower   Pulmonary former smoker,   Chronic bronchitis    breath sounds clear to auscultation       Cardiovascular hypertension, Pt. on medications and Pt. on home beta blockers (-) angina+ CAD and + Peripheral Vascular Disease   Rhythm:Regular Rate:Normal     Neuro/Psych negative neurological ROS  negative psych ROS   GI/Hepatic negative GI ROS, Neg liver ROS,   Endo/Other  diabetes, Poorly Controlled, Type 2, Insulin Dependent Obesity   Renal/GU CRFRenal disease     Musculoskeletal negative musculoskeletal ROS (+)   Abdominal   Peds  Hematology  (+) anemia ,  Hodgkin's lymphoma     Anesthesia Other Findings   Reproductive/Obstetrics                          Anesthesia Physical Anesthesia Plan  ASA: III  Anesthesia Plan: General   Post-op Pain Management:    Induction: Intravenous  PONV Risk Score and Plan: 2 and Treatment may vary due to age or medical condition, Ondansetron and Dexamethasone  Airway Management Planned: Oral ETT  Additional Equipment: None  Intra-op Plan:   Post-operative Plan: Extubation in OR  Informed Consent: I have reviewed the patients History and Physical, chart, labs and discussed the procedure including the risks, benefits and alternatives for the proposed anesthesia with the patient or authorized representative who has indicated his/her understanding and acceptance.     Dental advisory given  Plan Discussed with: CRNA and  Anesthesiologist  Anesthesia Plan Comments:      Anesthesia Quick Evaluation

## 2019-05-21 ENCOUNTER — Other Ambulatory Visit: Payer: Self-pay

## 2019-05-21 ENCOUNTER — Other Ambulatory Visit (HOSPITAL_COMMUNITY)
Admission: RE | Admit: 2019-05-21 | Discharge: 2019-05-21 | Disposition: A | Discharge status: Home / Self Care | Payer: Medicare Other | Source: Ambulatory Visit | Attending: Vascular Surgery | Admitting: Vascular Surgery

## 2019-05-21 DIAGNOSIS — Z01812 Encounter for preprocedural laboratory examination: Secondary | ICD-10-CM | POA: Diagnosis not present

## 2019-05-21 LAB — SARS CORONAVIRUS 2 (TAT 6-24 HRS): SARS Coronavirus 2: NEGATIVE

## 2019-05-24 ENCOUNTER — Inpatient Hospital Stay (HOSPITAL_COMMUNITY): Payer: Medicare Other | Admitting: Anesthesiology

## 2019-05-24 ENCOUNTER — Inpatient Hospital Stay (HOSPITAL_COMMUNITY)
Admission: RE | Admit: 2019-05-24 | Discharge: 2019-05-25 | DRG: 254 | Disposition: A | Discharge status: Home / Self Care | Payer: Medicare Other | Attending: Vascular Surgery | Admitting: Vascular Surgery

## 2019-05-24 ENCOUNTER — Other Ambulatory Visit: Payer: Self-pay

## 2019-05-24 ENCOUNTER — Encounter (HOSPITAL_COMMUNITY): Payer: Self-pay

## 2019-05-24 ENCOUNTER — Inpatient Hospital Stay (HOSPITAL_COMMUNITY): Payer: Medicare Other | Admitting: Physician Assistant

## 2019-05-24 ENCOUNTER — Encounter (HOSPITAL_COMMUNITY)
Admission: RE | Disposition: A | Discharge status: Home / Self Care | Payer: Self-pay | Source: Home / Self Care | Attending: Vascular Surgery

## 2019-05-24 DIAGNOSIS — E669 Obesity, unspecified: Secondary | ICD-10-CM | POA: Diagnosis present

## 2019-05-24 DIAGNOSIS — I10 Essential (primary) hypertension: Secondary | ICD-10-CM | POA: Diagnosis present

## 2019-05-24 DIAGNOSIS — Z6833 Body mass index (BMI) 33.0-33.9, adult: Secondary | ICD-10-CM

## 2019-05-24 DIAGNOSIS — I2510 Atherosclerotic heart disease of native coronary artery without angina pectoris: Secondary | ICD-10-CM | POA: Diagnosis present

## 2019-05-24 DIAGNOSIS — E1151 Type 2 diabetes mellitus with diabetic peripheral angiopathy without gangrene: Principal | ICD-10-CM | POA: Diagnosis present

## 2019-05-24 DIAGNOSIS — Z794 Long term (current) use of insulin: Secondary | ICD-10-CM

## 2019-05-24 DIAGNOSIS — E785 Hyperlipidemia, unspecified: Secondary | ICD-10-CM | POA: Diagnosis present

## 2019-05-24 DIAGNOSIS — I739 Peripheral vascular disease, unspecified: Secondary | ICD-10-CM | POA: Diagnosis present

## 2019-05-24 DIAGNOSIS — Z8571 Personal history of Hodgkin lymphoma: Secondary | ICD-10-CM | POA: Diagnosis not present

## 2019-05-24 DIAGNOSIS — F17220 Nicotine dependence, chewing tobacco, uncomplicated: Secondary | ICD-10-CM | POA: Diagnosis present

## 2019-05-24 DIAGNOSIS — I70212 Atherosclerosis of native arteries of extremities with intermittent claudication, left leg: Secondary | ICD-10-CM | POA: Diagnosis present

## 2019-05-24 DIAGNOSIS — I70211 Atherosclerosis of native arteries of extremities with intermittent claudication, right leg: Secondary | ICD-10-CM

## 2019-05-24 HISTORY — PX: ENDARTERECTOMY FEMORAL: SHX5804

## 2019-05-24 HISTORY — PX: PATCH ANGIOPLASTY: SHX6230

## 2019-05-24 LAB — GLUCOSE, CAPILLARY
Glucose-Capillary: 116 mg/dL — ABNORMAL HIGH (ref 70–99)
Glucose-Capillary: 108 mg/dL — ABNORMAL HIGH (ref 70–99)
Glucose-Capillary: 61 mg/dL — ABNORMAL LOW (ref 70–99)
Glucose-Capillary: 53 mg/dL — ABNORMAL LOW (ref 70–99)
Glucose-Capillary: 88 mg/dL (ref 70–99)
Glucose-Capillary: 96 mg/dL (ref 70–99)
Glucose-Capillary: 149 mg/dL — ABNORMAL HIGH (ref 70–99)

## 2019-05-24 LAB — CBC
HCT: 37.8 % — ABNORMAL LOW (ref 39.0–52.0)
Hemoglobin: 11.9 g/dL — ABNORMAL LOW (ref 13.0–17.0)
MCH: 26.4 pg (ref 26.0–34.0)
MCHC: 31.5 g/dL (ref 30.0–36.0)
MCV: 84 fL (ref 80.0–100.0)
Platelets: 133 10*3/uL — ABNORMAL LOW (ref 150–400)
RBC: 4.5 MIL/uL (ref 4.22–5.81)
RDW: 12.9 % (ref 11.5–15.5)
WBC: 5.6 10*3/uL (ref 4.0–10.5)
nRBC: 0 % (ref 0.0–0.2)

## 2019-05-24 LAB — CREATININE, SERUM
Creatinine, Ser: 1.48 mg/dL — ABNORMAL HIGH (ref 0.61–1.24)
GFR calc Af Amer: 53 mL/min — ABNORMAL LOW
GFR calc non Af Amer: 46 mL/min — ABNORMAL LOW

## 2019-05-24 SURGERY — ENDARTERECTOMY, FEMORAL
Anesthesia: General | Site: Groin | Laterality: Left

## 2019-05-24 MED ORDER — GLYCOPYRROLATE PF 0.2 MG/ML IJ SOSY
PREFILLED_SYRINGE | INTRAMUSCULAR | Status: DC | PRN
  Administered 2019-05-24: .2 mg via INTRAVENOUS

## 2019-05-24 MED ORDER — PROPOFOL 10 MG/ML IV BOLUS
INTRAVENOUS | Status: AC
  Filled 2019-05-24: qty 40

## 2019-05-24 MED ORDER — ACETAMINOPHEN 325 MG RE SUPP: 325.0000 mg | RECTAL | Status: DC | PRN

## 2019-05-24 MED ORDER — CHLORHEXIDINE GLUCONATE CLOTH 2 % EX PADS: 6.0000 | MEDICATED_PAD | Freq: Once | CUTANEOUS | Status: DC

## 2019-05-24 MED ORDER — PROPOFOL 10 MG/ML IV BOLUS
INTRAVENOUS | Status: DC | PRN
  Administered 2019-05-24: 150 mg via INTRAVENOUS

## 2019-05-24 MED ORDER — DOCUSATE SODIUM 100 MG PO CAPS
100.0000 mg | ORAL_CAPSULE | Freq: Every day | ORAL | Status: DC
  Administered 2019-05-25: 100 mg via ORAL
  Filled 2019-05-24: qty 1

## 2019-05-24 MED ORDER — ACETAMINOPHEN 325 MG PO TABS: 325.0000 mg | ORAL_TABLET | ORAL | Status: DC | PRN

## 2019-05-24 MED ORDER — PANTOPRAZOLE SODIUM 40 MG PO TBEC
40.0000 mg | DELAYED_RELEASE_TABLET | Freq: Every day | ORAL | Status: DC
  Administered 2019-05-24 – 2019-05-25 (×2): 40 mg via ORAL
  Filled 2019-05-24 (×2): qty 1

## 2019-05-24 MED ORDER — CEFAZOLIN SODIUM-DEXTROSE 2-4 GM/100ML-% IV SOLN
2.0000 g | Freq: Three times a day (TID) | INTRAVENOUS | Status: AC
  Administered 2019-05-24 – 2019-05-25 (×2): 2 g via INTRAVENOUS
  Filled 2019-05-24 (×2): qty 100

## 2019-05-24 MED ORDER — ONDANSETRON HCL 4 MG/2ML IJ SOLN
INTRAMUSCULAR | Status: DC | PRN
  Administered 2019-05-24: 4 mg via INTRAVENOUS

## 2019-05-24 MED ORDER — MAGNESIUM SULFATE 2 GM/50ML IV SOLN: 2.0000 g | Freq: Every day | INTRAVENOUS | Status: DC | PRN

## 2019-05-24 MED ORDER — EPHEDRINE 5 MG/ML INJ
INTRAVENOUS | Status: AC
  Filled 2019-05-24: qty 10

## 2019-05-24 MED ORDER — OXYCODONE-ACETAMINOPHEN 5-325 MG PO TABS: 1.0000 | ORAL_TABLET | ORAL | Status: DC | PRN

## 2019-05-24 MED ORDER — DEXTROSE 50 % IV SOLN
INTRAVENOUS | Status: AC
  Filled 2019-05-24: qty 50

## 2019-05-24 MED ORDER — POLYETHYLENE GLYCOL 3350 17 G PO PACK: 17.0000 g | PACK | Freq: Every day | ORAL | Status: DC | PRN

## 2019-05-24 MED ORDER — CEFAZOLIN SODIUM-DEXTROSE 2-4 GM/100ML-% IV SOLN
2.0000 g | INTRAVENOUS | Status: AC
  Administered 2019-05-24: 2 g via INTRAVENOUS

## 2019-05-24 MED ORDER — EPHEDRINE SULFATE 50 MG/ML IJ SOLN
INTRAMUSCULAR | Status: DC | PRN
  Administered 2019-05-24 (×2): 10 mg via INTRAVENOUS
  Administered 2019-05-24: 5 mg via INTRAVENOUS

## 2019-05-24 MED ORDER — PRAVASTATIN SODIUM 40 MG PO TABS
40.0000 mg | ORAL_TABLET | Freq: Every day | ORAL | Status: DC
  Administered 2019-05-24: 40 mg via ORAL
  Filled 2019-05-24: qty 1

## 2019-05-24 MED ORDER — PROPOFOL 500 MG/50ML IV EMUL
INTRAVENOUS | Status: AC
  Filled 2019-05-24: qty 100

## 2019-05-24 MED ORDER — SODIUM CHLORIDE 0.9 % IV SOLN: INTRAVENOUS | Status: DC

## 2019-05-24 MED ORDER — ASPIRIN EC 81 MG PO TBEC
81.0000 mg | DELAYED_RELEASE_TABLET | Freq: Every day | ORAL | Status: DC
  Administered 2019-05-24 – 2019-05-25 (×2): 81 mg via ORAL
  Filled 2019-05-24 (×2): qty 1

## 2019-05-24 MED ORDER — OXYCODONE HCL 5 MG PO TABS
5.0000 mg | ORAL_TABLET | Freq: Once | ORAL | Status: AC | PRN
  Administered 2019-05-24: 5 mg via ORAL

## 2019-05-24 MED ORDER — ONDANSETRON HCL 4 MG/2ML IJ SOLN
INTRAMUSCULAR | Status: AC
  Filled 2019-05-24: qty 2

## 2019-05-24 MED ORDER — HEPARIN SODIUM (PORCINE) 5000 UNIT/ML IJ SOLN
5000.0000 [IU] | Freq: Three times a day (TID) | INTRAMUSCULAR | Status: DC
  Administered 2019-05-25: 5000 [IU] via SUBCUTANEOUS
  Filled 2019-05-24: qty 1

## 2019-05-24 MED ORDER — INSULIN GLARGINE 100 UNIT/ML SOLOSTAR PEN: 30.0000 [IU] | PEN_INJECTOR | Freq: Two times a day (BID) | SUBCUTANEOUS | Status: DC

## 2019-05-24 MED ORDER — LABETALOL HCL 5 MG/ML IV SOLN: 10.0000 mg | INTRAVENOUS | Status: DC | PRN

## 2019-05-24 MED ORDER — ROCURONIUM BROMIDE 50 MG/5ML IV SOSY
PREFILLED_SYRINGE | INTRAVENOUS | Status: DC | PRN
  Administered 2019-05-24: 50 mg via INTRAVENOUS

## 2019-05-24 MED ORDER — ALUM & MAG HYDROXIDE-SIMETH 200-200-20 MG/5ML PO SUSP: 15.0000 mL | ORAL | Status: DC | PRN

## 2019-05-24 MED ORDER — LACTATED RINGERS IV SOLN
INTRAVENOUS | Status: DC
  Administered 2019-05-24 (×2): via INTRAVENOUS

## 2019-05-24 MED ORDER — LIDOCAINE 2% (20 MG/ML) 5 ML SYRINGE
INTRAMUSCULAR | Status: DC | PRN
  Administered 2019-05-24: 60 mg via INTRAVENOUS

## 2019-05-24 MED ORDER — CARVEDILOL 6.25 MG PO TABS
6.2500 mg | ORAL_TABLET | Freq: Two times a day (BID) | ORAL | Status: DC
  Administered 2019-05-24 – 2019-05-25 (×2): 6.25 mg via ORAL
  Filled 2019-05-24 (×2): qty 1

## 2019-05-24 MED ORDER — 0.9 % SODIUM CHLORIDE (POUR BTL) OPTIME
TOPICAL | Status: DC | PRN
  Administered 2019-05-24 (×3): 1000 mL

## 2019-05-24 MED ORDER — ONDANSETRON HCL 4 MG/2ML IJ SOLN: 4.0000 mg | Freq: Once | INTRAMUSCULAR | Status: DC | PRN

## 2019-05-24 MED ORDER — INSULIN GLARGINE 100 UNIT/ML ~~LOC~~ SOLN
30.0000 [IU] | Freq: Two times a day (BID) | SUBCUTANEOUS | Status: DC
  Administered 2019-05-24: 20 [IU] via SUBCUTANEOUS
  Filled 2019-05-24 (×3): qty 0.3

## 2019-05-24 MED ORDER — GUAIFENESIN-DM 100-10 MG/5ML PO SYRP: 15.0000 mL | ORAL_SOLUTION | ORAL | Status: DC | PRN

## 2019-05-24 MED ORDER — PHENYLEPHRINE HCL-NACL 10-0.9 MG/250ML-% IV SOLN
INTRAVENOUS | Status: AC
  Filled 2019-05-24: qty 500

## 2019-05-24 MED ORDER — SODIUM CHLORIDE 0.9 % IV SOLN
INTRAVENOUS | Status: DC | PRN
  Administered 2019-05-24: 35 ug/min via INTRAVENOUS

## 2019-05-24 MED ORDER — FENTANYL CITRATE (PF) 100 MCG/2ML IJ SOLN
INTRAMUSCULAR | Status: DC | PRN
  Administered 2019-05-24 (×2): 50 ug via INTRAVENOUS

## 2019-05-24 MED ORDER — SODIUM CHLORIDE 0.9 % IV SOLN: 500.0000 mL | Freq: Once | INTRAVENOUS | Status: DC | PRN

## 2019-05-24 MED ORDER — MORPHINE SULFATE (PF) 2 MG/ML IV SOLN: 2.0000 mg | INTRAVENOUS | Status: DC | PRN

## 2019-05-24 MED ORDER — GLIMEPIRIDE 4 MG PO TABS: 8.0000 mg | ORAL_TABLET | Freq: Every day | ORAL | Status: DC

## 2019-05-24 MED ORDER — PROPOFOL 1000 MG/100ML IV EMUL
INTRAVENOUS | Status: AC
  Filled 2019-05-24: qty 200

## 2019-05-24 MED ORDER — HEMOSTATIC AGENTS (NO CHARGE) OPTIME
TOPICAL | Status: DC | PRN
  Administered 2019-05-24: 1 via TOPICAL

## 2019-05-24 MED ORDER — PHENOL 1.4 % MT LIQD: 1.0000 | OROMUCOSAL | Status: DC | PRN

## 2019-05-24 MED ORDER — DEXTROSE 50 % IV SOLN
25.0000 mL | Freq: Once | INTRAVENOUS | Status: AC
  Administered 2019-05-24: 25 mL via INTRAVENOUS

## 2019-05-24 MED ORDER — POTASSIUM CHLORIDE CRYS ER 20 MEQ PO TBCR: 20.0000 meq | EXTENDED_RELEASE_TABLET | Freq: Every day | ORAL | Status: DC | PRN

## 2019-05-24 MED ORDER — FENTANYL CITRATE (PF) 250 MCG/5ML IJ SOLN
INTRAMUSCULAR | Status: AC
  Filled 2019-05-24: qty 5

## 2019-05-24 MED ORDER — LOSARTAN POTASSIUM 50 MG PO TABS
50.0000 mg | ORAL_TABLET | Freq: Every day | ORAL | Status: DC
  Administered 2019-05-25: 50 mg via ORAL
  Filled 2019-05-24: qty 1

## 2019-05-24 MED ORDER — OXYCODONE HCL 5 MG/5ML PO SOLN: 5.0000 mg | Freq: Once | ORAL | Status: AC | PRN

## 2019-05-24 MED ORDER — METOPROLOL TARTRATE 5 MG/5ML IV SOLN: 2.0000 mg | INTRAVENOUS | Status: DC | PRN

## 2019-05-24 MED ORDER — SUGAMMADEX SODIUM 200 MG/2ML IV SOLN
INTRAVENOUS | Status: DC | PRN
  Administered 2019-05-24: 200 mg via INTRAVENOUS

## 2019-05-24 MED ORDER — OXYCODONE HCL 5 MG PO TABS
ORAL_TABLET | ORAL | Status: AC
  Filled 2019-05-24: qty 1

## 2019-05-24 MED ORDER — FENTANYL CITRATE (PF) 100 MCG/2ML IJ SOLN: 25.0000 ug | INTRAMUSCULAR | Status: DC | PRN

## 2019-05-24 MED ORDER — ROCURONIUM BROMIDE 10 MG/ML (PF) SYRINGE
PREFILLED_SYRINGE | INTRAVENOUS | Status: AC
  Filled 2019-05-24: qty 10

## 2019-05-24 MED ORDER — SODIUM CHLORIDE 0.9 % IV SOLN
INTRAVENOUS | Status: DC | PRN
  Administered 2019-05-24: 500 mL

## 2019-05-24 MED ORDER — CEFAZOLIN SODIUM-DEXTROSE 2-4 GM/100ML-% IV SOLN
INTRAVENOUS | Status: AC
  Filled 2019-05-24: qty 100

## 2019-05-24 MED ORDER — LIDOCAINE 2% (20 MG/ML) 5 ML SYRINGE
INTRAMUSCULAR | Status: AC
  Filled 2019-05-24: qty 5

## 2019-05-24 MED ORDER — HEPARIN SODIUM (PORCINE) 1000 UNIT/ML IJ SOLN
INTRAMUSCULAR | Status: DC | PRN
  Administered 2019-05-24: 10000 [IU] via INTRAVENOUS

## 2019-05-24 MED ORDER — MIDAZOLAM HCL 2 MG/2ML IJ SOLN
INTRAMUSCULAR | Status: AC
  Filled 2019-05-24: qty 2

## 2019-05-24 MED ORDER — HYDRALAZINE HCL 20 MG/ML IJ SOLN: 5.0000 mg | INTRAMUSCULAR | Status: DC | PRN

## 2019-05-24 MED ORDER — ONDANSETRON HCL 4 MG/2ML IJ SOLN: 4.0000 mg | Freq: Four times a day (QID) | INTRAMUSCULAR | Status: DC | PRN

## 2019-05-24 MED ORDER — PROTAMINE SULFATE 10 MG/ML IV SOLN
INTRAVENOUS | Status: DC | PRN
  Administered 2019-05-24: 40 mg via INTRAVENOUS

## 2019-05-24 SURGICAL SUPPLY — 44 items
ADH SKN CLS APL DERMABOND .7 (GAUZE/BANDAGES/DRESSINGS) ×1
AGENT HMST SPONGE THK3/8 (HEMOSTASIS)
CANISTER SUCT 3000ML PPV (MISCELLANEOUS) ×2 IMPLANT
CLIP VESOCCLUDE MED 24/CT (CLIP) ×2 IMPLANT
CLIP VESOCCLUDE SM WIDE 24/CT (CLIP) ×2 IMPLANT
COVER WAND RF STERILE (DRAPES) ×1 IMPLANT
DERMABOND ADVANCED (GAUZE/BANDAGES/DRESSINGS) ×1
DERMABOND ADVANCED .7 DNX12 (GAUZE/BANDAGES/DRESSINGS) ×1 IMPLANT
DRAIN CHANNEL 15F RND FF W/TCR (WOUND CARE) IMPLANT
ELECT CAUTERY BLADE 6.4 (BLADE) ×1 IMPLANT
ELECT REM PT RETURN 9FT ADLT (ELECTROSURGICAL) ×2
ELECTRODE REM PT RTRN 9FT ADLT (ELECTROSURGICAL) ×1 IMPLANT
EVACUATOR SILICONE 100CC (DRAIN) IMPLANT
GLOVE BIO SURGEON STRL SZ 6.5 (GLOVE) ×2 IMPLANT
GLOVE BIO SURGEON STRL SZ7.5 (GLOVE) ×3 IMPLANT
GLOVE BIOGEL PI IND STRL 6.5 (GLOVE) IMPLANT
GLOVE BIOGEL PI IND STRL 8 (GLOVE) ×1 IMPLANT
GLOVE BIOGEL PI INDICATOR 6.5 (GLOVE) ×1
GLOVE BIOGEL PI INDICATOR 8 (GLOVE) ×1
GOWN STRL REUS W/ TWL LRG LVL3 (GOWN DISPOSABLE) ×3 IMPLANT
GOWN STRL REUS W/ TWL XL LVL3 (GOWN DISPOSABLE) ×2 IMPLANT
GOWN STRL REUS W/TWL LRG LVL3 (GOWN DISPOSABLE) ×2
GOWN STRL REUS W/TWL XL LVL3 (GOWN DISPOSABLE) ×6
HEMOSTAT SNOW SURGICEL 2X4 (HEMOSTASIS) ×1 IMPLANT
HEMOSTAT SPONGE AVITENE ULTRA (HEMOSTASIS) IMPLANT
KIT BASIN OR (CUSTOM PROCEDURE TRAY) ×2 IMPLANT
KIT TURNOVER KIT B (KITS) ×2 IMPLANT
NS IRRIG 1000ML POUR BTL (IV SOLUTION) ×5 IMPLANT
PACK PERIPHERAL VASCULAR (CUSTOM PROCEDURE TRAY) ×2 IMPLANT
PAD ARMBOARD 7.5X6 YLW CONV (MISCELLANEOUS) ×4 IMPLANT
PATCH VASC XENOSURE 1CMX6CM (Vascular Products) ×2 IMPLANT
PATCH VASC XENOSURE 1X6 (Vascular Products) IMPLANT
PENCIL BUTTON HOLSTER BLD 10FT (ELECTRODE) ×1 IMPLANT
SUT MNCRL AB 4-0 PS2 18 (SUTURE) ×2 IMPLANT
SUT PROLENE 5 0 C 1 24 (SUTURE) ×3 IMPLANT
SUT PROLENE 6 0 BV (SUTURE) ×6 IMPLANT
SUT VIC AB 2-0 CT1 27 (SUTURE) ×4
SUT VIC AB 2-0 CT1 TAPERPNT 27 (SUTURE) ×1 IMPLANT
SUT VIC AB 3-0 SH 27 (SUTURE) ×2
SUT VIC AB 3-0 SH 27X BRD (SUTURE) ×1 IMPLANT
TOWEL GREEN STERILE (TOWEL DISPOSABLE) ×2 IMPLANT
TRAY FOLEY MTR SLVR 16FR STAT (SET/KITS/TRAYS/PACK) IMPLANT
UNDERPAD 30X30 (UNDERPADS AND DIAPERS) ×1 IMPLANT
WATER STERILE IRR 1000ML POUR (IV SOLUTION) ×2 IMPLANT

## 2019-05-24 NOTE — Anesthesia Postprocedure Evaluation (Signed)
Anesthesia Post Note  Patient: Cody Wood  Procedure(s) Performed: ENDARTERECTOMY FEMORAL (Left Groin) Patch Angioplasty Superficial Femoral Artery (Left Groin)     Patient location during evaluation: PACU Anesthesia Type: General Level of consciousness: awake and alert Pain management: pain level controlled Vital Signs Assessment: post-procedure vital signs reviewed and stable Respiratory status: spontaneous breathing, nonlabored ventilation and respiratory function stable Cardiovascular status: blood pressure returned to baseline, stable and bradycardic Postop Assessment: no apparent nausea or vomiting Anesthetic complications: no    Last Vitals:  Vitals:   05/24/19 1336 05/24/19 1340  BP: (!) 143/63   Pulse: (!) 50 (!) 48  Resp: 16 12  Temp:    SpO2: 99% 100%    Last Pain:  Vitals:   05/24/19 1235  PainSc: 0-No pain                 Audry Pili

## 2019-05-24 NOTE — Op Note (Signed)
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 [name], MD   Assistant: Arlee Muslim, PA  Indications: Patient is a 73 year old male who was seen in clinic for lifestyle limiting short distance claudication of the left lower extremity.  Ultimately underwent aortogram of his left lower extremity last week that showed near occlusion of his proximal SFA at the ostium with plaque into the common femoral and sluggish flow down the left leg.  He presents today for left common femoral endarterectomy with bovine patch angioplasty with plans to endarterectomize onto the SFA.  Risk benefits have been discussed with the patient.  Findings: There was a large posterior plaque in the common femoral artery with plaque in the ostium of both profunda branches.  The proximal SFA appeared occluded with no retrograde backbleeding.  Endarterectomy was then performed of each profunda branch with eversion technique and the arteriotomy was carried onto the SFA with patch angioplasty of the SFA after endarterectomy onto the SFA.  Patient had brisk dorsalis pedis and posterior tibial signals at completion of the case.  Anesthesia: General  Details: The patient was taken to the operating room after informed consent was obtained.  Placed on operative table in supine position.  General endotracheal anesthesia was induced.  His left groin and thigh were then prepped and draped usual sterile fashion.  A prep timeout was performed to identify patient, procedure and site.  Initially made vertical groin incision in the left groin using 15 blade scalpel.  Dissected down with Bovie cautery to the common femoral artery that was isolated and we carried our dissection distally until the SFA and profunda  branches were identified.  I did have to extend my incision given he had a large plaque in the proximal SFA and the need to find a soft endpoint in the SFA for clamping.  Ultimately the SFA was mobilized about 4 to 5 cm distally and controlled with a vessel loop.  The profunda branches were then dissected out controlled with Vesseloops.  I dissected under the inguinal ligament in order to find a nice soft place to place a clamp.  Patient was then given 100 units/kg heparin.  I placed a baby profunda clamp on the SFA distal to the plaque at the ostium.  The profunda branches were controlled with Vesseloops.  The external iliac was controlled with a Henley clamp.  I opened the common femoral with 11 blade scalpel extended with Potts scissors onto the SFA.  The proximal SFA appeared occluded with no backbleeding.  I continued my arteriotomy distally onto the SFA until I found the true lumen of the SFA and the artery was healthy.  I then came up to the proximal portion of the common femoral artery and using a Garment/textile technologist developed the plane for endarterectomy truncated the plaque with scissors.  Ultimately I carried this plaque all the way down using a Penfield elevator and both profunda branches were endarterectomized with eversion technique given that the artery was very soft just distal to the ostium.  I then carried my endarterectomy down to the SFA for approximately 3 to 4 cm and then the plaque was truncated with scissors.  The occlusive plaque was completely removed.  I then used several 6-0 Prolene tacking sutures to tack down the flap in the SFA to ensure a good  endpoint.  At that point time a bovine pericardial patch was brought on the field.  It was trimmed accordingly and then sewn to the common femoral artery onto the SFA with parachute technique.  Once we completed we de-aired everything prior to completion of the patch.  Excellent signals in both profunda as well as the SFA.  Patient good signals in  the left foot.  40 mg of protamine was given for reversal Surgicel snow was used for hemostasis.  The groin was washed out until the effluent was clear.  I closed multiple layers with 2-0 Vicryl 3-0 Vicryl 4-0 Monocryl in the skin and Dermabond applied.  Complication: None  Condition: Stable  [name], MD [facility] Office: [phone] Pager: [place]

## 2019-05-24 NOTE — H&P (Signed)
History and Physical Interval Note:  05/24/2019 9:44 AM  [name]  has presented today for surgery, with the diagnosis of left leg claudication.  The various methods of treatment have been discussed with the patient and family. After consideration of risks, benefits and other options for treatment, the patient has consented to  Procedure(s): ENDARTERECTOMY FEMORAL (Left) as a surgical intervention.  The patient's history has been reviewed, patient examined, no change in status, stable for surgery.  I have reviewed the patient's chart and labs.  Questions were answered to the patient's satisfaction.    Left femoral endarterectomy - short distance lifestyle limiting claudication  [name]  Patient name: Cody Wood          MRN: [id number]        DOB: Jun 27, 1946          Sex: male  REASON FOR CONSULT: Abnormal ABIs  HPI: Cody Wood is a 73 y.o. male, with history of coronary artery disease, hypertension, hyperlipidemia, diabetes, Hodgkins that presents for evaluation of PAD and abnormal ABIs.  Patient states he has had trouble with his left leg for several years.  His initial complaints include swelling as well as pain in his calf whenever he is walking.  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  Ultimately the symptoms have progressed over the last couple years.  He states now can only walk about 150 feet to his mailbox before he has to stop with severe pain in his left calf.  The pain always resolves when he stops.  No rest pain at night.  No tissue loss.  No previous lower extremity interventions.  States he quit smoking in 2005 but does chew tobacco.      Past Medical History:  Diagnosis Date  . Blockage of coronary artery of heart ([place])   . Diabetes mellitus    metformin  . hodgkins lymphoma dx'd 06/2008  . Hypertension          Past Surgical History:   Procedure Laterality Date  . KNEE SURGERY    . LEFT HEART CATHETERIZATION WITH CORONARY ANGIOGRAM N/A 09/03/2011   Procedure: LEFT HEART CATHETERIZATION WITH CORONARY ANGIOGRAM;  Surgeon: Laverda Page, MD;  Location: [facility] CATH LAB;  Service: Cardiovascular;  Laterality: N/A;  . MANDIBLE SURGERY    . NECK SURGERY      No family history on file.  SOCIAL HISTORY: Social History        Socioeconomic History  . Marital status: Divorced    Spouse name: Not on file  . Number of children: Not on file  . Years of education: Not on file  . Highest education level: Not on file  Occupational History  . Not on file  Social Needs  . Financial resource strain: Not on file  . Food insecurity    Worry: Not on file    Inability: Not on file  . Transportation needs    Medical: Not on file    Non-medical: Not on file  Tobacco Use  . Smoking status: Former Smoker    Quit date: 11/05/1985    Years since quitting: 33.5  . Smokeless tobacco: Current User    Types: Chew  Substance and Sexual Activity  . Alcohol use: No  . Drug use: No  . Sexual activity: Not on file  Lifestyle  . Physical activity  Days per week: Not on file    Minutes per session: Not on file  . Stress: Not on file  Relationships  . Social Herbalist on phone: Not on file    Gets together: Not on file    Attends religious service: Not on file    Active member of club or organization: Not on file    Attends meetings of clubs or organizations: Not on file    Relationship status: Not on file  . Intimate partner violence    Fear of current or ex partner: Not on file    Emotionally abused: Not on file    Physically abused: Not on file    Forced sexual activity: Not on file  Other Topics Concern  . Not on file  Social History Narrative  . Not on file    No Known Allergies        Current Outpatient Medications  Medication Sig Dispense Refill  .  aspirin 81 MG EC tablet Take 325 mg by mouth daily.    . carvedilol (COREG) 3.125 MG tablet Take 3.125 mg by mouth 2 (two) times daily with a meal.      . LANTUS SOLOSTAR 100 UNIT/ML Solostar Pen Inject 30 Units into the skin at bedtime.  0  . losartan (COZAAR) 50 MG tablet Take 50 mg by mouth daily.    Marland Kitchen lovastatin (MEVACOR) 20 MG tablet Take 20 mg by mouth every morning.      No current facility-administered medications for this visit.     REVIEW OF SYSTEMS:  [X]  denotes positive finding, [ ]  denotes negative finding Cardiac  Comments:  Chest pain or chest pressure:    Shortness of breath upon exertion:    Short of breath when lying flat:    Irregular heart rhythm:        Vascular    Pain in calf, thigh, or hip brought on by ambulation: x Left leg  Pain in feet at night that wakes you up from your sleep:     Blood clot in your veins:    Leg swelling:         Pulmonary    Oxygen at home:    Productive cough:     Wheezing:         Neurologic    Sudden weakness in arms or legs:     Sudden numbness in arms or legs:     Sudden onset of difficulty speaking or slurred speech:    Temporary loss of vision in one eye:     Problems with dizziness:         Gastrointestinal    Blood in stool:     Vomited blood:         Genitourinary    Burning when urinating:     Blood in urine:        Psychiatric    Major depression:         Hematologic    Bleeding problems:    Problems with blood clotting too easily:        Skin    Rashes or ulcers:        Constitutional    Fever or chills:      PHYSICAL EXAM:    Vitals:   05/04/19 1155  BP: (!) 149/69  Pulse: 61  Resp: 12  Temp: 97.6 F (36.4 C)  TempSrc: Temporal  SpO2: 98%  Weight: 221 lb 11.2 oz (100.6 kg)  Height:  5\' 8"  (1.727 m)    GENERAL: The patient is a well-nourished male, in no acute distress. The  vital signs are documented above. CARDIAC: There is a regular rate and rhythm.  VASCULAR:  2+ palpable femoral pulses bilaterally 1+ right DP palpable No palpable left pedal pulses No lower extremity tissue loss PULMONARY: There is good air exchange bilaterally without wheezing or rales. ABDOMEN: Soft and non-tender with normal pitched bowel sounds.  MUSCULOSKELETAL: There are no major deformities or cyanosis. NEUROLOGIC: No focal weakness or paresthesias are detected. SKIN: There are no ulcers or rashes noted. PSYCHIATRIC: The patient has a normal affect.  DATA:   ABIs are noncompressible in the right with a triphasic waveform 0.91 on the left with a monophasic waveform (like inaccurate ABI given right non-compressible).  Toe pressure on the left is severely reduced at 46.  Assessment/Plan:  73 year old male the presents with peripheral arterial disease and symptoms of short distance lifestyle limiting claudication of the left lower extremity.  He does have a monophasic waveform at the left ankle with a severely reduced toe pressure of 46.  His ABI is like inaccurate.  His symptoms are still consistent with claudication and he can only walk about 150 feet now and has become more debilitated over the last several years.  Discussed that claudication is not typically considered a limb threatening situation.  That being said patient states he would like something done to try and improve his quality of life.  We will plan for left lower extremity arteriogram possible intervention at next available date and all risks and benefits were discussed in detail.   [name], MD [facility] [place] Office: [phone] Pager: [phone]

## 2019-05-24 NOTE — Progress Notes (Signed)
   S/P Left common femoral and SFA endarterectomy with bovine pericardial patch angioplasty  Left groin soft Palpable DP pulse  CC: lumbar pain due to bed positioning. Stable waiting on 4E bed.  Roxy Horseman PA-C

## 2019-05-24 NOTE — Plan of Care (Signed)
  Problem: Clinical Measurements: Goal: Will remain free from infection Outcome: Progressing Goal: Respiratory complications will improve Outcome: Progressing   Problem: Coping: Goal: Level of anxiety will decrease Outcome: Progressing   

## 2019-05-24 NOTE — Transfer of Care (Signed)
Immediate Anesthesia Transfer of Care Note  Patient: Cody Wood  Procedure(s) Performed: ENDARTERECTOMY FEMORAL (Left Groin) Patch Angioplasty Superficial Femoral Artery (Left Groin)  Patient Location: PACU  Anesthesia Type:General  Level of Consciousness: awake, alert , oriented and sedated  Airway & Oxygen Therapy: Patient Spontanous Breathing and Patient connected to nasal cannula oxygen  Post-op Assessment: Report given to RN, Post -op Vital signs reviewed and stable and Patient moving all extremities  Post vital signs: Reviewed and stable  Last Vitals:  Vitals Value Taken Time  BP 124/57 05/24/19 1235  Temp    Pulse 52 05/24/19 1241  Resp 14 05/24/19 1241  SpO2 100 % 05/24/19 1241  Vitals shown include unvalidated device data.  Last Pain:  Vitals:   05/24/19 0735  PainSc: 3       Patients Stated Pain Goal: 2 ([id number])  Complications: No apparent anesthesia complications

## 2019-05-24 NOTE — Anesthesia Procedure Notes (Signed)

## 2019-05-25 ENCOUNTER — Encounter (HOSPITAL_COMMUNITY): Payer: Self-pay | Admitting: Vascular Surgery

## 2019-05-25 LAB — BASIC METABOLIC PANEL
Anion gap: 10 (ref 5–15)
BUN: 20 mg/dL (ref 8–23)
CO2: 24 mmol/L (ref 22–32)
Calcium: 8.8 mg/dL — ABNORMAL LOW (ref 8.9–10.3)
Chloride: 103 mmol/L (ref 98–111)
Creatinine, Ser: 1.46 mg/dL — ABNORMAL HIGH (ref 0.61–1.24)
GFR calc Af Amer: 54 mL/min — ABNORMAL LOW (ref >60)
GFR calc non Af Amer: 47 mL/min — ABNORMAL LOW (ref >60)
Glucose, Bld: 101 mg/dL — ABNORMAL HIGH (ref 70–99)
Potassium: 3.9 mmol/L (ref 3.5–5.1)
Sodium: 137 mmol/L (ref 135–145)

## 2019-05-25 LAB — CBC
HCT: 36.1 % — ABNORMAL LOW (ref 39.0–52.0)
Hemoglobin: 11.3 g/dL — ABNORMAL LOW (ref 13.0–17.0)
MCH: 26.3 pg (ref 26.0–34.0)
MCHC: 31.3 g/dL (ref 30.0–36.0)
MCV: 84.1 fL (ref 80.0–100.0)
Platelets: 111 10*3/uL — ABNORMAL LOW (ref 150–400)
RBC: 4.29 MIL/uL (ref 4.22–5.81)
RDW: 12.9 % (ref 11.5–15.5)
WBC: 4.6 10*3/uL (ref 4.0–10.5)
nRBC: 0 % (ref 0.0–0.2)

## 2019-05-25 LAB — GLUCOSE, CAPILLARY: Glucose-Capillary: 72 mg/dL (ref 70–99)

## 2019-05-25 MED ORDER — OXYCODONE-ACETAMINOPHEN 5-325 MG PO TABS: 1.0000 | ORAL_TABLET | Freq: Four times a day (QID) | ORAL | 0 refills | Status: DC | PRN

## 2019-05-25 NOTE — Progress Notes (Signed)
Inpatient Diabetes Program Recommendations  AACE/ADA: New Consensus Statement on Inpatient Glycemic Control (2015)  Target Ranges:  Prepandial:   less than 140 mg/dL      Peak postprandial:   less than 180 mg/dL (1-2 hours)      Critically ill patients:  140 - 180 mg/dL   Lab Results  Component Value Date   GLUCAP 72 05/25/2019   HGBA1C 9.8 (H) 05/19/2019    Review of Glycemic Control  Diabetes history: DM2 Outpatient Diabetes medications: Lantus 30 units bid, Amaryl 8 mg QAM Current orders for Inpatient glycemic control: Lantus 30 units bid, Amaryl 8 mg QAM  HgbA1C - 9.8% - needs tighter control.  Inpatient Diabetes Program Recommendations:     Add Novolog 0-9 units tidwc  Had hypo of 53 mg/dL before dinner last night.  Speak with pt regarding HgbA1C of 9.8% prior to d/c.  Thank you. Lorenda Peck, RD, LDN, CDE Inpatient Diabetes Coordinator [phone]

## 2019-05-25 NOTE — Progress Notes (Signed)
Discharged home w/ family. All paperwork and prescriptions given. All education completed, patient verbalized understanding.

## 2019-05-25 NOTE — Progress Notes (Signed)
Patient walked approximately 240 ft. Patient tolerated well but however patient was going too fast for safety purposes. Patient was advised to slow down so he doesn't fall. Patient acknowledged and understood.

## 2019-05-25 NOTE — Evaluation (Signed)
Physical Therapy Evaluation/ Discharge Patient Details Name: Cody Wood MRN: [id number] DOB: May 26, 1946 Today's Date: 05/25/2019   History of Present Illness  73 yo s/p Left common femoral and SFA endarterectomy. PMHx: hodgkin's lymphoma, DM, HTN, HLD, PAD  Clinical Impression  Pt very pleasant standing in room on arrival. Pt reports no pain throughout session and gait with ability to perform transfers, gait and stairs as well as HEP. PT states he would normally have to stop after grossly 150' due to pain but was able to complete 240' without pain. Pt with antalgic gait with limp and declined option of OPPT to normalize gait stating he has corrected his limp on his own before and will do it again. Pt educated for HEP and walking program and verbalized understanding. No further therapy needs with pt safe for D/C    Follow Up Recommendations No PT follow up(pt declined OPPT to normalize gait)    Equipment Recommendations  None recommended by PT    Recommendations for Other Services       Precautions / Restrictions Precautions Precautions: Fall      Mobility  Bed Mobility               General bed mobility comments: pt standing on arrival and declined bed mobility stating he sleeps in recliner at home  Transfers Overall transfer level: Modified independent               General transfer comment: pt able to sit and stand from chair without difficulty  Ambulation/Gait Ambulation/Gait assistance: Modified independent (Device/Increase time) Gait Distance (Feet): 240 Feet Assistive device: None Gait Pattern/deviations: Step-through pattern;Decreased stride length;Decreased stance time - left   Gait velocity interpretation: >4.37 ft/sec, indicative of normal walking speed General Gait Details: pt with antalgic gait with decreased stance time on left, pt reports baseline for several months and declined attempts to correct  Stairs Stairs: Yes Stairs assistance:  Modified independent (Device/Increase time) Stair Management: Step to pattern;Forwards;Two rails Number of Stairs: 5 General stair comments: pt varied between step to and alternating pattern to ascend stairs with cues for sequence initially but pt able to complete safely with reliance on rails  Wheelchair Mobility    Modified Rankin (Stroke Patients Only)       Balance Overall balance assessment: Mild deficits observed, not formally tested                                           Pertinent Vitals/Pain Pain Assessment: No/denies pain    Home Living Family/patient expects to be discharged to:: Private residence Living Arrangements: Other relatives Available Help at Discharge: Family;Available PRN/intermittently Type of Home: House Home Access: Stairs to enter Entrance Stairs-Rails: Psychiatric nurse of Steps: 5 Home Layout: One level Home Equipment: Walker - 2 wheels      Prior Function Level of Independence: Independent         Comments: limp at baseline for several years. Grandson lives with him who works     Journalist, newspaper        Extremity/Trunk Assessment   Upper Extremity Assessment Upper Extremity Assessment: Overall WFL for tasks assessed    Lower Extremity Assessment Lower Extremity Assessment: Overall WFL for tasks assessed    Cervical / Trunk Assessment Cervical / Trunk Assessment: Kyphotic  Communication   Communication: No difficulties  Cognition Arousal/Alertness: Awake/alert Behavior During Therapy: [facility]  for tasks assessed/performed Overall Cognitive Status: Within Functional Limits for tasks assessed                                        General Comments      Exercises General Exercises - Lower Extremity Long Arc Quad: AROM;20 reps;Both;Seated Hip ABduction/ADduction: AROM;Both;15 reps;Seated Hip Flexion/Marching: AROM;Both;15 reps;Seated   Assessment/Plan    PT Assessment  Patent does not need any further PT services  PT Problem List         PT Treatment Interventions      PT Goals (Current goals can be found in the Care Plan section)  Acute Rehab PT Goals PT Goal Formulation: All assessment and education complete, DC therapy    Frequency     Barriers to discharge        Co-evaluation               AM-PAC PT "6 Clicks" Mobility  Outcome Measure Help needed turning from your back to your side while in a flat bed without using bedrails?: None Help needed moving from lying on your back to sitting on the side of a flat bed without using bedrails?: None Help needed moving to and from a bed to a chair (including a wheelchair)?: None Help needed standing up from a chair using your arms (e.g., wheelchair or bedside chair)?: None Help needed to walk in hospital room?: None Help needed climbing 3-5 steps with a railing? : None 6 Click Score: 24    End of Session   Activity Tolerance: Patient tolerated treatment well Patient left: in chair;with call bell/phone within reach Nurse Communication: Mobility status PT Visit Diagnosis: Other abnormalities of gait and mobility (R26.89)    Time: [id number] PT Time Calculation (min) (ACUTE ONLY): 9 min   Charges:   PT Evaluation $PT Eval Low Complexity: [place], PT Acute Rehabilitation Services Pager: [phone] Office: [place] [facility] Mamoru Takeshita 05/25/2019, 12:13 PM

## 2019-05-25 NOTE — Discharge Summary (Signed)
Physician Discharge Summary   Patient ID: Cody Wood [id number] 73 y.o. 05-May-1946  Admit date: 05/24/2019  Discharge date and time: 05/25/2019 10:18 AM   Admitting Physician: [name], MD   Discharge Physician: same  Admission Diagnoses: left leg claudication  Discharge Diagnoses: same  Admission Condition: fair  Discharged Condition: good  Indication for Admission: Lifestyle limiting claudication of left lower extremity  Hospital Course: [name] is a 73 year old male who was brought in as an outpatient for left common femoral and SFA endarterectomy with bovine pericardial patch angioplasty by Dr. Carlis Abbott on 05/24/2019 due to lifestyle limiting claudication of left lower extremity.  He tolerated the procedure well and was admitted to the hospital postoperatively.  POD #1 he is tolerating a regular diet, ambulating without difficulty, and feeling ready for discharge home.  He now has a palpable DP pulse in left foot.  He will follow-up with Dr. Carlis Abbott in about 2 to 3 weeks for incision check.  He will be prescribed 2 to 3 days of narcotic pain medication for continued postoperative pain control.  Discharge instructions were reviewed with the patient and he voiced his understanding.  He will be discharged home this morning in stable condition.  Consults: None  Treatments: surgery: Left common femoral and SFA endarterectomy with bovine pericardial patch angioplasty by Dr. Carlis Abbott on 05/24/2019.  Discharge Exam: See progress note 05/25/2019 Vitals:   05/24/19 2027 05/25/19 0602  BP: (!) 160/79 118/72  Pulse:  (!) 58  Resp: 16 18  Temp: 98.3 F (36.8 C) 98.1 F (36.7 C)  SpO2: 100% 95%     Disposition: Discharge disposition: 01-Home or Self Care       Patient Instructions:  Allergies as of 05/25/2019   No Known Allergies     Medication List    TAKE these medications   aspirin 81 MG EC tablet Take 81 mg by mouth daily.   carvedilol 6.25 MG  tablet Commonly known as: COREG Take 6.25 mg by mouth 2 (two) times daily.   glimepiride 4 MG tablet Commonly known as: AMARYL Take 8 mg by mouth daily with breakfast.   Lantus SoloStar 100 UNIT/ML Solostar Pen Generic drug: Insulin Glargine Inject 30 Units into the skin 2 (two) times daily.   losartan 50 MG tablet Commonly known as: COZAAR Take 50 mg by mouth daily.   lovastatin 40 MG tablet Commonly known as: MEVACOR Take 40 mg by mouth every evening.   oxyCODONE-acetaminophen 5-325 MG tablet Commonly known as: PERCOCET/ROXICET Take 1 tablet by mouth every 6 (six) hours as needed for moderate pain.      Activity: activity as tolerated Diet: regular diet Wound Care: keep wound clean and dry  Follow-up with Dr. Carlis Abbott in 2 weeks.  SignedDagoberto Ligas 05/25/2019 10:28 AM

## 2019-05-25 NOTE — Progress Notes (Addendum)
  Progress Note    05/25/2019 7:26 AM 1 Day Post-Op  Subjective:  Walked this morning.  Ready for discharge home.   Vitals:   05/24/19 2027 05/25/19 0602  BP: (!) 160/79 118/72  Pulse:  (!) 58  Resp: 16 18  Temp: 98.3 F (36.8 C) 98.1 F (36.7 C)  SpO2: 100% 95%   Physical Exam: Lungs:  Non labored Incisions:  L groin incision c/d/i Extremities:  Palpable L DP pulse Abdomen:  Soft Neurologic: A&O  CBC    Component Value Date/Time   WBC 4.6 05/25/2019 0254   RBC 4.29 05/25/2019 0254   HGB 11.3 (L) 05/25/2019 0254   HGB 13.0 11/06/2015 0950   HCT 36.1 (L) 05/25/2019 0254   HCT 39.5 11/06/2015 0950   PLT 111 (L) 05/25/2019 0254   PLT 124 (L) 11/06/2015 0950   MCV 84.1 05/25/2019 0254   MCV 79.6 11/06/2015 0950   MCH 26.3 05/25/2019 0254   MCHC 31.3 05/25/2019 0254   RDW 12.9 05/25/2019 0254   RDW 13.9 11/06/2015 0950   LYMPHSABS 0.7 (L) 11/06/2015 0950   MONOABS 0.3 11/06/2015 0950   EOSABS 0.1 11/06/2015 0950   BASOSABS 0.0 11/06/2015 0950    BMET    Component Value Date/Time   NA 137 05/25/2019 0254   NA 137 11/06/2015 0950   K 3.9 05/25/2019 0254   K 4.6 11/06/2015 0950   CL 103 05/25/2019 0254   CL 101 10/27/2012 1045   CO2 24 05/25/2019 0254   CO2 28 11/06/2015 0950   GLUCOSE 101 (H) 05/25/2019 0254   GLUCOSE 309 (H) 11/06/2015 0950   GLUCOSE 174 (H) 10/27/2012 1045   BUN 20 05/25/2019 0254   BUN 40 (A) 01/25/2019   BUN 12.2 11/06/2015 0950   CREATININE 1.46 (H) 05/25/2019 0254   CREATININE 1.2 11/06/2015 0950   CALCIUM 8.8 (L) 05/25/2019 0254   CALCIUM 9.9 11/06/2015 0950   GFRNONAA 47 (L) 05/25/2019 0254   GFRAA 54 (L) 05/25/2019 0254    INR    Component Value Date/Time   INR 1.1 05/19/2019 1122     Intake/Output Summary (Last 24 hours) at 05/25/2019 0726 Last data filed at 05/25/2019 0606 Gross per 24 hour  Intake 1200 ml  Output 550 ml  Net 650 ml     Assessment/Plan:  73 y.o. male is s/p L CFA endarterectomy 1 Day Post-Op    Perfusing L foot; palpable DP Ok for discharge home today Follow up in 2-3 weeks with Dr. Suzette Battiest, PA-C [facility] [phone] 05/25/2019 7:26 AM  I have seen and evaluated the patient. I agree with the PA note as documented above. POD#1 s/p left femoral endarterectomy.  Left groin looks good.  Palpable L DP.  Plan for d/c home today.  F/U with me in 2-3 weeks for wound check.  [name], MD [facility] [place] Office: [phone] Pager: [phone] '

## 2019-05-25 NOTE — Discharge Instructions (Signed)
Incision Care, Adult °An incision is a cut that a doctor makes in your skin for surgery (for a procedure). Most times, these cuts are closed after surgery. Your cut from surgery may be closed with stitches (sutures), staples, skin glue, or skin tape (adhesive strips). You may need to return to your doctor to have stitches or staples taken out. This may happen many days or many weeks after your surgery. The cut needs to be well cared for so it does not get infected. °How to care for your cut °Cut care ° °· Follow instructions from your doctor about how to take care of your cut. Make sure you: °? Wash your hands with soap and water before you change your bandage (dressing). If you cannot use soap and water, use hand sanitizer. °? Change your bandage as told by your doctor. °? Leave stitches, skin glue, or skin tape in place. They may need to stay in place for 2 weeks or longer. If tape strips get loose and curl up, you may trim the loose edges. Do not remove tape strips completely unless your doctor says it is okay. °· Check your cut area every day for signs of infection. Check for: °? More redness, swelling, or pain. °? More fluid or blood. °? Warmth. °? Pus or a bad smell. °· Ask your doctor how to clean the cut. This may include: °? Using mild soap and water. °? Using a clean towel to pat the cut dry after you clean it. °? Putting a cream or ointment on the cut. Do this only as told by your doctor. °? Covering the cut with a clean bandage. °· Ask your doctor when you can leave the cut uncovered. °· Do not take baths, swim, or use a hot tub until your doctor says it is okay. Ask your doctor if you can take showers. You may only be allowed to take sponge baths for bathing. °Medicines °· If you were prescribed an antibiotic medicine, cream, or ointment, take the antibiotic or put it on the cut as told by your doctor. Do not stop taking or putting on the antibiotic even if your condition gets better. °· Take  over-the-counter and prescription medicines only as told by your doctor. °General instructions °· Limit movement around your cut. This helps healing. °? Avoid straining, lifting, or exercise for the first month, or for as long as told by your doctor. °? Follow instructions from your doctor about going back to your normal activities. °? Ask your doctor what activities are safe. °· Protect your cut from the sun when you are outside for the first 6 months, or for as long as told by your doctor. Put on sunscreen around the scar or cover up the scar. °· Keep all follow-up visits as told by your doctor. This is important. °Contact a doctor if: °· Your have more redness, swelling, or pain around the cut. °· You have more fluid or blood coming from the cut. °· Your cut feels warm to the touch. °· You have pus or a bad smell coming from the cut. °· You have a fever or shaking chills. °· You feel sick to your stomach (nauseous) or you throw up (vomit). °· You are dizzy. °· Your stitches or staples come undone. °Get help right away if: °· You have a red streak coming from your cut. °· Your cut bleeds through the bandage and the bleeding does not stop with gentle pressure. °· The edges of your cut   open up and separate. °· You have very bad (severe) pain. °· You have a rash. °· You are confused. °· You pass out (faint). °· You have trouble breathing and you have a fast heartbeat. °This information is not intended to replace advice given to you by your health care provider. Make sure you discuss any questions you have with your health care provider. °Document Released: 12/09/2011 Document Revised: 02/03/2017 Document Reviewed: 05/24/2016 °Elsevier Patient Education © 2020 [employer] ° °

## 2019-05-27 ENCOUNTER — Other Ambulatory Visit: Payer: Self-pay

## 2019-05-27 ENCOUNTER — Ambulatory Visit (INDEPENDENT_AMBULATORY_CARE_PROVIDER_SITE_OTHER): Payer: Self-pay | Admitting: Physician Assistant

## 2019-05-27 ENCOUNTER — Telehealth: Payer: Self-pay

## 2019-05-27 VITALS — BP 132/80 | HR 68 | Temp 97.9°F | Resp 12 | Ht 68.0 in | Wt 228.8 lb

## 2019-05-27 DIAGNOSIS — I739 Peripheral vascular disease, unspecified: Secondary | ICD-10-CM

## 2019-05-27 NOTE — Progress Notes (Signed)
  POST OPERATIVE OFFICE NOTE    CC:  F/u for surgery  HPI:  This is a 73 y.o. male who is s/p left CF and SFA endarterectomy with bovine pericardial patch angioplasty on 05/24/2019 by Dr. Carlis Abbott.  He went home on 8/25.  His post op course was unremarkable.  He had a palpable left DP pulse and groin incision looked good.   Pt presents today with c/o a knot around his incision.  He is doing well otherwise and states that he is able to walk better since his surgery.  Says he has to be careful bc a black bear has been spotted around his house.    No Known Allergies  Current Outpatient Medications  Medication Sig Dispense Refill  . aspirin 81 MG EC tablet Take 81 mg by mouth daily.     . carvedilol (COREG) 6.25 MG tablet Take 6.25 mg by mouth 2 (two) times daily.    Marland Kitchen glimepiride (AMARYL) 4 MG tablet Take 8 mg by mouth daily with breakfast.    . LANTUS SOLOSTAR 100 UNIT/ML Solostar Pen Inject 30 Units into the skin 2 (two) times daily.   0  . losartan (COZAAR) 50 MG tablet Take 50 mg by mouth daily.    Marland Kitchen lovastatin (MEVACOR) 40 MG tablet Take 40 mg by mouth every evening.    Marland Kitchen oxyCODONE-acetaminophen (PERCOCET/ROXICET) 5-325 MG tablet Take 1 tablet by mouth every 6 (six) hours as needed for moderate pain. 20 tablet 0   No current facility-administered medications for this visit.      ROS:  See HPI  Physical Exam:  Today's Vitals   05/27/19 1553 05/27/19 1554  BP: 132/80   Pulse: 68   Resp: 12   Temp: 97.9 F (36.6 C)   TempSrc: Temporal   SpO2: 98%   Weight: 228 lb 12.8 oz (103.8 kg)   Height: 5\' 8"  (1.727 m)   PainSc:  4    Body mass index is 34.79 kg/m.   Incision:  Left groin incision is healing nicely.  There is a small hematoma proximal/medially. Extremities:  Brisk left DP doppler signal   Assessment/Plan:  This is a 73 y.o. male who is s/p: left CF and SFA endarterectomy with bovine pericardial patch angioplasty on 05/24/2019 by Dr. Carlis Abbott who presents today with  concerns about his incision.  -pt's left groin incision is healing nicely with no evidence of infection.  There is no erythema or drainage.  He does have a small hematoma proximally/medially.  Discussed with pt that his body will absorb this over time.  Also discussed keeping the incision dry except for when he is in the shower and using some gauze to tuck in the groin while sitting to wick moisture.  He expressed understanding.   -he has f/u appt with Dr. Carlis Abbott in a couple of weeks and he will keep that appt.  He will call sooner should he have anymore issues.    Leontine Locket, PA-C [facility] [phone]  Clinic MD:  Oneida Alar

## 2019-05-27 NOTE — Telephone Encounter (Signed)
Cody Wood with [facility] called and said that when she called the patient for a follow up call he stated that he had a knot at his L groin incision site that seems to feel like it is about to pop. He said that he cannot see it to tell if it is red but it is definitely tender and swollen.   Appt made for pt to come in and have the area looked at.   York Cerise, CMA

## 2019-06-13 NOTE — Progress Notes (Signed)
  POST OPERATIVE OFFICE NOTE    CC:  F/u for surgery  HPI:  This is a 73 y.o. male who is s/p left CF and SFA endarterectomy with bovine pericardial patch angioplasty on 05/24/2019 by Dr. Carlis Abbott.  He went home on 8/25.  His post op course was unremarkable.  He had a palpable left DP pulse and groin incision looked good.   He presented to the office on 8/27 with c/o of a knot on his incision.  His left groin was healing well with no evidence of infection.  He comes in today for his follow up appt.  He states he is doing well.  He is not having any claudication sx.  He states the knot on his incision has gone down and the incision is healing.Marland Kitchen  He has no complaints today.  Says he props his foot up to help with swelling.  He states it helps but if he is up on it for a while, it will swell again but improves with elevation.  No Known Allergies  Current Outpatient Medications  Medication Sig Dispense Refill  . aspirin 81 MG EC tablet Take 81 mg by mouth daily.     . carvedilol (COREG) 6.25 MG tablet Take 6.25 mg by mouth 2 (two) times daily.    Marland Kitchen glimepiride (AMARYL) 4 MG tablet Take 8 mg by mouth daily with breakfast.    . LANTUS SOLOSTAR 100 UNIT/ML Solostar Pen Inject 30 Units into the skin 2 (two) times daily.   0  . losartan (COZAAR) 50 MG tablet Take 50 mg by mouth daily.    Marland Kitchen lovastatin (MEVACOR) 40 MG tablet Take 40 mg by mouth every evening.    Marland Kitchen oxyCODONE-acetaminophen (PERCOCET/ROXICET) 5-325 MG tablet Take 1 tablet by mouth every 6 (six) hours as needed for moderate pain. 20 tablet 0   No current facility-administered medications for this visit.      ROS:  See HPI  Physical Exam:  Today's Vitals   06/14/19 1435  BP: 140/78  Pulse: 78  Resp: 14  Temp: 97.8 F (36.6 C)  TempSrc: Temporal  SpO2: 98%  Weight: 222 lb 1.6 oz (100.7 kg)  Height: 5\' 8"  (1.727 m)   Body mass index is 33.77 kg/m.   Incision:  Left groin incision healing nicely.   Extremities:  Brisk  doppler signal left DP and palpable right DP    Assessment/Plan:  This is a 73 y.o. male who is s/p: left CF and SFA endarterectomy with bovine pericardial patch angioplasty on 05/24/2019 by Dr. Carlis Abbott who presented on 8/27 with concerns about his incision now follows up for his post operative visit.  -pt wound is healing and his claudication has resolved.   -continue asa/statin -will see pt back in 6-8 weeks with ABI's and see Dr. Carlis Abbott. -he will call sooner should he have any issues before then.    Leontine Locket, PA-C [facility] [phone]  Clinic MD:  Arita Miss

## 2019-06-14 ENCOUNTER — Other Ambulatory Visit: Payer: Self-pay

## 2019-06-14 ENCOUNTER — Ambulatory Visit (INDEPENDENT_AMBULATORY_CARE_PROVIDER_SITE_OTHER): Payer: Self-pay | Admitting: Physician Assistant

## 2019-06-14 VITALS — BP 140/78 | HR 78 | Temp 97.8°F | Resp 14 | Ht 68.0 in | Wt 222.1 lb

## 2019-06-14 DIAGNOSIS — I739 Peripheral vascular disease, unspecified: Secondary | ICD-10-CM

## 2019-06-21 ENCOUNTER — Telehealth: Payer: Self-pay

## 2019-06-21 DIAGNOSIS — E1122 Type 2 diabetes mellitus with diabetic chronic kidney disease: Secondary | ICD-10-CM

## 2019-06-21 DIAGNOSIS — Z794 Long term (current) use of insulin: Secondary | ICD-10-CM

## 2019-06-21 DIAGNOSIS — N183 Chronic kidney disease, stage 3 unspecified: Secondary | ICD-10-CM

## 2019-06-21 DIAGNOSIS — R5383 Other fatigue: Secondary | ICD-10-CM

## 2019-06-21 NOTE — Telephone Encounter (Signed)
Cody Wood, CMA  

## 2019-06-22 ENCOUNTER — Ambulatory Visit: Payer: Medicare Other | Admitting: "Endocrinology

## 2019-08-11 ENCOUNTER — Other Ambulatory Visit: Payer: Self-pay

## 2019-08-11 DIAGNOSIS — I739 Peripheral vascular disease, unspecified: Secondary | ICD-10-CM

## 2019-08-17 ENCOUNTER — Other Ambulatory Visit: Payer: Self-pay

## 2019-08-17 ENCOUNTER — Ambulatory Visit (INDEPENDENT_AMBULATORY_CARE_PROVIDER_SITE_OTHER): Payer: Self-pay | Admitting: Vascular Surgery

## 2019-08-17 ENCOUNTER — Encounter: Payer: Self-pay | Admitting: Vascular Surgery

## 2019-08-17 ENCOUNTER — Ambulatory Visit (HOSPITAL_COMMUNITY)
Admission: RE | Admit: 2019-08-17 | Discharge: 2019-08-17 | Disposition: A | Discharge status: Home / Self Care | Payer: Medicare Other | Source: Ambulatory Visit | Attending: Vascular Surgery | Admitting: Vascular Surgery

## 2019-08-17 VITALS — BP 131/73 | HR 56 | Temp 98.0°F | Resp 20 | Ht 68.0 in | Wt 226.0 lb

## 2019-08-17 DIAGNOSIS — I739 Peripheral vascular disease, unspecified: Secondary | ICD-10-CM | POA: Diagnosis present

## 2019-08-17 NOTE — Progress Notes (Signed)
Patient name: Cody Wood MRN: [id number] DOB: 02/25/1946 Sex: male  REASON FOR VISIT: Follow-up after left femoral endarterectomy for left lower extremity claudication  HPI: Cody Wood is a 73 y.o. male with history of hypertension, diabetes, coronary artery disease that presents for ongoing follow-up after left femoral endarterectomy.  Patient previously had a left femoral endarterectomy on 05/24/2023 for lifestyle limiting claudication of the left lower extremity.  He had a small knot that developed in his left groin that has since healed.  He has no new concerns today.  States his left leg is doing great and he no longer has any claudication symptoms.  States he can walk as far as he wants.  States he is not smoking.  States he has no issues with his right leg.  Past Medical History:  Diagnosis Date  . Blockage of coronary artery of heart (HCC)    Nonobstructive CAD by cath 2012  . Chronic bronchitis ([place])   . Diabetes mellitus    metformin  . hodgkins lymphoma dx'd 06/2008  . Hypertension   . Peripheral vascular disease [facility])     Past Surgical History:  Procedure Laterality Date  . ABDOMINAL AORTOGRAM W/LOWER EXTREMITY N/A 05/12/2019   Procedure: ABDOMINAL AORTOGRAM W/LOWER EXTREMITY;  Surgeon: [name], MD;  Location: [place] CV [facility];  Service: Cardiovascular;  Laterality: N/A;  . CERVICAL SPINE SURGERY  2003  . ENDARTERECTOMY FEMORAL Left 05/24/2019   Procedure: ENDARTERECTOMY FEMORAL;  Surgeon: [name], MD;  Location: [place];  Service: Vascular;  Laterality: Left;  . KNEE SURGERY    . LEFT HEART CATHETERIZATION WITH CORONARY ANGIOGRAM N/A 09/03/2011   Procedure: LEFT HEART CATHETERIZATION WITH CORONARY ANGIOGRAM;  Surgeon: Laverda Page, MD;  Location: [facility] CATH LAB;  Service: Cardiovascular;  Laterality: N/A;  . MANDIBLE SURGERY    . NECK SURGERY    . PATCH ANGIOPLASTY Left 05/24/2019   Procedure: Patch Angioplasty Superficial Femoral  Artery;  Surgeon: [name], MD;  Location: [place];  Service: Vascular;  Laterality: Left;    History reviewed. No pertinent family history.  SOCIAL HISTORY: Social History   Tobacco Use  . Smoking status: Former Smoker    Quit date: 11/05/1985    Years since quitting: 33.8  . Smokeless tobacco: Current User    Types: Chew  Substance Use Topics  . Alcohol use: No    No Known Allergies  Current Outpatient Medications  Medication Sig Dispense Refill  . aspirin 81 MG EC tablet Take 81 mg by mouth daily.     . carvedilol (COREG) 6.25 MG tablet Take 6.25 mg by mouth 2 (two) times daily.    Marland Kitchen glimepiride (AMARYL) 4 MG tablet Take 8 mg by mouth daily with breakfast.    . LANTUS SOLOSTAR 100 UNIT/ML Solostar Pen Inject 30 Units into the skin 2 (two) times daily.   0  . losartan (COZAAR) 50 MG tablet Take 50 mg by mouth daily.    Marland Kitchen lovastatin (MEVACOR) 40 MG tablet Take 40 mg by mouth every evening.     No current facility-administered medications for this visit.     REVIEW OF SYSTEMS:  [X]  denotes positive finding, [ ]  denotes negative finding Cardiac  Comments:  Chest pain or chest pressure:    Shortness of breath upon exertion:    Short of breath when lying flat:    Irregular heart rhythm:        Vascular    Pain in  calf, thigh, or hip brought on by ambulation:    Pain in feet at night that wakes you up from your sleep:     Blood clot in your veins:    Leg swelling:         Pulmonary    Oxygen at home:    Productive cough:     Wheezing:         Neurologic    Sudden weakness in arms or legs:     Sudden numbness in arms or legs:     Sudden onset of difficulty speaking or slurred speech:    Temporary loss of vision in one eye:     Problems with dizziness:         Gastrointestinal    Blood in stool:     Vomited blood:         Genitourinary    Burning when urinating:     Blood in urine:        Psychiatric    Major depression:         Hematologic     Bleeding problems:    Problems with blood clotting too easily:        Skin    Rashes or ulcers:        Constitutional    Fever or chills:      PHYSICAL EXAM: Vitals:   08/17/19 1352  BP: 131/73  Pulse: (!) 56  Resp: 20  Temp: 98 F (36.7 C)  SpO2: 98%  Weight: 226 lb (102.5 kg)  Height: 5\' 8"  (1.727 m)    GENERAL: The patient is a well-nourished male, in no acute distress. The vital signs are documented above. CARDIAC: There is a regular rate and rhythm.  VASCULAR:  Vertical left groin incision well-healed.  No masses or other fullness. Palpable left femoral pulse No palpable left pedal pulses but he has biphasic signals at the ankle.  DATA:   ABIs are noncompressible.  He has biphasic signals at the left ankle and biphasic signals at the right ankle (right PT occluded)  Assessment/Plan:  73 year old male status post left femoral endarterectomy on 05/24/2019 for left lifestyle limiting claudication of left lower extremity.  Symptoms are completely resolved.  His groin is now completely healed.  Very happy with his progress.  Ultimately discussed next steps for follow-up and he will let us know if he has any issues and will contact her office.  Follow-up as needed.   [name], MD [facility] Office: [phone] Pager: [phone]

## 2019-10-17 ENCOUNTER — Emergency Department (HOSPITAL_COMMUNITY): Payer: Medicare Other

## 2019-10-17 ENCOUNTER — Other Ambulatory Visit: Payer: Self-pay

## 2019-10-17 ENCOUNTER — Encounter (HOSPITAL_COMMUNITY): Payer: Self-pay | Admitting: *Deleted

## 2019-10-17 ENCOUNTER — Emergency Department (HOSPITAL_COMMUNITY)
Admission: EM | Admit: 2019-10-17 | Discharge: 2019-10-17 | Disposition: A | Discharge status: Home / Self Care | Payer: Medicare Other | Attending: Emergency Medicine | Admitting: Emergency Medicine

## 2019-10-17 DIAGNOSIS — Z87891 Personal history of nicotine dependence: Secondary | ICD-10-CM | POA: Insufficient documentation

## 2019-10-17 DIAGNOSIS — R519 Headache, unspecified: Secondary | ICD-10-CM | POA: Insufficient documentation

## 2019-10-17 DIAGNOSIS — Y929 Unspecified place or not applicable: Secondary | ICD-10-CM | POA: Insufficient documentation

## 2019-10-17 DIAGNOSIS — E1122 Type 2 diabetes mellitus with diabetic chronic kidney disease: Secondary | ICD-10-CM | POA: Diagnosis not present

## 2019-10-17 DIAGNOSIS — W010XXA Fall on same level from slipping, tripping and stumbling without subsequent striking against object, initial encounter: Secondary | ICD-10-CM | POA: Diagnosis not present

## 2019-10-17 DIAGNOSIS — Z79899 Other long term (current) drug therapy: Secondary | ICD-10-CM | POA: Diagnosis not present

## 2019-10-17 DIAGNOSIS — N183 Chronic kidney disease, stage 3 unspecified: Secondary | ICD-10-CM | POA: Insufficient documentation

## 2019-10-17 DIAGNOSIS — Z794 Long term (current) use of insulin: Secondary | ICD-10-CM | POA: Insufficient documentation

## 2019-10-17 DIAGNOSIS — R109 Unspecified abdominal pain: Secondary | ICD-10-CM | POA: Insufficient documentation

## 2019-10-17 DIAGNOSIS — Y939 Activity, unspecified: Secondary | ICD-10-CM | POA: Insufficient documentation

## 2019-10-17 DIAGNOSIS — I129 Hypertensive chronic kidney disease with stage 1 through stage 4 chronic kidney disease, or unspecified chronic kidney disease: Secondary | ICD-10-CM | POA: Insufficient documentation

## 2019-10-17 DIAGNOSIS — R0789 Other chest pain: Secondary | ICD-10-CM | POA: Insufficient documentation

## 2019-10-17 DIAGNOSIS — Y999 Unspecified external cause status: Secondary | ICD-10-CM | POA: Diagnosis not present

## 2019-10-17 DIAGNOSIS — W19XXXA Unspecified fall, initial encounter: Secondary | ICD-10-CM

## 2019-10-17 NOTE — ED Provider Notes (Signed)
[name] EMERGENCY DEPARTMENT Provider Note   CSN: [id number] Arrival date & time: 10/17/19  1224     History Chief Complaint  Patient presents with   Cody Wood is a 74 y.o. male.  Patient states that he slipped and fell on concrete hitting his head and right side of his chest.  Patient had no loss conscious but complains of chest abdomen head pain  The history is provided by the patient and a significant other.  Fall This is a new problem. The current episode started 6 to 12 hours ago. The problem occurs rarely. The problem has been resolved. Associated symptoms include chest pain and headaches. Pertinent negatives include no abdominal pain. Nothing aggravates the symptoms. Nothing relieves the symptoms.       Past Medical History:  Diagnosis Date   Blockage of coronary artery of heart (HCC)    Nonobstructive CAD by cath 2012   Chronic bronchitis ([place])    Diabetes mellitus    metformin   hodgkins lymphoma dx'd 06/2008   Hypertension    Peripheral vascular disease ([place])     Patient Active Problem List   Diagnosis Date Noted   PAD (peripheral artery disease) ([place]) 05/04/2019   Type 2 diabetes mellitus with stage 3 chronic kidney disease, with long-term current use of insulin ([place]) 02/11/2019   Essential hypertension, benign 02/11/2019   Mixed hyperlipidemia 02/11/2019   Muscle weakness (generalized) 01/28/2014   Pain in joint, upper arm 01/28/2014   Decreased range of motion of right shoulder 01/28/2014   Closed fracture of right proximal humerus 12/23/2013   Personal history of falling, presenting hazards to health 05/05/2012   Bilateral leg weakness 05/05/2012   Hodgkin's lymphoma ([place]) 04/29/2012   Chest pain on exertion 09/03/2011    Past Surgical History:  Procedure Laterality Date   ABDOMINAL AORTOGRAM W/LOWER EXTREMITY N/A 05/12/2019   Procedure: ABDOMINAL AORTOGRAM W/LOWER EXTREMITY;  Surgeon: [name],  MD;  Location: [place] [facility];  Service: Cardiovascular;  Laterality: N/A;   CERVICAL SPINE SURGERY  2003   ENDARTERECTOMY FEMORAL Left 05/24/2019   Procedure: ENDARTERECTOMY FEMORAL;  Surgeon: [name], MD;  Location: [place];  Service: Vascular;  Laterality: Left;   KNEE SURGERY     LEFT HEART CATHETERIZATION WITH CORONARY ANGIOGRAM N/A 09/03/2011   Procedure: LEFT HEART CATHETERIZATION WITH CORONARY ANGIOGRAM;  Surgeon: Laverda Page, MD;  Location: [facility] CATH LAB;  Service: Cardiovascular;  Laterality: N/A;   MANDIBLE SURGERY     NECK SURGERY     PATCH ANGIOPLASTY Left 05/24/2019   Procedure: Patch Angioplasty Superficial Femoral Artery;  Surgeon: [name], MD;  Location: [place];  Service: Vascular;  Laterality: Left;       No family history on file.  Social History   Tobacco Use   Smoking status: Former Smoker    Quit date: 11/05/1985    Years since quitting: 33.9   Smokeless tobacco: Current User    Types: Chew  Substance Use Topics   Alcohol use: No   Drug use: No    Home Medications Prior to Admission medications   Medication Sig Start Date End Date Taking? Authorizing Provider  aspirin 81 MG EC tablet Take 81 mg by mouth daily.     [provider]  carvedilol (COREG) 6.25 MG tablet Take 6.25 mg by mouth 2 (two) times daily. 04/15/19   [provider]  glimepiride (AMARYL) 4 MG tablet Take 8 mg by  mouth daily with breakfast. 04/15/19   [provider]  LANTUS SOLOSTAR 100 UNIT/ML Solostar Pen Inject 30 Units into the skin 2 (two) times daily.  10/04/14   [provider]  losartan (COZAAR) 50 MG tablet Take 50 mg by mouth daily. 12/04/13   [provider]  lovastatin (MEVACOR) 40 MG tablet Take 40 mg by mouth every evening. 05/05/19   [provider]    Allergies    Patient has no known allergies.  Review of Systems   Review of Systems  Constitutional: Negative for appetite change and  fatigue.  HENT: Negative for congestion, ear discharge and sinus pressure.   Eyes: Negative for discharge.  Respiratory: Negative for cough.   Cardiovascular: Positive for chest pain.  Gastrointestinal: Negative for abdominal pain and diarrhea.  Genitourinary: Negative for frequency and hematuria.  Musculoskeletal: Negative for back pain.  Skin: Negative for rash.  Neurological: Positive for headaches. Negative for seizures.  Psychiatric/Behavioral: Negative for hallucinations.    Physical Exam Updated Vital Signs BP (!) 153/66    Pulse (!) 55    Temp 98.6 F (37 C) (Oral)    Resp 18    Ht 5\' 8"  (1.727 m)    Wt 104.3 kg    SpO2 95%    BMI 34.97 kg/m   Physical Exam Vitals and nursing note reviewed.  Constitutional:      Appearance: He is well-developed.  HENT:     Head: Normocephalic.     Nose: Nose normal.  Eyes:     General: No scleral icterus.    Conjunctiva/sclera: Conjunctivae normal.  Neck:     Thyroid: No thyromegaly.  Cardiovascular:     Rate and Rhythm: Normal rate and regular rhythm.     Heart sounds: No murmur. No friction rub. No gallop.   Pulmonary:     Breath sounds: No stridor. No wheezing or rales.     Comments: Tenderness right chest Chest:     Chest wall: No tenderness.  Abdominal:     General: There is no distension.     Tenderness: There is abdominal tenderness. There is no rebound.     Comments: Tender right upper quadrant  Musculoskeletal:        General: Normal range of motion.     Cervical back: Neck supple.  Lymphadenopathy:     Cervical: No cervical adenopathy.  Skin:    Findings: No erythema or rash.  Neurological:     Mental Status: He is oriented to person, place, and time.     Motor: No abnormal muscle tone.     Coordination: Coordination normal.  Psychiatric:        Behavior: Behavior normal.     ED Results / Procedures / Treatments   Labs (all labs ordered are listed, but only abnormal results are displayed) Labs Reviewed -  No data to display  EKG None  Radiology CT ABDOMEN WO CONTRAST  Result Date: 10/17/2019 CLINICAL DATA:  74 year old male with history of abdominal trauma from a fall. Right upper quadrant abdominal pain and right-sided rib cage pain. EXAM: CT CHEST AND ABDOMEN WITHOUT CONTRAST TECHNIQUE: Multidetector CT imaging of the chest and abdomen was performed following the standard protocol without IV contrast. COMPARISON:  CT the abdomen and pelvis 10/24/2014. FINDINGS: Comment: Today's study is limited for detection and characterization of visceral and/or vascular lesions by lack of IV contrast. CT CHEST FINDINGS Cardiovascular: No evidence of significant acute traumatic injury to the thoracic aorta on today's  noncontrast CT examination. Heart size is normal. There is no significant pericardial fluid, thickening or pericardial calcification. There is aortic atherosclerosis, as well as atherosclerosis of the great vessels of the mediastinum and the coronary arteries, including calcified atherosclerotic plaque in the left anterior descending, left circumflex and right coronary arteries. Calcifications of the superior aspect of the mitral annulus. Mediastinum/Nodes: No high attenuation fluid in the mediastinum to suggest posttraumatic mediastinal hematoma. No pathologically enlarged mediastinal or hilar lymph nodes. Please note that accurate exclusion of hilar adenopathy is limited on noncontrast CT scans. Esophagus is unremarkable in appearance. No axillary lymphadenopathy. Lungs/Pleura: No pneumothorax. No acute consolidative airspace disease. No pleural effusions. No suspicious appearing pulmonary nodules or masses are noted. Areas of mild linear scarring or subsegmental atelectasis are noted in the lower lobes of the lungs bilaterally. Musculoskeletal: Orthopedic fixation hardware in the lower cervical spine incidentally noted. There are no acute displaced fractures or aggressive appearing lytic or blastic lesions  noted in the visualized portions of the skeleton. CT ABDOMEN FINDINGS Hepatobiliary: No definite evidence of significant acute traumatic injury to the liver on today's noncontrast examination. No suspicious cystic or solid hepatic lesions are confidently identified on today's noncontrast CT examination. Unenhanced appearance of the gallbladder is normal. Pancreas: No definite evidence of acute traumatic injury to the pancreas. No definite pancreatic mass or peripancreatic fluid collections or inflammatory changes are noted on today's noncontrast CT examination. Spleen: No definite evidence of acute traumatic injury to the spleen on today's noncontrast CT examination. Unremarkable. Adrenals/Urinary Tract: Unenhanced appearance of the kidneys and bilateral adrenal glands is normal. Specifically, no definite evidence of acute traumatic injury. No hydroureteronephrosis in the visualized portions of the abdomen. Stomach/Bowel: No definite evidence of acute traumatic injury to the hollow viscera in the visualized portions of the peritoneal cavity on today's noncontrast CT examination. Vascular/Lymphatic: Aortic atherosclerosis. No definite signs of significant acute traumatic injury to the abdominal aorta on today's noncontrast CT examination. No lymphadenopathy noted in the abdomen. Other: No high attenuation fluid collection in the peritoneal cavity or retroperitoneum to suggest posttraumatic hemorrhage. No significant volume of ascites and no pneumoperitoneum noted in the visualized portions of the peritoneal cavity. Musculoskeletal: No acute displaced fractures or aggressive appearing lytic or blastic lesions are noted in the visualized portions of the skeleton. IMPRESSION: 1. No history of significant acute traumatic injury to the chest or abdomen on today's noncontrast CT examination. 2. Aortic atherosclerosis, in addition to 3 vessel coronary artery disease. Assessment for potential risk factor modification,  dietary therapy or pharmacologic therapy may be warranted, if clinically indicated. 3. There are calcifications of the mitral annulus. Echocardiographic correlation for evaluation of potential valvular dysfunction may be warranted if clinically indicated. Electronically Signed   By: Vinnie Langton M.D.   On: 10/17/2019 14:01   CT Head Wo Contrast  Result Date: 10/17/2019 CLINICAL DATA:  [place] today and hit head. EXAM: CT HEAD WITHOUT CONTRAST CT CERVICAL SPINE WITHOUT CONTRAST TECHNIQUE: Multidetector CT imaging of the head and cervical spine was performed following the standard protocol without intravenous contrast. Multiplanar CT image reconstructions of the cervical spine were also generated. COMPARISON:  06/18/2017 FINDINGS: CT HEAD FINDINGS Brain: No evidence of acute infarction, hemorrhage, hydrocephalus, extra-axial collection or mass lesion/mass effect. Vascular: No hyperdense vessel or unexpected calcification. Skull: Normal. Negative for fracture or focal lesion. Sinuses/Orbits: The paranasal sinuses and mastoid air cells are clear. The globes are intact. Other: No scalp lesions or hematoma. CT CERVICAL SPINE FINDINGS Alignment: Anterior  and interbody fusion changes noted from C3-C6. There is also interbody fusion at C6-7. Normal overall alignment. Skull base and vertebrae: No acute fractures identified. Soft tissues and spinal canal: No prevertebral fluid or swelling. No visible canal hematoma. Disc levels:  No significant spinal or foraminal stenosis. Upper chest: The visualized lung apices are grossly clear. Other: None IMPRESSION: 1. No acute intracranial findings or skull fracture. 2. No acute cervical spine fracture. 3. Anterior and interbody fusion changes from C3-C6. Electronically Signed   By: Marijo Sanes M.D.   On: 10/17/2019 13:43   CT Chest Wo Contrast  Result Date: 10/17/2019 CLINICAL DATA:  74 year old male with history of abdominal trauma from a fall. Right upper quadrant  abdominal pain and right-sided rib cage pain. EXAM: CT CHEST AND ABDOMEN WITHOUT CONTRAST TECHNIQUE: Multidetector CT imaging of the chest and abdomen was performed following the standard protocol without IV contrast. COMPARISON:  CT the abdomen and pelvis 10/24/2014. FINDINGS: Comment: Today's study is limited for detection and characterization of visceral and/or vascular lesions by lack of IV contrast. CT CHEST FINDINGS Cardiovascular: No evidence of significant acute traumatic injury to the thoracic aorta on today's noncontrast CT examination. Heart size is normal. There is no significant pericardial fluid, thickening or pericardial calcification. There is aortic atherosclerosis, as well as atherosclerosis of the great vessels of the mediastinum and the coronary arteries, including calcified atherosclerotic plaque in the left anterior descending, left circumflex and right coronary arteries. Calcifications of the superior aspect of the mitral annulus. Mediastinum/Nodes: No high attenuation fluid in the mediastinum to suggest posttraumatic mediastinal hematoma. No pathologically enlarged mediastinal or hilar lymph nodes. Please note that accurate exclusion of hilar adenopathy is limited on noncontrast CT scans. Esophagus is unremarkable in appearance. No axillary lymphadenopathy. Lungs/Pleura: No pneumothorax. No acute consolidative airspace disease. No pleural effusions. No suspicious appearing pulmonary nodules or masses are noted. Areas of mild linear scarring or subsegmental atelectasis are noted in the lower lobes of the lungs bilaterally. Musculoskeletal: Orthopedic fixation hardware in the lower cervical spine incidentally noted. There are no acute displaced fractures or aggressive appearing lytic or blastic lesions noted in the visualized portions of the skeleton. CT ABDOMEN FINDINGS Hepatobiliary: No definite evidence of significant acute traumatic injury to the liver on today's noncontrast examination. No  suspicious cystic or solid hepatic lesions are confidently identified on today's noncontrast CT examination. Unenhanced appearance of the gallbladder is normal. Pancreas: No definite evidence of acute traumatic injury to the pancreas. No definite pancreatic mass or peripancreatic fluid collections or inflammatory changes are noted on today's noncontrast CT examination. Spleen: No definite evidence of acute traumatic injury to the spleen on today's noncontrast CT examination. Unremarkable. Adrenals/Urinary Tract: Unenhanced appearance of the kidneys and bilateral adrenal glands is normal. Specifically, no definite evidence of acute traumatic injury. No hydroureteronephrosis in the visualized portions of the abdomen. Stomach/Bowel: No definite evidence of acute traumatic injury to the hollow viscera in the visualized portions of the peritoneal cavity on today's noncontrast CT examination. Vascular/Lymphatic: Aortic atherosclerosis. No definite signs of significant acute traumatic injury to the abdominal aorta on today's noncontrast CT examination. No lymphadenopathy noted in the abdomen. Other: No high attenuation fluid collection in the peritoneal cavity or retroperitoneum to suggest posttraumatic hemorrhage. No significant volume of ascites and no pneumoperitoneum noted in the visualized portions of the peritoneal cavity. Musculoskeletal: No acute displaced fractures or aggressive appearing lytic or blastic lesions are noted in the visualized portions of the skeleton. IMPRESSION: 1. No history  of significant acute traumatic injury to the chest or abdomen on today's noncontrast CT examination. 2. Aortic atherosclerosis, in addition to 3 vessel coronary artery disease. Assessment for potential risk factor modification, dietary therapy or pharmacologic therapy may be warranted, if clinically indicated. 3. There are calcifications of the mitral annulus. Echocardiographic correlation for evaluation of potential valvular  dysfunction may be warranted if clinically indicated. Electronically Signed   By: Vinnie Langton M.D.   On: 10/17/2019 14:01   CT Cervical Spine Wo Contrast  Result Date: 10/17/2019 CLINICAL DATA:  [place] today and hit head. EXAM: CT HEAD WITHOUT CONTRAST CT CERVICAL SPINE WITHOUT CONTRAST TECHNIQUE: Multidetector CT imaging of the head and cervical spine was performed following the standard protocol without intravenous contrast. Multiplanar CT image reconstructions of the cervical spine were also generated. COMPARISON:  06/18/2017 FINDINGS: CT HEAD FINDINGS Brain: No evidence of acute infarction, hemorrhage, hydrocephalus, extra-axial collection or mass lesion/mass effect. Vascular: No hyperdense vessel or unexpected calcification. Skull: Normal. Negative for fracture or focal lesion. Sinuses/Orbits: The paranasal sinuses and mastoid air cells are clear. The globes are intact. Other: No scalp lesions or hematoma. CT CERVICAL SPINE FINDINGS Alignment: Anterior and interbody fusion changes noted from C3-C6. There is also interbody fusion at C6-7. Normal overall alignment. Skull base and vertebrae: No acute fractures identified. Soft tissues and spinal canal: No prevertebral fluid or swelling. No visible canal hematoma. Disc levels:  No significant spinal or foraminal stenosis. Upper chest: The visualized lung apices are grossly clear. Other: None IMPRESSION: 1. No acute intracranial findings or skull fracture. 2. No acute cervical spine fracture. 3. Anterior and interbody fusion changes from C3-C6. Electronically Signed   By: Marijo Sanes M.D.   On: 10/17/2019 13:43    Procedures Procedures (including critical care time)  Medications Ordered in ED Medications - No data to display  ED Course  I have reviewed the triage vital signs and the nursing notes.  Pertinent labs & imaging results that were available during my care of the patient were reviewed by me and considered in my medical decision making  (see chart for details).    MDM Rules/Calculators/A&P                      CT scan of the head neck chest and abdomen are all negative.  Patient had a fall with contusion to chest and abdomen and head.  Patient will follow up as needed take Tylenol for pain Final Clinical Impression(s) / ED Diagnoses Final diagnoses:  Fall, initial encounter    Rx / DC Orders ED Discharge Orders    None       Milton Ferguson, MD 10/17/19 1415

## 2019-10-17 NOTE — ED Triage Notes (Signed)
Pt was walking across the street this am to see his son when he tripped over a rake, pt reports that he hit his head and hit right rib cage as well, reports that he was nauseous afterwards and felt sob,

## 2019-10-17 NOTE — Discharge Instructions (Addendum)
Tylenol or Motrin for pain and follow-up with your family doctor if any problem

## 2019-12-09 ENCOUNTER — Encounter: Payer: Self-pay | Admitting: Pulmonary Disease

## 2019-12-09 ENCOUNTER — Ambulatory Visit (INDEPENDENT_AMBULATORY_CARE_PROVIDER_SITE_OTHER): Payer: Medicare Other | Admitting: Pulmonary Disease

## 2019-12-09 ENCOUNTER — Other Ambulatory Visit: Payer: Self-pay

## 2019-12-09 DIAGNOSIS — G4733 Obstructive sleep apnea (adult) (pediatric): Secondary | ICD-10-CM | POA: Diagnosis not present

## 2019-12-09 NOTE — Patient Instructions (Signed)
You likely have obstructive sleep apnea Schedule home sleep test. Based on this, we will try to get you a CPAP machine

## 2019-12-09 NOTE — Progress Notes (Signed)
Subjective:    Patient ID: Cody Wood, male    DOB: 05-25-1946, 74 y.o.   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 He reports excessive daytime somnolence and in the morning his son and grandson have to shake him to wake him up because he goes into a deep sleep.  He had stent placement for peripheral arterial disease and during his hospitalization, the nurses had to come into the room because he was triggering the oxygen saturation alarms.  He also reports loud snoring and gasping and choking episodes that have occasionally woken him up from sleep.  No bed partner history is available today  Epworth sleepiness score is 8 Bedtime is around 11:30 PM, sleep latency is 30 to 60 minutes, he generally sleeps in a recliner due to back pain.  He is able to sleep for a little bit sitting up but if he lies down then he has gasping episodes.  Reports 2-3 nocturnal awakenings including nocturia and is out of bed at 6 AM feeling tired with dryness of mouth and occasional headaches. He naps for an hour in the afternoon while watching TV There is no history suggestive of cataplexy, sleep paralysis or parasomnias  He has gained 30 pounds in the last 2 years. He drinks 2 to 3 cups of coffee daily.  Past medical history includes insulin requiring diabetes, hypertension, lymphoma in 2003, neck surgery in 2003 which led to disability  He smoked about 2 packs/day for 30 years before he quit in [place] tests/ events reviewed  CT chest 10/2019 clear lungs  Past Medical History:  Diagnosis Date  . Blockage of coronary artery of heart (HCC)    Nonobstructive CAD by cath 2012  . Chronic bronchitis ([place])   . Diabetes mellitus    metformin  . hodgkins lymphoma dx'd 06/2008  . Hypertension   . Peripheral vascular disease [facility])    Past Surgical History:  Procedure Laterality Date  . ABDOMINAL AORTOGRAM W/LOWER EXTREMITY  N/A 05/12/2019   Procedure: ABDOMINAL AORTOGRAM W/LOWER EXTREMITY;  Surgeon: [name], MD;  Location: [facility];  Service: Cardiovascular;  Laterality: N/A;  . CERVICAL SPINE SURGERY  2003  . ENDARTERECTOMY FEMORAL Left 05/24/2019   Procedure: ENDARTERECTOMY FEMORAL;  Surgeon: [name], MD;  Location: [place];  Service: Vascular;  Laterality: Left;  . KNEE SURGERY    . LEFT HEART CATHETERIZATION WITH CORONARY ANGIOGRAM N/A 09/03/2011   Procedure: LEFT HEART CATHETERIZATION WITH CORONARY ANGIOGRAM;  Surgeon: Laverda Page, MD;  Location: [facility] CATH LAB;  Service: Cardiovascular;  Laterality: N/A;  . MANDIBLE SURGERY    . NECK SURGERY    . PATCH ANGIOPLASTY Left 05/24/2019   Procedure: Patch Angioplasty Superficial Femoral Artery;  Surgeon: [name], MD;  Location: [place];  Service: Vascular;  Laterality: Left;   No Known Allergies  Social History   Socioeconomic History  . Marital status: Divorced    Spouse name: Not on file  . Number of children: Not on file  . Years of education: Not on file  . Highest education level: Not on file  Occupational History  . Not on file  Tobacco Use  . Smoking status: Former Smoker    Quit date: 11/05/1985    Years since quitting: 34.1  . Smokeless tobacco: Current User    Types: Chew  Substance and Sexual Activity  . Alcohol use: No  .  Drug use: No  . Sexual activity: Not on file  Other Topics Concern  . Not on file  Social History Narrative  . Not on file   Social Determinants of Health   Financial Resource Strain:   . Difficulty of Paying Living Expenses:   Food Insecurity:   . Worried About Charity fundraiser in the Last Year:   . Arboriculturist in the Last Year:   Transportation Needs:   . Film/video editor (Medical):   Marland Kitchen Lack of Transportation (Non-Medical):   Physical Activity:   . Days of Exercise per Week:   . Minutes of Exercise per Session:   Stress:   . Feeling of Stress :    Social Connections:   . Frequency of Communication with Friends and Family:   . Frequency of Social Gatherings with Friends and Family:   . Attends Religious Services:   . Active Member of Clubs or Organizations:   . Attends Archivist Meetings:   Marland Kitchen Marital Status:   Intimate Partner Violence:   . Fear of Current or Ex-Partner:   . Emotionally Abused:   Marland Kitchen Physically Abused:   . Sexually Abused:        History reviewed. No pertinent family history.   Review of Systems Constitutional: negative for anorexia, fevers and sweats  Eyes: negative for irritation, redness and visual disturbance  Ears, nose, mouth, throat, and face: negative for earaches, epistaxis, nasal congestion and sore throat  Respiratory: negative for cough, dyspnea on exertion, sputum and wheezing  Cardiovascular: negative for chest pain, dyspnea, lower extremity edema, orthopnea, palpitations and syncope  Gastrointestinal: negative for abdominal pain, constipation, diarrhea, melena, nausea and vomiting  Genitourinary:negative for dysuria, frequency and hematuria  Hematologic/lymphatic: negative for bleeding, easy bruising and lymphadenopathy  Musculoskeletal:negative for arthralgias, muscle weakness and stiff joints  Neurological: negative for coordination problems, gait problems, headaches and weakness  Endocrine: negative for diabetic symptoms including polydipsia, polyuria and weight loss     Objective:   Physical Exam  Gen. Pleasant, obese, in no distress, normal affect ENT - no pallor,icterus, no post nasal drip, class 2-3 airway Neck: No JVD, no thyromegaly, no carotid bruits Lungs: no use of accessory muscles, no dullness to percussion, decreased without rales or rhonchi  Cardiovascular: Rhythm regular, heart sounds  normal, no murmurs or gallops, no peripheral edema Abdomen: soft and non-tender, no hepatosplenomegaly, BS normal. Musculoskeletal: No deformities, no cyanosis or clubbing  Neuro:  alert, non focal, no tremors       Assessment & Plan:    Assessment:   OSA, likely severe, likely high cardiovascular risk  Plan Following Extensive Data Review & Interpretation:  . I reviewed prior external note(s) from PCP, vascular surgery . I reviewed the result(s) of CT chest 10/2019 . I have ordered home sleep test  Independent interpretation of tests . Review of patient's CT images revealed clear lungs. The patient's images have been independently reviewed by me.

## 2019-12-09 NOTE — Assessment & Plan Note (Signed)
Pretest probability is high, high cardiovascular risk  Given excessive daytime somnolence, narrow pharyngeal exam, witnessed apneas & loud snoring, obstructive sleep apnea is very likely & an overnight polysomnogram will be scheduled as a home study. The pathophysiology of obstructive sleep apnea , it's cardiovascular consequences & modes of treatment including CPAP were discused with the patient in detail & they evidenced understanding.  He will likely need a full facemask since he is a mouth breather

## 2020-01-06 ENCOUNTER — Other Ambulatory Visit: Payer: Self-pay

## 2020-01-06 DIAGNOSIS — G4733 Obstructive sleep apnea (adult) (pediatric): Secondary | ICD-10-CM

## 2020-01-11 ENCOUNTER — Other Ambulatory Visit: Payer: Self-pay

## 2020-01-12 ENCOUNTER — Telehealth: Payer: Self-pay | Admitting: Pulmonary Disease

## 2020-01-12 DIAGNOSIS — G4733 Obstructive sleep apnea (adult) (pediatric): Secondary | ICD-10-CM | POA: Diagnosis not present

## 2020-01-12 NOTE — Telephone Encounter (Signed)
Noted on order

## 2020-01-12 NOTE — Telephone Encounter (Signed)
Severe OSA Start autoCPAP 5-20 cm , full fac emask, OV in 6 weeks with me Also schedule formal CPAP titration study to see if he needs O2, since he had severe desatn

## 2020-01-12 NOTE — Telephone Encounter (Signed)
ATC pt, no answer. Left message for pt to call back.  

## 2020-01-12 NOTE — Telephone Encounter (Signed)
Called and spoke to pt. Informed him of the results and recs per Dr. Elsworth Soho. Order placed for auto CPAP and for CPAP titration. Appt scheduled with Rexene Edison, NP, for 03/01/2020. Pt verbalized understanding and denied any further questions or concerns at this time.   Will forward to [facility] as the study has not been uploaded into chart yet.

## 2020-01-12 NOTE — Telephone Encounter (Signed)
Pt returning a phone call. Pt can be reached at [phone].

## 2020-01-13 ENCOUNTER — Other Ambulatory Visit (HOSPITAL_BASED_OUTPATIENT_CLINIC_OR_DEPARTMENT_OTHER): Payer: Self-pay

## 2020-01-18 ENCOUNTER — Telehealth: Payer: Self-pay | Admitting: Pulmonary Disease

## 2020-01-18 NOTE — Telephone Encounter (Signed)
Sleep study faxed. Nothing further is needed.

## 2020-01-28 ENCOUNTER — Other Ambulatory Visit: Payer: Self-pay

## 2020-01-28 ENCOUNTER — Other Ambulatory Visit (HOSPITAL_COMMUNITY)
Admission: RE | Admit: 2020-01-28 | Discharge: 2020-01-28 | Disposition: A | Discharge status: Home / Self Care | Payer: Medicare Other | Source: Ambulatory Visit | Attending: Pulmonary Disease | Admitting: Pulmonary Disease

## 2020-01-28 DIAGNOSIS — Z01812 Encounter for preprocedural laboratory examination: Secondary | ICD-10-CM | POA: Diagnosis present

## 2020-01-28 DIAGNOSIS — Z20822 Contact with and (suspected) exposure to covid-19: Secondary | ICD-10-CM | POA: Insufficient documentation

## 2020-01-29 LAB — SARS CORONAVIRUS 2 (TAT 6-24 HRS): SARS Coronavirus 2: NEGATIVE

## 2020-01-31 ENCOUNTER — Other Ambulatory Visit: Payer: Self-pay

## 2020-01-31 ENCOUNTER — Emergency Department (HOSPITAL_COMMUNITY)
Admission: EM | Admit: 2020-01-31 | Discharge: 2020-01-31 | Discharge status: Absent without leave | Payer: Medicare Other

## 2020-01-31 ENCOUNTER — Ambulatory Visit: Payer: Medicare Other | Attending: Pulmonary Disease | Admitting: Pulmonary Disease

## 2020-01-31 DIAGNOSIS — G4733 Obstructive sleep apnea (adult) (pediatric): Secondary | ICD-10-CM

## 2020-02-17 DIAGNOSIS — G4733 Obstructive sleep apnea (adult) (pediatric): Secondary | ICD-10-CM | POA: Diagnosis not present

## 2020-02-17 NOTE — Procedures (Signed)
Patient Name: Cody Wood, Cody Wood Date: 01/31/2020 Gender: Male D.O.B: [birth year] Age (years): 68 Referring Provider: [name] MD, ABSM Height (inches): 65 Interpreting Physician: [name] MD, ABSM Weight (lbs): 229 RPSGT: Rosebud Poles BMI: 38 MRN: [id number] Neck Size: 17.00 <br> <br> CLINICAL INFORMATION The patient is referred for a CPAP titration to treat sleep apnea.  Date of HST:01/12/20 showed severe OSA  SLEEP STUDY TECHNIQUE As per the AASM Manual for the Scoring of Sleep and Associated Events v2.3 (April 2016) with a hypopnea requiring 4% desaturations.  The channels recorded and monitored were frontal, central and occipital EEG, electrooculogram (EOG), submentalis EMG (chin), nasal and oral airflow, thoracic and abdominal wall motion, anterior tibialis EMG, snore microphone, electrocardiogram, and pulse oximetry. Continuous positive airway pressure (CPAP) was initiated at the beginning of the study and titrated to treat sleep-disordered breathing.  MEDICATIONS Medications self-administered by patient taken the night of the study : N/A  TECHNICIAN COMMENTS Comments added by technician: Patient tolerated CPAP fairly well. Awakened once for the pressure to be decreased. CPAP therapy started at 5 cm of H2O and increased to 20 cm of H20, with an EPR of 3. Pressure was later decreased to 17 cm of H20 in lateral postion with a final pressure of 18 cm of H20 in lateral position with good control of events. Patient had difficulties in keeping his mouth closed during pressure increases of 17-20 cm of H20 in supine position. Suboptimal pressure obtained due to patients' intolerance of high pressures in supine position. EKG = PVC's at times Comments added by scorer: N/A RESPIRATORY PARAMETERS Optimal PAP Pressure (cm): 17 AHI at Optimal Pressure (/hr): 10 Overall Minimal O2 (%): 52.0 Supine % at Optimal Pressure (%): N/A Minimal O2 at Optimal Pressure (%): 52.0   SLEEP  ARCHITECTURE The study was initiated at 12:03:06 AM and ended at 6:01:58 AM.  Sleep onset time was 0.0 minutes and the sleep efficiency was 80.8%%. The total sleep time was 289.8 minutes.  The patient spent 9.4%% of the night in stage N1 sleep, 70.6%% in stage N2 sleep, 16.7%% in stage N3 and 3.3% in REM.Stage REM latency was 173.3 minutes  Wake after sleep onset was 69.1. Alpha intrusion was absent. Supine sleep was 80.63%.  CARDIAC DATA The 2 lead EKG demonstrated sinus rhythm. The mean heart rate was 53.6 beats per minute. Other EKG findings include: PVCs. LEG MOVEMENT DATA The total Periodic Limb Movements of Sleep (PLMS) were 0. The PLMS index was 0.0. A PLMS index of <15 is considered normal in adults.  IMPRESSIONS - An optimal PAP pressure of 17 cm wase selected for this patient based on the available study data. - Mild Central Sleep Apnea was noted during this titration (CAI = 7.0/h). - Severe oxygen desaturations were observed during this titration (min O2 = 52.0%). - The patient snored with soft snoring volume during this titration study. - 2-lead EKG demonstrated: PVCs - Clinically significant periodic limb movements were not noted during this study. Arousals associated with PLMs were rare.   DIAGNOSIS - Obstructive Sleep Apnea (327.23 [G47.33 ICD-10]) - Nocturnal Hypoxemia (327.26 [G47.36 ICD-10])   RECOMMENDATIONS - Recommend a trial of Auto-CPAP 10-20 cm H2O. Average pressure wsa 17 cm in lateral position ,may require higher pressure in supine position - Avoid alcohol, sedatives and other CNS depressants that may worsen sleep apnea and disrupt normal sleep architecture. - Sleep hygiene should be reviewed to assess factors that may improve sleep quality. - Weight management and regular exercise should  be initiated or continued. - Return to Sleep Center for re-evaluation after 4 weeks of therapy  [name] MD Board Certified in [place]

## 2020-02-21 ENCOUNTER — Telehealth: Payer: Self-pay | Admitting: Adult Health

## 2020-02-21 NOTE — Telephone Encounter (Signed)
Went out to the lobby to speak with patient Patient had his CPAP mask and water chamber thinking it was his actual CPAP machine.  This equipment was given to him by [facility].  Advised patient that what he has is NOT his actual machine.  Asked patient to wait while I did some research.  Per patient's chart, order was sent to [facility] on 01/18/20 for new CPAP start but the last documentation was that the DME was waiting on a copy of the HST.  Called [employer] and spoke with [place] - they have everything they need.  They have ATC patient 3 times to schedule appt for setup but never heard back from him.  The last time they call was while pt was admitted.  Number for [employer] obtained from [place] and handed to patient to call when he gets home.  Patient voiced his understanding.  Nothing further needed; will sign off.

## 2020-03-01 ENCOUNTER — Encounter: Payer: Self-pay | Admitting: Adult Health

## 2020-03-01 ENCOUNTER — Other Ambulatory Visit: Payer: Self-pay

## 2020-03-01 ENCOUNTER — Ambulatory Visit: Payer: Medicare Other | Admitting: Adult Health

## 2020-03-01 DIAGNOSIS — G4733 Obstructive sleep apnea (adult) (pediatric): Secondary | ICD-10-CM | POA: Diagnosis not present

## 2020-03-01 NOTE — Addendum Note (Signed)
Addended by: Vanessa Barbara on: 03/01/2020 10:03 AM   Modules accepted: Orders

## 2020-03-01 NOTE — Assessment & Plan Note (Signed)
Weight loss encouraged 

## 2020-03-01 NOTE — Assessment & Plan Note (Signed)
Severe obstructive sleep apnea-new CPAP start. Patient education given on CPAP and CPAP mask. He was switched over to his full facemask. Will adjust CPAP pressure for comfort. 10 to 20 cm H2O  Plan  Patient Instructions  Adjust CPAP pressure to 10 to 20 cm H2O .  Please wear full face mask at night.  Try to wear CPAP all night (at least 4-6hrs ) .  Work on healthy weight l.  Do not drive if sleepy .  Follow up in 6-8 weeks with Dr. Elsworth Soho  Or Gjon Letarte NP and As needed   Please contact office for sooner follow up if symptoms do not improve or worsen or seek emergency care

## 2020-03-01 NOTE — Patient Instructions (Signed)
Adjust CPAP pressure to 10 to 20 cm H2O .  Please wear full face mask at night.  Try to wear CPAP all night (at least 4-6hrs ) .  Work on healthy weight l.  Do not drive if sleepy .  Follow up in 6-8 weeks with Dr. Elsworth Soho  Or Neftaly Swiss NP and As needed   Please contact office for sooner follow up if symptoms do not improve or worsen or seek emergency care

## 2020-03-01 NOTE — Progress Notes (Signed)
@Patient  ID: Cody Wood, male    DOB: January 13, 1946, 74 y.o.   MRN: [id number]  Chief Complaint  Patient presents with  . 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  Mild central sleep apnea CAI 7/hour, severe oxygen desaturations were observed during titration with O2 saturation SPO2 low at 52% positive snoring.  Clinically significant periodic limb movements.  Recommendations for an auto CPAP at 10 to 20 cm H2O.  03/01/2020 Follow up : OSA  Patient returns for a 63-month follow-up visit.  Patient was seen March 2021 for a sleep consult for daytime sleepiness.  Patient was set up for a home sleep study that showed severe sleep apnea with AHI of 37 an hour in April.  Patient was sent for a CPAP titration study that was done on Jan 31, 2020 that showed optimal PAP pressure at 17 cm H2O.  There was some residual mild central sleep apnea with a CAI at 7/hour.  With severe oxygen desaturations.  Patient was recommended for auto CPAP at 10 to 20 cm H2O. Patient says he just recently got his machine has started to try to wear it but however feels the pressure is way too much.  He got a fullface mask and a nasal mass.  He has been wearing the nasal pillows says that it feels like the pressure goes way up but he says he has been open his mouth to breathe as well.  Patient education on mask and how CPAP works.  Patient advised to switch back to full face-dream wear .   No Known Allergies  Immunization History  Administered Date(s) Administered  . Influenza, High Dose Seasonal PF 06/13/2014, 07/24/2016, 07/15/2017, 07/29/2018  . Moderna SARS-COVID-2 Vaccination 12/08/2019,  01/05/2020  . Pneumococcal Conjugate-13 06/13/2014  . Pneumococcal Polysaccharide-23 04/23/2012    Past Medical History:  Diagnosis Date  . Blockage of coronary artery of heart (HCC)    Nonobstructive CAD by cath 2012  . Chronic bronchitis ([place])   . Diabetes mellitus    metformin  . hodgkins lymphoma dx'd 06/2008  . Hypertension   . Peripheral vascular disease ([place])     Tobacco History: Social History   Tobacco Use  Smoking Status Former Smoker  . Packs/day: 2.00  . Years: 40.00  . Pack years: 80.00  . Types: Cigarettes  . Quit date: 11/05/1985  . Years since quitting: 34.3  Smokeless Tobacco Current User  . Types: Chew   Ready to quit: Not Answered Counseling given: Not Answered   Outpatient Medications Prior to Visit  Medication Sig Dispense Refill  . aspirin 81 MG EC tablet Take 81 mg by mouth daily.     . carvedilol (COREG) 6.25 MG tablet Take 6.25 mg by mouth 2 (two) times daily.    Marland Kitchen glimepiride (AMARYL) 4 MG tablet Take 8 mg by mouth daily with breakfast.    . LANTUS SOLOSTAR 100 UNIT/ML Solostar Pen Inject 30 Units into the skin 2 (two) times daily.   0  . losartan (COZAAR) 50 MG tablet Take 50 mg by mouth daily.    Marland Kitchen lovastatin (MEVACOR) 40 MG tablet Take 40 mg by mouth every evening.    Marland Kitchen  rosuvastatin (CRESTOR) 20 MG tablet Take 20 mg by mouth at bedtime.     No facility-administered medications prior to visit.     Review of Systems:   Constitutional:   No  weight loss, night sweats,  Fevers, chills, + fatigue, or  lassitude.  HEENT:   No headaches,  Difficulty swallowing,  Tooth/dental problems, or  Sore throat,                No sneezing, itching, ear ache, nasal congestion, post nasal drip,   CV:  No chest pain,  Orthopnea, PND, + swelling in lower extremities, no anasarca, dizziness, palpitations, syncope.   GI  No heartburn, indigestion, abdominal pain, nausea, vomiting, diarrhea, change in bowel habits, loss of appetite, bloody stools.    Resp: No shortness of breath with exertion or at rest.  No excess mucus, no productive cough,  No non-productive cough,  No coughing up of blood.  No change in color of mucus.  No wheezing.  No chest wall deformity  Skin: no rash or lesions.  GU: no dysuria, change in color of urine, no urgency or frequency.  No flank pain, no hematuria   MS:  No joint pain or swelling.  No decreased range of motion.  No back pain.    Physical Exam  BP 123/60 (BP Location: Left Arm, Cuff Size: Normal)   Wood 66   Temp 98.3 F (36.8 C) (Oral)   Ht 5\' 8"  (1.727 m)   Wt 227 lb 9.6 oz (103.2 kg)   SpO2 99%   BMI 34.61 kg/m   GEN: A/Ox3; pleasant , NAD, well nourished    HEENT:  Cody Wood,   NOSE-clear, THROAT-clear, no lesions, no postnasal drip or exudate noted. Class III MP airway  NECK:  Supple w/ fair ROM; no JVD; normal carotid impulses w/o bruits; no thyromegaly or nodules palpated; no lymphadenopathy.    RESP  Clear  P & A; w/o, wheezes/ rales/ or rhonchi. no accessory muscle use, no dullness to percussion  CARD:  RRR, no m/r/g, no peripheral edema, pulses intact, no cyanosis or clubbing.  GI:   Soft & nt; nml bowel sounds; no organomegaly or masses detected.   Musco: Warm bil, no deformities or joint swelling noted.   Neuro: alert, no focal deficits noted.    Skin: Warm, no lesions or rashes    Lab Results:  BMET  BNP No results found for: BNP Imaging: Cpap titration  Result Date: 01/31/2020 Cody Noel, MD     02/17/2020  8:57 AM Patient Name: Cody Wood, Cody Wood Date: 01/31/2020 Gender: Male D.O.B: [birth year] Age (years): 83 Referring Provider: [name] MD, ABSM Height (inches): 65 Interpreting Physician: Cody Mead MD, ABSM Weight (lbs): 229 RPSGT: Cody Wood BMI: 38 MRN: [id number] Neck Size: 17.00   CLINICAL INFORMATION The patient is referred for a CPAP titration to treat sleep apnea. Date of HST:01/12/20 showed severe OSA SLEEP STUDY TECHNIQUE As per the AASM  Manual for the Scoring of Sleep and Associated Events v2.3 (April 2016) with a hypopnea requiring 4% desaturations. The channels recorded and monitored were frontal, central and occipital EEG, electrooculogram (EOG), submentalis EMG (chin), nasal and oral airflow, thoracic and abdominal wall motion, anterior tibialis EMG, snore microphone, electrocardiogram, and Wood oximetry. Continuous positive airway pressure (CPAP) was initiated at the beginning of the study and titrated to treat sleep-disordered breathing. MEDICATIONS Medications self-administered by patient taken the night of the study : N/A TECHNICIAN COMMENTS Comments added by technician: Patient  tolerated CPAP fairly well. Awakened once for the pressure to be decreased. CPAP therapy started at 5 cm of H2O and increased to 20 cm of H20, with an EPR of 3. Pressure was later decreased to 17 cm of H20 in lateral postion with a final pressure of 18 cm of H20 in lateral position with good control of events. Patient had difficulties in keeping his mouth closed during pressure increases of 17-20 cm of H20 in supine position. Suboptimal pressure obtained due to patients' intolerance of high pressures in supine position. EKG = PVC's at times Comments added by scorer: N/A RESPIRATORY PARAMETERS Optimal PAP Pressure (cm): 17 AHI at Optimal Pressure (/hr): 10 Overall Minimal O2 (%): 52.0 Supine % at Optimal Pressure (%): N/A Minimal O2 at Optimal Pressure (%): 52.0 SLEEP ARCHITECTURE The study was initiated at 12:03:06 AM and ended at 6:01:58 AM. Sleep onset time was 0.0 minutes and the sleep efficiency was 80.8%%. The total sleep time was 289.8 minutes. The patient spent 9.4%% of the night in stage N1 sleep, 70.6%% in stage N2 sleep, 16.7%% in stage N3 and 3.3% in REM.Stage REM latency was 173.3 minutes Wake after sleep onset was 69.1. Alpha intrusion was absent. Supine sleep was 80.63%. CARDIAC DATA The 2 lead EKG demonstrated sinus rhythm. The mean heart rate was  53.6 beats per minute. Other EKG findings include: PVCs. LEG MOVEMENT DATA The total Periodic Limb Movements of Sleep (PLMS) were 0. The PLMS index was 0.0. A PLMS index of <15 is considered normal in adults. IMPRESSIONS - An optimal PAP pressure of 17 cm wase selected for this patient based on the available study data. - Mild Central Sleep Apnea was noted during this titration (CAI = 7.0/h). - Severe oxygen desaturations were observed during this titration (min O2 = 52.0%). - The patient snored with soft snoring volume during this titration study. - 2-lead EKG demonstrated: PVCs - Clinically significant periodic limb movements were not noted during this study. Arousals associated with PLMs were rare. DIAGNOSIS - Obstructive Sleep Apnea (327.23 [G47.33 ICD-10]) - Nocturnal Hypoxemia (327.26 [G47.36 ICD-10]) RECOMMENDATIONS - Recommend a trial of Auto-CPAP 10-20 cm H2O. Average pressure wsa 17 cm in lateral position ,may require higher pressure in supine position - Avoid alcohol, sedatives and other CNS depressants that may worsen sleep apnea and disrupt normal sleep architecture. - Sleep hygiene should be reviewed to assess factors that may improve sleep quality. - Weight management and regular exercise should be initiated or continued. - Return to Sleep Center for re-evaluation after 4 weeks of therapy Cody Mead MD Board Certified in Sleep medicine      No [place] found.  No results found for: NITRICOXIDE      Assessment & Plan:   OSA (obstructive sleep apnea) Severe obstructive sleep apnea-new CPAP start. Patient education given on CPAP and CPAP mask. He was switched over to his full facemask. Will adjust CPAP pressure for comfort. 10 to 20 cm H2O  Plan  Patient Instructions  Adjust CPAP pressure to 10 to 20 cm H2O .  Please wear full face mask at night.  Try to wear CPAP all night (at least 4-6hrs ) .  Work on healthy weight l.  Do not drive if sleepy .  Follow up in 6-8 weeks  with Dr. Elsworth Wood  Or Cody Verne NP and As needed   Please contact office for sooner follow up if symptoms do not improve or worsen or seek emergency care  Morbid obesity ([place]) Weight loss encouraged     Rexene Edison, NP 03/01/2020

## 2020-04-17 ENCOUNTER — Ambulatory Visit: Payer: Medicare Other | Admitting: Pulmonary Disease

## 2020-06-02 ENCOUNTER — Telehealth: Payer: Self-pay | Admitting: "Endocrinology

## 2020-06-02 DIAGNOSIS — I10 Essential (primary) hypertension: Secondary | ICD-10-CM

## 2020-06-02 DIAGNOSIS — Z794 Long term (current) use of insulin: Secondary | ICD-10-CM

## 2020-06-02 DIAGNOSIS — N183 Type 2 diabetes mellitus with diabetic chronic kidney disease: Secondary | ICD-10-CM

## 2020-06-02 DIAGNOSIS — E1122 Type 2 diabetes mellitus with diabetic chronic kidney disease: Secondary | ICD-10-CM

## 2020-06-02 NOTE — Telephone Encounter (Signed)
Sent orders for lab work to [facility].

## 2020-06-02 NOTE — Telephone Encounter (Signed)
Patient was a new patient here in 2020 and has not been seen since. Patient would like to schedule a follow up appointment. If patient needs labs done can you please add the orders and let me know so I can schedule patient accordingly.

## 2020-06-06 NOTE — Telephone Encounter (Signed)
Mailed lab orders to patient and informed him to call office and schedule follow up once getting labs done.

## 2020-08-14 LAB — MICROALBUMIN, URINE: Microalb, Ur: 36

## 2020-12-12 LAB — BASIC METABOLIC PANEL
BUN: 44 — AB (ref 4–21)
Creatinine: 2 — AB (ref 0.6–1.3)

## 2020-12-12 LAB — COMPREHENSIVE METABOLIC PANEL
Calcium: 9.6 (ref 8.7–10.7)
GFR calc non Af Amer: 34

## 2020-12-12 LAB — LIPID PANEL
Cholesterol: 100 (ref 0–200)
HDL: 30 — AB (ref 35–70)
LDL Cholesterol: 69
Triglycerides: 130 (ref 40–160)

## 2020-12-12 LAB — TSH: TSH: 1.52 (ref 0.41–5.90)

## 2020-12-12 LAB — HEMOGLOBIN A1C: Hemoglobin A1C: 12.1

## 2020-12-18 ENCOUNTER — Ambulatory Visit: Payer: Medicare Other | Admitting: "Endocrinology

## 2020-12-27 LAB — MICROALBUMIN, URINE: Microalb, Ur: 22

## 2021-01-30 ENCOUNTER — Other Ambulatory Visit: Payer: Self-pay

## 2021-01-30 ENCOUNTER — Ambulatory Visit: Payer: Medicare Other | Admitting: "Endocrinology

## 2021-01-30 ENCOUNTER — Encounter: Payer: Self-pay | Admitting: "Endocrinology

## 2021-01-30 VITALS — BP 110/50 | HR 60 | Ht 68.0 in | Wt 237.2 lb

## 2021-01-30 DIAGNOSIS — E1122 Type 2 diabetes mellitus with diabetic chronic kidney disease: Secondary | ICD-10-CM | POA: Diagnosis not present

## 2021-01-30 DIAGNOSIS — E782 Mixed hyperlipidemia: Secondary | ICD-10-CM | POA: Diagnosis not present

## 2021-01-30 DIAGNOSIS — Z794 Long term (current) use of insulin: Secondary | ICD-10-CM

## 2021-01-30 DIAGNOSIS — I10 Essential (primary) hypertension: Secondary | ICD-10-CM | POA: Diagnosis not present

## 2021-01-30 DIAGNOSIS — N183 Chronic kidney disease, stage 3 unspecified: Secondary | ICD-10-CM | POA: Diagnosis not present

## 2021-01-30 MED ORDER — GLIPIZIDE ER 5 MG PO TB24: 5.0000 mg | ORAL_TABLET | Freq: Every day | ORAL | 1 refills | Status: DC

## 2021-01-30 NOTE — Patient Instructions (Signed)
                                     Advice for Weight Management  -For most of us the best way to lose weight is by diet management. Generally speaking, diet management means consuming less calories intentionally which over time brings about progressive weight loss.  This can be achieved more effectively by restricting carbohydrate consumption to the minimum possible.  So, it is critically important to know your numbers: how much calorie you are consuming and how much calorie you need. More importantly, our carbohydrates sources should be unprocessed or minimally processed complex starch food items.   Sometimes, it is important to balance nutrition by increasing protein intake (animal or plant source), fruits, and vegetables.  -Sticking to a routine mealtime to eat 3 meals a day and avoiding unnecessary snacks is shown to have a big role in weight control. Under normal circumstances, the only time we lose real weight is when we are hungry, so allow hunger to take place- hunger means no food between meal times, only water.  It is not advisable to starve.   -It is better to avoid simple carbohydrates including: Cakes, Sweet Desserts, Ice Cream, Soda (diet and regular), Sweet Tea, Candies, Chips, Cookies, Store Bought Juices, Alcohol in Excess of  1-2 drinks a day, Lemonade,  Artificial Sweeteners, Doughnuts, Coffee Creamers, "Sugar-free" Products, etc, etc.  This is not a complete list.....    -Consulting with certified diabetes educators is proven to provide you with the most accurate and current information on diet.  Also, you may be  interested in discussing diet options/exchanges , we can schedule a visit with Cody Wood, RDN, CDE for individualized nutrition education.  -Exercise: If you are able: 30 -60 minutes a day ,4 days a week, or 150 minutes a week.  The longer the better.  Combine stretch, strength, and aerobic activities.  If you were told in the  past that you have high risk for cardiovascular diseases, you may seek evaluation by your heart doctor prior to initiating moderate to intense exercise programs.                                  Additional Care Considerations for Diabetes   -Diabetes  is a chronic disease.  The most important care consideration is regular follow-up with your diabetes care provider with the goal being avoiding or delaying its complications and to take advantage of advances in medications and technology.    -Type 2 diabetes is known to coexist with other important comorbidities such as high blood pressure and high cholesterol.  It is critical to control not only the diabetes but also the high blood pressure and high cholesterol to minimize and delay the risk of complications including coronary artery disease, stroke, amputations, blindness, etc.    - Studies showed that people with diabetes will benefit from a class of medications known as ACE inhibitors and statins.  Unless there are specific reasons not to be on these medications, the standard of care is to consider getting one from these groups of medications at an optimal doses.  These medications are generally considered safe and proven to help protect the heart and the kidneys.    - People with diabetes are encouraged to initiate and maintain regular follow-up with eye doctors, foot   doctors, dentists , and if necessary heart and kidney doctors.     - It is highly recommended that people with diabetes quit smoking or stay away from smoking, and get yearly  flu vaccine and pneumonia vaccine at least every 5 years.  One other important lifestyle recommendation is to ensure adequate sleep - at least 6-7 hours of uninterrupted sleep at night.  -Exercise: If you are able: 30 -60 minutes a day, 4 days a week, or 150 minutes a week.  The longer the better.  Combine stretch, strength, and aerobic activities.  If you were told in the past that you have high risk for  cardiovascular diseases, you may seek evaluation by your heart doctor prior to initiating moderate to intense exercise programs.          

## 2021-01-30 NOTE — Progress Notes (Signed)
01/30/2021, 12:46 PM  Endocrinology follow-up note   Subjective:    Patient ID: Cody Wood, male    DOB: [birth year].  Cody Wood is being seen in follow-up after he was seen consultation for management of currently uncontrolled symptomatic diabetes in May 2020 requested by  Heywood Bene, PA-C. He disappeared and never came back for follow-up.  His current motivation is so he can get his eyeglasses.  Past Medical History:  Diagnosis Date  . Blockage of coronary artery of heart (HCC)    Nonobstructive CAD by cath 2012  . Chronic bronchitis ([place])   . Diabetes mellitus    metformin  . hodgkins lymphoma dx'd 06/2008  . Hypertension   . Peripheral vascular disease [facility])     Past Surgical History:  Procedure Laterality Date  . ABDOMINAL AORTOGRAM W/LOWER EXTREMITY N/A 05/12/2019   Procedure: ABDOMINAL AORTOGRAM W/LOWER EXTREMITY;  Surgeon: [name], MD;  Location: [place] CV LAB;  Service: Cardiovascular;  Laterality: N/A;  . CERVICAL SPINE SURGERY  2003  . ENDARTERECTOMY FEMORAL Left 05/24/2019   Procedure: ENDARTERECTOMY FEMORAL;  Surgeon: [name], MD;  Location: [place];  Service: Vascular;  Laterality: Left;  . KNEE SURGERY    . LEFT HEART CATHETERIZATION WITH CORONARY ANGIOGRAM N/A 09/03/2011   Procedure: LEFT HEART CATHETERIZATION WITH CORONARY ANGIOGRAM;  Surgeon: Laverda Page, MD;  Location: [facility] CATH LAB;  Service: Cardiovascular;  Laterality: N/A;  . MANDIBLE SURGERY    . NECK SURGERY    . PATCH ANGIOPLASTY Left 05/24/2019   Procedure: Patch Angioplasty Superficial Femoral Artery;  Surgeon: [name], MD;  Location: [facility] OR;  Service: Vascular;  Laterality: Left;    Social History   Socioeconomic History  . Marital status: Divorced    Spouse name: Not on file  . Number of children: Not on file  . Years of education: Not on file  . Highest  education level: Not on file  Occupational History  . Not on file  Tobacco Use  . Smoking status: Former Smoker    Packs/day: 2.00    Years: 40.00    Pack years: 80.00    Types: Cigarettes    Quit date: 11/05/1985    Years since quitting: 35.2  . Smokeless tobacco: Current User    Types: Chew  Vaping Use  . Vaping Use: Never used  Substance and Sexual Activity  . Alcohol use: No  . Drug use: No  . Sexual activity: Not on file  Other Topics Concern  . Not on file  Social History Narrative  . Not on file   Social Determinants of Health   Financial Resource Strain: Not on file  Food Insecurity: Not on file  Transportation Needs: Not on file  Physical Activity: Not on file  Stress: Not on file  Social Connections: Not on file    History reviewed. No pertinent family history.  Outpatient Encounter Medications as of 01/30/2021  Medication Sig  . glipiZIDE (GLUCOTROL XL) 5 MG 24 hr tablet Take 1 tablet (5 mg total) by mouth daily with breakfast.  . aspirin 81 MG EC tablet Take 81 mg by mouth daily.   Marland Kitchen  carvedilol (COREG) 6.25 MG tablet Take 6.25 mg by mouth 2 (two) times daily.  Marland Kitchen LANTUS SOLOSTAR 100 UNIT/ML Solostar Pen Inject 60 Units into the skin at bedtime.  Marland Kitchen losartan (COZAAR) 50 MG tablet Take 50 mg by mouth daily. (Patient not taking: Reported on 01/30/2021)  . lovastatin (MEVACOR) 40 MG tablet Take 40 mg by mouth every evening.  . rosuvastatin (CRESTOR) 20 MG tablet Take 20 mg by mouth at bedtime.  . [DISCONTINUED] glimepiride (AMARYL) 4 MG tablet Take 8 mg by mouth daily with breakfast.   No facility-administered encounter medications on file as of 01/30/2021.    ALLERGIES: No Known Allergies  VACCINATION STATUS: Immunization History  Administered Date(s) Administered  . Influenza, High Dose Seasonal PF 06/13/2014, 07/24/2016, 07/15/2017, 07/29/2018  . Moderna Sars-Covid-2 Vaccination 12/08/2019, 01/05/2020  . Pneumococcal Conjugate-13 06/13/2014  . Pneumococcal  Polysaccharide-23 04/23/2012    Diabetes He presents for his follow-up diabetic visit. He has type 2 diabetes mellitus. Onset time: He was diagnosed at approximate age of 75 years. His disease course has been worsening. Pertinent negatives for hypoglycemia include no confusion, dizziness, headaches, hunger, pallor, seizures, sweats or tremors. Associated symptoms include blurred vision, polydipsia and polyuria. Pertinent negatives for diabetes include no chest pain, no fatigue, no polyphagia and no weakness. Pertinent negatives for hypoglycemia complications include no blackouts and no required assistance. Symptoms are worsening. Diabetic complications include heart disease and nephropathy. Risk factors for coronary artery disease include diabetes mellitus, dyslipidemia, male sex, hypertension, sedentary lifestyle and tobacco exposure. Current diabetic treatment includes insulin injections and oral agent (monotherapy). He is compliant with treatment none of the time. His weight is increasing steadily. He is following a generally unhealthy diet. When asked about meal planning, he reported none. He has not had a previous visit with a dietitian. He never participates in exercise. (He missed his appointment since May 2020.  He is returning with A1c of 12.1%.  He did not bring any logs nor meter.  He claims to have been using Lantus 50-80 units twice daily and glimepiride 8 mg daily.  He denies hypoglycemia.   ) An ACE inhibitor/angiotensin II receptor blocker is being taken. Eye exam is current.  Hypertension This is a chronic problem. The current episode started more than 1 year ago. The problem is controlled. Associated symptoms include blurred vision. Pertinent negatives include no chest pain, headaches, neck pain, palpitations, shortness of breath or sweats. Risk factors for coronary artery disease include diabetes mellitus, dyslipidemia, male gender, obesity, smoking/tobacco exposure, sedentary lifestyle and  family history. Past treatments include angiotensin blockers. Hypertensive end-organ damage includes CAD/MI.  Hyperlipidemia This is a chronic problem. The current episode started more than 1 year ago. Exacerbating diseases include diabetes and obesity. Factors aggravating his hyperlipidemia include smoking. Pertinent negatives include no chest pain, myalgias or shortness of breath. Current antihyperlipidemic treatment includes statins. Risk factors for coronary artery disease include dyslipidemia, diabetes mellitus, hypertension, male sex, obesity and a sedentary lifestyle.   Review of Systems  Constitutional: Negative for chills, fatigue, fever and unexpected weight change.  HENT: Negative for dental problem, mouth sores and trouble swallowing.   Eyes: Positive for blurred vision. Negative for visual disturbance.  Respiratory: Negative for cough, choking, chest tightness, shortness of breath and wheezing.   Cardiovascular: Negative for chest pain, palpitations and leg swelling.  Gastrointestinal: Negative for abdominal distention, abdominal pain, constipation, diarrhea, nausea and vomiting.  Endocrine: Positive for polydipsia and polyuria. Negative for polyphagia.  Genitourinary: Negative for dysuria, flank  pain, hematuria and urgency.  Musculoskeletal: Negative for back pain, gait problem, myalgias and neck pain.  Skin: Negative for pallor, rash and wound.  Neurological: Negative for dizziness, tremors, seizures, syncope, weakness, numbness and headaches.  Psychiatric/Behavioral: Negative for confusion and dysphoric mood.    Objective:    BP (!) 110/50   Pulse 60   Ht 5\' 8"  (1.727 m)   Wt 237 lb 3.2 oz (107.6 kg)   BMI 36.07 kg/m   Wt Readings from Last 3 Encounters:  01/30/21 237 lb 3.2 oz (107.6 kg)  03/01/20 227 lb 9.6 oz (103.2 kg)  12/09/19 229 lb 6.4 oz (104.1 kg)     Physical Exam Constitutional:      General: He is not in acute distress.    Appearance: He is  well-developed.  HENT:     Head: Normocephalic and atraumatic.  Neck:     Thyroid: No thyromegaly.     Trachea: No tracheal deviation.  Cardiovascular:     Rate and Rhythm: Normal rate.     Pulses:          Dorsalis pedis pulses are 1+ on the right side and 1+ on the left side.       Posterior tibial pulses are 1+ on the right side and 1+ on the left side.     Heart sounds: S1 normal and S2 normal. No murmur heard. No gallop.   Pulmonary:     Effort: Pulmonary effort is normal. No respiratory distress.     Breath sounds: No wheezing.  Abdominal:     General: There is no distension.     Tenderness: There is no abdominal tenderness. There is no guarding.  Musculoskeletal:     Right shoulder: No swelling or deformity.     Cervical back: Normal range of motion and neck supple.  Skin:    General: Skin is warm and dry.     Findings: No rash.     Nails: There is no clubbing.  Neurological:     Mental Status: He is alert and oriented to person, place, and time.     Cranial Nerves: No cranial nerve deficit.     Sensory: No sensory deficit.     Gait: Gait normal.     Deep Tendon Reflexes: Reflexes are normal and symmetric.  Psychiatric:        Speech: Speech normal.        Behavior: Behavior normal. Behavior is cooperative.        Thought Content: Thought content normal.        Judgment: Judgment normal.     CMP     Component Value Date/Time   NA 137 05/25/2019 0254   NA 137 11/06/2015 0950   K 3.9 05/25/2019 0254   K 4.6 11/06/2015 0950   CL 103 05/25/2019 0254   CL 101 10/27/2012 1045   CO2 24 05/25/2019 0254   CO2 28 11/06/2015 0950   GLUCOSE 101 (H) 05/25/2019 0254   GLUCOSE 309 (H) 11/06/2015 0950   GLUCOSE 174 (H) 10/27/2012 1045   BUN 44 (A) 12/12/2020 0000   BUN 12.2 11/06/2015 0950   CREATININE 2.0 (A) 12/12/2020 0000   CREATININE 1.46 (H) 05/25/2019 0254   CREATININE 1.2 11/06/2015 0950   CALCIUM 9.6 12/12/2020 0000   CALCIUM 9.9 11/06/2015 0950   PROT 6.2  (L) 05/19/2019 1122   PROT 6.8 11/06/2015 0950   ALBUMIN 3.8 05/19/2019 1122   ALBUMIN 4.1 11/06/2015 0950   AST 21  05/19/2019 1122   AST 12 11/06/2015 0950   ALT 27 05/19/2019 1122   ALT 12 11/06/2015 0950   ALKPHOS 72 05/19/2019 1122   ALKPHOS 94 11/06/2015 0950   BILITOT 0.8 05/19/2019 1122   BILITOT 0.63 11/06/2015 0950   GFRNONAA 34 12/12/2020 0000   GFRAA 54 (L) 05/25/2019 0254     Diabetic Labs (most recent): Lab Results  Component Value Date   HGBA1C 12.1 12/12/2020   HGBA1C 9.8 (H) 05/19/2019   HGBA1C 8.3 01/25/2019    Lipid Panel     Component Value Date/Time   CHOL 100 12/12/2020 0000   TRIG 130 12/12/2020 0000   HDL 30 (A) 12/12/2020 0000   LDLCALC 69 12/12/2020 0000     Assessment & Plan:   1. Type 2 diabetes mellitus with stage 3 chronic kidney disease, with long-term current use of insulin (HCC)  - Zykeem G Blakeman has currently uncontrolled symptomatic type 2 DM since 75 years of age. He missed his appointment since May 2020.  He is returning with A1c of 12.1%.  He did not bring any logs nor meter.  He claims to have been using Lantus 50-80 units twice daily and glimepiride 8 mg daily.  He denies hypoglycemia.  His current motivation is only to get a prescription for his eyeglasses.  His previsit labs show A1c of 12.1% increasing from 8.3% during his last visit.  Recent labs reviewed. - I had a long discussion with him about the progressive nature of diabetes and the pathology behind its complications. -his diabetes is complicated by renal artery disease, CKD, random intermittent severe hypoglycemia and he remains at exceedingly high high risk for more acute and chronic complications which include CAD, CVA, CKD, retinopathy, and neuropathy. These are all discussed in detail with him.  - I have counseled him on diet management  by adopting a carbohydrate restricted/protein rich diet.  - he acknowledges that there is a room for improvement in his food and  drink choices. - Suggestion is made for him to avoid simple carbohydrates  from his diet including Cakes, Sweet Desserts, Ice Cream, Soda (diet and regular), Sweet Tea, Candies, Chips, Cookies, Store Bought Juices, Alcohol in Excess of  1-2 drinks a day, Artificial Sweeteners,  Coffee Creamer, and "Sugar-free" Products, Lemonade. This will help patient to have more stable blood glucose profile and potentially avoid unintended weight gain.   - I encouraged him to switch to  unprocessed or minimally processed complex starch and increased protein intake (animal or plant source), fruits, and vegetables.  - he is advised to stick to a routine mealtimes to eat 3 meals  a day and avoid unnecessary snacks ( to snack only to correct hypoglycemia).   - he will be scheduled with Jearld Fenton, RDN, CDE for individualized diabetes education.  - I have approached him with the following individualized plan to manage diabetes and patient agrees:   -In light of his current severe glycemic burden he will need insulin treatment likely multi daily injections in order for him to control diabetes to target.   -However, the #1 priority in the management of his diabetes still to avoid hypoglycemia.    -He is using more than he was told to during his last visit.  In preparation, he reports to start monitoring blood glucose 4 times a day before meals and at bedtime and return in 1 week with his meter and logs.    -In the meantime, he is advised to lower his  Lantus to 60 units nightly, discontinue glimepiride 8 units daily.  I discussed and added glipizide 5 mg XL p.o. daily at breakfast.    - he is warned not to take insulin without proper monitoring per orders.  - he is encouraged to call clinic for blood glucose levels less than 70 or above 300 mg /dl.  - he is not a candidate for metformin  nor SGLT2 inhibitors due to concurrent renal insufficiency.  - he will be considered for incretin therapy as appropriate  next visit.  - Patient specific target  A1c;  LDL, HDL, Triglycerides, and  Waist Circumference were discussed in detail.  2) Blood Pressure /Hypertension: His blood pressure is controlled to target.    he is advised to continue his current medications including losartan 50 mg p.o. daily with breakfast .  3) Lipids/Hyperlipidemia:   Review of his recent lipid panel showed  uncontrolled  LDL at 91 .  he  is advised to continue lovastatin 20 mg p.o. daily at bedtime. Side effects and precautions discussed with him.  4)  Weight/Diet:  Body mass index is 36.07 kg/m.  -   clearly complicating his diabetes care.  I discussed with him the fact that loss of 5 - 10% of his  current body weight will have the most impact on his diabetes management.  CDE Consult will be initiated . Exercise, and detailed carbohydrates information provided  -  detailed on discharge instructions.  5) Chronic Care/Health Maintenance:  -he  is on ACEI/ARB and Statin medications and  is encouraged to initiate and continue to follow up with Ophthalmology, Dentist,  Podiatrist at least yearly or according to recommendations, and advised to  stay away from smoking. I have recommended yearly flu vaccine and pneumonia vaccine at least every 5 years; moderate intensity exercise for up to 150 minutes weekly; and  sleep for at least 7 hours a day.  - he is  advised to maintain close follow up with Heywood Bene, PA-C for primary care needs, as well as his other providers for optimal and coordinated care.    I spent 45 minutes in the care of the patient today including review of labs from [place], Lipids, Thyroid Function, Hematology (current and previous including abstractions from other facilities); face-to-face time discussing  his blood glucose readings/logs, discussing hypoglycemia and hyperglycemia episodes and symptoms, medications doses, his options of short and long term treatment based on the latest standards of care /  guidelines;  discussion about incorporating lifestyle medicine;  and documenting the encounter.    Please refer to Patient Instructions for Blood Glucose Monitoring and Insulin/Medications Dosing Guide"  in media tab for additional information. Please  also refer to " Patient Self Inventory" in the Media  tab for reviewed elements of pertinent patient history.  Kym Groom Kimery participated in the discussions, expressed understanding, and voiced agreement with the above plans.  All questions were answered to his satisfaction. he is encouraged to contact clinic should he have any questions or concerns prior to his return visit.  Follow up plan: - Return in about 1 week (around 02/06/2021) for F/U with Meter and Logs Only - no Labs.  Glade Lloyd, MD [facility] [address] [place], [place] [address] Phone: [phone]  Fax: [phone]    01/30/2021, 12:46 PM  This note was partially dictated with voice recognition software. Similar sounding words can be transcribed inadequately or may not  be corrected upon review.

## 2021-02-07 ENCOUNTER — Ambulatory Visit: Payer: Medicare Other | Admitting: "Endocrinology

## 2021-02-07 ENCOUNTER — Encounter: Payer: Self-pay | Admitting: "Endocrinology

## 2021-02-07 ENCOUNTER — Other Ambulatory Visit: Payer: Self-pay

## 2021-02-07 VITALS — BP 98/62 | HR 60 | Ht 68.0 in | Wt 234.0 lb

## 2021-02-07 DIAGNOSIS — E782 Mixed hyperlipidemia: Secondary | ICD-10-CM

## 2021-02-07 DIAGNOSIS — Z794 Long term (current) use of insulin: Secondary | ICD-10-CM

## 2021-02-07 DIAGNOSIS — N183 Chronic kidney disease, stage 3 unspecified: Secondary | ICD-10-CM | POA: Diagnosis not present

## 2021-02-07 DIAGNOSIS — I10 Essential (primary) hypertension: Secondary | ICD-10-CM

## 2021-02-07 DIAGNOSIS — E1122 Type 2 diabetes mellitus with diabetic chronic kidney disease: Secondary | ICD-10-CM

## 2021-02-07 NOTE — Progress Notes (Signed)
02/07/2021, 1:11 PM  Endocrinology follow-up note   Subjective:    Patient ID: Cody Wood, male    DOB: Jul 03, 1946.  DIMARIO HAMMERSCHMIDT is being seen in follow-up after he was seen consultation for management of currently uncontrolled symptomatic diabetes in May 2020 requested by  Heywood Bene, PA-C. He disappeared and never came back for follow-up.  His current motivation is so he can get his eyeglasses.  Past Medical History:  Diagnosis Date  . Blockage of coronary artery of heart (HCC)    Nonobstructive CAD by cath 2012  . Chronic bronchitis ([place])   . Diabetes mellitus    metformin  . hodgkins lymphoma dx'd 06/2008  . Hypertension   . Peripheral vascular disease [facility])     Past Surgical History:  Procedure Laterality Date  . ABDOMINAL AORTOGRAM W/LOWER EXTREMITY N/A 05/12/2019   Procedure: ABDOMINAL AORTOGRAM W/LOWER EXTREMITY;  Surgeon: [name], MD;  Location: [facility];  Service: Cardiovascular;  Laterality: N/A;  . CERVICAL SPINE SURGERY  2003  . ENDARTERECTOMY FEMORAL Left 05/24/2019   Procedure: ENDARTERECTOMY FEMORAL;  Surgeon: [name], MD;  Location: [facility];  Service: Vascular;  Laterality: Left;  . KNEE SURGERY    . LEFT HEART CATHETERIZATION WITH CORONARY ANGIOGRAM N/A 09/03/2011   Procedure: LEFT HEART CATHETERIZATION WITH CORONARY ANGIOGRAM;  Surgeon: Laverda Page, MD;  Location: [facility] CATH LAB;  Service: Cardiovascular;  Laterality: N/A;  . MANDIBLE SURGERY    . NECK SURGERY    . PATCH ANGIOPLASTY Left 05/24/2019   Procedure: Patch Angioplasty Superficial Femoral Artery;  Surgeon: [name], MD;  Location: [facility] OR;  Service: Vascular;  Laterality: Left;    Social History   Socioeconomic History  . Marital status: Divorced    Spouse name: Not on file  . Number of children: Not on file  . Years of education: Not on file  . Highest  education level: Not on file  Occupational History  . Not on file  Tobacco Use  . Smoking status: Former Smoker    Packs/day: 2.00    Years: 40.00    Pack years: 80.00    Types: Cigarettes    Quit date: 11/05/1985    Years since quitting: 35.2  . Smokeless tobacco: Current User    Types: Chew  Vaping Use  . Vaping Use: Never used  Substance and Sexual Activity  . Alcohol use: No  . Drug use: No  . Sexual activity: Not on file  Other Topics Concern  . Not on file  Social History Narrative  . Not on file   Social Determinants of Health   Financial Resource Strain: Not on file  Food Insecurity: Not on file  Transportation Needs: Not on file  Physical Activity: Not on file  Stress: Not on file  Social Connections: Not on file    History reviewed. No pertinent family history.  Outpatient Encounter Medications as of 02/07/2021  Medication Sig  . aspirin 81 MG EC tablet Take 81 mg by mouth daily.   . carvedilol (COREG) 6.25 MG tablet Take 6.25 mg by mouth 2 (two) times daily.  Marland Kitchen glipiZIDE (GLUCOTROL  XL) 5 MG 24 hr tablet Take 1 tablet (5 mg total) by mouth daily with breakfast.  . LANTUS SOLOSTAR 100 UNIT/ML Solostar Pen Inject 50 Units into the skin at bedtime.  Marland Kitchen losartan (COZAAR) 50 MG tablet Take 50 mg by mouth daily. (Patient not taking: Reported on 01/30/2021)  . lovastatin (MEVACOR) 40 MG tablet Take 40 mg by mouth every evening.  . rosuvastatin (CRESTOR) 20 MG tablet Take 20 mg by mouth at bedtime.   No facility-administered encounter medications on file as of 02/07/2021.    ALLERGIES: No Known Allergies  VACCINATION STATUS: Immunization History  Administered Date(s) Administered  . Influenza, High Dose Seasonal PF 06/13/2014, 07/24/2016, 07/15/2017, 07/29/2018  . Moderna Sars-Covid-2 Vaccination 12/08/2019, 01/05/2020  . Pneumococcal Conjugate-13 06/13/2014  . Pneumococcal Polysaccharide-23 04/23/2012    Diabetes He presents for his follow-up diabetic visit.  He has type 2 diabetes mellitus. Onset time: He was diagnosed at approximate age of 75 years. His disease course has been improving. Pertinent negatives for hypoglycemia include no confusion, dizziness, headaches, hunger, pallor, seizures, sweats or tremors. Associated symptoms include blurred vision, polydipsia and polyuria. Pertinent negatives for diabetes include no chest pain, no fatigue, no polyphagia and no weakness. Pertinent negatives for hypoglycemia complications include no blackouts and no required assistance. Symptoms are improving. Diabetic complications include heart disease and nephropathy. Risk factors for coronary artery disease include diabetes mellitus, dyslipidemia, male sex, hypertension, sedentary lifestyle and tobacco exposure. Current diabetic treatment includes insulin injections and oral agent (monotherapy). He is compliant with treatment none of the time. His weight is fluctuating minimally. He is following a generally unhealthy diet. When asked about meal planning, he reported none. He has not had a previous visit with a dietitian. He never participates in exercise. His home blood glucose trend is decreasing steadily. His breakfast blood glucose range is generally 140-180 mg/dl. His lunch blood glucose range is generally 140-180 mg/dl. His dinner blood glucose range is generally 140-180 mg/dl. His bedtime blood glucose range is generally 140-180 mg/dl. His overall blood glucose range is 140-180 mg/dl. (After he was resumed on adjusted dose of basal insulin, he responds with controlled glycemic profile.  He presents with a log showing near target fasting and postprandial glycemic profile.  His recent A1c was 12.1%.  He did not document any hypoglycemia.) An ACE inhibitor/angiotensin II receptor blocker is being taken. Eye exam is current.  Hypertension This is a chronic problem. The current episode started more than 1 year ago. The problem is controlled. Associated symptoms include  blurred vision. Pertinent negatives include no chest pain, headaches, neck pain, palpitations, shortness of breath or sweats. Risk factors for coronary artery disease include diabetes mellitus, dyslipidemia, male gender, obesity, smoking/tobacco exposure, sedentary lifestyle and family history. Past treatments include angiotensin blockers. Hypertensive end-organ damage includes CAD/MI.  Hyperlipidemia This is a chronic problem. The current episode started more than 1 year ago. Exacerbating diseases include diabetes and obesity. Factors aggravating his hyperlipidemia include smoking. Pertinent negatives include no chest pain, myalgias or shortness of breath. Current antihyperlipidemic treatment includes statins. Risk factors for coronary artery disease include dyslipidemia, diabetes mellitus, hypertension, male sex, obesity and a sedentary lifestyle.   Review of Systems  Constitutional: Negative for chills, fatigue, fever and unexpected weight change.  HENT: Negative for dental problem, mouth sores and trouble swallowing.   Eyes: Positive for blurred vision. Negative for visual disturbance.  Respiratory: Negative for cough, choking, chest tightness, shortness of breath and wheezing.   Cardiovascular: Negative for chest  pain, palpitations and leg swelling.  Gastrointestinal: Negative for abdominal distention, abdominal pain, constipation, diarrhea, nausea and vomiting.  Endocrine: Positive for polydipsia and polyuria. Negative for polyphagia.  Genitourinary: Negative for dysuria, flank pain, hematuria and urgency.  Musculoskeletal: Negative for back pain, gait problem, myalgias and neck pain.  Skin: Negative for pallor, rash and wound.  Neurological: Negative for dizziness, tremors, seizures, syncope, weakness, numbness and headaches.  Psychiatric/Behavioral: Negative for confusion and dysphoric mood.    Objective:    BP 98/62   Pulse 60   Ht 5\' 8"  (1.727 m)   Wt 234 lb (106.1 kg)   BMI 35.58  kg/m   Wt Readings from Last 3 Encounters:  02/07/21 234 lb (106.1 kg)  01/30/21 237 lb 3.2 oz (107.6 kg)  03/01/20 227 lb 9.6 oz (103.2 kg)     Physical Exam Constitutional:      General: He is not in acute distress.    Appearance: He is well-developed.  HENT:     Head: Normocephalic and atraumatic.  Neck:     Thyroid: No thyromegaly.     Trachea: No tracheal deviation.  Cardiovascular:     Rate and Rhythm: Normal rate.     Pulses:          Dorsalis pedis pulses are 1+ on the right side and 1+ on the left side.       Posterior tibial pulses are 1+ on the right side and 1+ on the left side.     Heart sounds: S1 normal and S2 normal. No murmur heard. No gallop.   Pulmonary:     Effort: Pulmonary effort is normal. No respiratory distress.     Breath sounds: No wheezing.  Abdominal:     General: There is no distension.     Tenderness: There is no abdominal tenderness. There is no guarding.  Musculoskeletal:     Right shoulder: No swelling or deformity.     Cervical back: Normal range of motion and neck supple.  Skin:    General: Skin is warm and dry.     Findings: No rash.     Nails: There is no clubbing.  Neurological:     Mental Status: He is alert and oriented to person, place, and time.     Cranial Nerves: No cranial nerve deficit.     Sensory: No sensory deficit.     Gait: Gait normal.     Deep Tendon Reflexes: Reflexes are normal and symmetric.  Psychiatric:        Speech: Speech normal.        Behavior: Behavior normal. Behavior is cooperative.        Thought Content: Thought content normal.        Judgment: Judgment normal.     CMP     Component Value Date/Time   NA 137 05/25/2019 0254   NA 137 11/06/2015 0950   K 3.9 05/25/2019 0254   K 4.6 11/06/2015 0950   CL 103 05/25/2019 0254   CL 101 10/27/2012 1045   CO2 24 05/25/2019 0254   CO2 28 11/06/2015 0950   GLUCOSE 101 (H) 05/25/2019 0254   GLUCOSE 309 (H) 11/06/2015 0950   GLUCOSE 174 (H)  10/27/2012 1045   BUN 44 (A) 12/12/2020 0000   BUN 12.2 11/06/2015 0950   CREATININE 2.0 (A) 12/12/2020 0000   CREATININE 1.46 (H) 05/25/2019 0254   CREATININE 1.2 11/06/2015 0950   CALCIUM 9.6 12/12/2020 0000   CALCIUM 9.9 11/06/2015 0950  PROT 6.2 (L) 05/19/2019 1122   PROT 6.8 11/06/2015 0950   ALBUMIN 3.8 05/19/2019 1122   ALBUMIN 4.1 11/06/2015 0950   AST 21 05/19/2019 1122   AST 12 11/06/2015 0950   ALT 27 05/19/2019 1122   ALT 12 11/06/2015 0950   ALKPHOS 72 05/19/2019 1122   ALKPHOS 94 11/06/2015 0950   BILITOT 0.8 05/19/2019 1122   BILITOT 0.63 11/06/2015 0950   GFRNONAA 34 12/12/2020 0000   GFRAA 54 (L) 05/25/2019 0254     Diabetic Labs (most recent): Lab Results  Component Value Date   HGBA1C 12.1 12/12/2020   HGBA1C 9.8 (H) 05/19/2019   HGBA1C 8.3 01/25/2019    Lipid Panel     Component Value Date/Time   CHOL 100 12/12/2020 0000   TRIG 130 12/12/2020 0000   HDL 30 (A) 12/12/2020 0000   LDLCALC 69 12/12/2020 0000     Assessment & Plan:   1. Type 2 diabetes mellitus with stage 3 chronic kidney disease, with long-term current use of insulin (HCC)  - Kanton G Tiede has currently uncontrolled symptomatic type 2 DM since 75 years of age.  After he was resumed on adjusted dose of basal insulin, he responds with controlled glycemic profile.  He presents with a log showing near target fasting and postprandial glycemic profile.  His recent A1c was 12.1%.  He did not document any hypoglycemia.   Recent labs reviewed. - I had a long discussion with him about the progressive nature of diabetes and the pathology behind its complications. -his diabetes is complicated by renal artery disease, CKD, random intermittent severe hypoglycemia and he remains at exceedingly high high risk for more acute and chronic complications which include CAD, CVA, CKD, retinopathy, and neuropathy. These are all discussed in detail with him.  - I have counseled him on diet management   by adopting a carbohydrate restricted/protein rich diet.  - he acknowledges that there is a room for improvement in his food and drink choices. - Suggestion is made for him to avoid simple carbohydrates  from his diet including Cakes, Sweet Desserts, Ice Cream, Soda (diet and regular), Sweet Tea, Candies, Chips, Cookies, Store Bought Juices, Alcohol in Excess of  1-2 drinks a day, Artificial Sweeteners,  Coffee Creamer, and "Sugar-free" Products, Lemonade. This will help patient to have more stable blood glucose profile and potentially avoid unintended weight gain.   - I encouraged him to switch to  unprocessed or minimally processed complex starch and increased protein intake (animal or plant source), fruits, and vegetables.  - he is advised to stick to a routine mealtimes to eat 3 meals  a day and avoid unnecessary snacks ( to snack only to correct hypoglycemia).   - he will be scheduled with Jearld Fenton, RDN, CDE for individualized diabetes education.  - I have approached him with the following individualized plan to manage diabetes and patient agrees:   -In light of his ongoing glycemic burden, he will continue to need insulin treatment.  However, due to tightening glycemic profile he is advised to lower his Lantus to 50 units nightly associated with monitoring of blood glucose 4 times a day-before meals and at bedtime.   He will not need prandial insulin for now. -He has responded to glipizide, advised to continue glipizide 5 mg XL p.o. daily at breakfast.    - he is warned not to take insulin without proper monitoring per orders.  - he is encouraged to call clinic for blood glucose levels  less than 70 or above 300 mg /dl.  - he is not a candidate for metformin  nor SGLT2 inhibitors due to concurrent renal insufficiency.  - he will be considered for incretin therapy as appropriate next visit.  - Patient specific target  A1c;  LDL, HDL, Triglycerides, and  Waist Circumference were  discussed in detail.  2) Blood Pressure /Hypertension:  His blood pressure is controlled to target.    he is advised to continue his current medications including losartan 50 mg p.o. daily with breakfast .  3) Lipids/Hyperlipidemia:   Review of his recent lipid panel showed  uncontrolled  LDL at 91 .  he  is advised to continue lovastatin 20 mg p.o. daily at bedtime.   - Side effects and precautions discussed with him.  4)  Weight/Diet:  Body mass index is 35.58 kg/m.  -   clearly complicating his diabetes care.  I discussed with him the fact that loss of 5 - 10% of his  current body weight will have the most impact on his diabetes management.  CDE Consult will be initiated . Exercise, and detailed carbohydrates information provided  -  detailed on discharge instructions.  5) Chronic Care/Health Maintenance:  -he  is on ACEI/ARB and Statin medications and  is encouraged to initiate and continue to follow up with Ophthalmology, Dentist,  Podiatrist at least yearly or according to recommendations, and advised to  stay away from smoking. I have recommended yearly flu vaccine and pneumonia vaccine at least every 5 years; moderate intensity exercise for up to 150 minutes weekly; and  sleep for at least 7 hours a day.  - he is  advised to maintain close follow up with Heywood Bene, PA-C for primary care needs, as well as his other providers for optimal and coordinated care.   I spent 42 minutes in the care of the patient today including review of labs from [place], Lipids, Thyroid Function, Hematology (current and previous including abstractions from other facilities); face-to-face time discussing  his blood glucose readings/logs, discussing hypoglycemia and hyperglycemia episodes and symptoms, medications doses, his options of short and long term treatment based on the latest standards of care / guidelines;  discussion about incorporating lifestyle medicine;  and documenting the encounter.     Please refer to Patient Instructions for Blood Glucose Monitoring and Insulin/Medications Dosing Guide"  in media tab for additional information. Please  also refer to " Patient Self Inventory" in the Media  tab for reviewed elements of pertinent patient history.  Kym Groom Braatz participated in the discussions, expressed understanding, and voiced agreement with the above plans.  All questions were answered to his satisfaction. he is encouraged to contact clinic should he have any questions or concerns prior to his return visit.  Follow up plan: - Return in about 5 weeks (around 03/15/2021) for Bring Meter and Logs- A1c in Office.  Glade Lloyd, MD [facility] [address] [place], [place] [address] Phone: [phone]  Fax: [phone]    02/07/2021, 1:11 PM  This note was partially dictated with voice recognition software. Similar sounding words can be transcribed inadequately or may not  be corrected upon review.

## 2021-02-07 NOTE — Patient Instructions (Signed)

## 2021-03-19 ENCOUNTER — Ambulatory Visit: Payer: Medicare Other | Admitting: "Endocrinology

## 2021-04-19 ENCOUNTER — Ambulatory Visit (HOSPITAL_BASED_OUTPATIENT_CLINIC_OR_DEPARTMENT_OTHER)
Admission: RE | Admit: 2021-04-19 | Discharge: 2021-04-19 | Disposition: A | Discharge status: Home / Self Care | Payer: Medicare Other | Source: Ambulatory Visit | Attending: Physician Assistant | Admitting: Physician Assistant

## 2021-04-19 ENCOUNTER — Other Ambulatory Visit: Payer: Self-pay

## 2021-04-19 ENCOUNTER — Other Ambulatory Visit (HOSPITAL_BASED_OUTPATIENT_CLINIC_OR_DEPARTMENT_OTHER): Payer: Self-pay | Admitting: Physician Assistant

## 2021-04-19 ENCOUNTER — Other Ambulatory Visit (HOSPITAL_BASED_OUTPATIENT_CLINIC_OR_DEPARTMENT_OTHER): Payer: Self-pay

## 2021-04-19 DIAGNOSIS — M7989 Other specified soft tissue disorders: Secondary | ICD-10-CM | POA: Diagnosis present

## 2021-04-26 ENCOUNTER — Other Ambulatory Visit: Payer: Self-pay | Admitting: Physician Assistant

## 2021-04-26 DIAGNOSIS — M7989 Other specified soft tissue disorders: Secondary | ICD-10-CM

## 2021-04-26 DIAGNOSIS — I872 Venous insufficiency (chronic) (peripheral): Secondary | ICD-10-CM

## 2021-05-09 ENCOUNTER — Other Ambulatory Visit: Payer: Self-pay

## 2021-05-09 ENCOUNTER — Ambulatory Visit
Admission: RE | Admit: 2021-05-09 | Discharge: 2021-05-09 | Disposition: A | Discharge status: Home / Self Care | Payer: Medicare Other | Source: Ambulatory Visit | Attending: Physician Assistant | Admitting: Physician Assistant

## 2021-05-09 DIAGNOSIS — I872 Venous insufficiency (chronic) (peripheral): Secondary | ICD-10-CM

## 2021-05-09 DIAGNOSIS — M7989 Other specified soft tissue disorders: Secondary | ICD-10-CM

## 2021-05-10 ENCOUNTER — Ambulatory Visit: Payer: Medicare Other | Admitting: Podiatry

## 2021-05-21 ENCOUNTER — Encounter: Payer: Self-pay | Admitting: "Endocrinology

## 2021-05-21 ENCOUNTER — Other Ambulatory Visit: Payer: Self-pay

## 2021-05-21 ENCOUNTER — Ambulatory Visit (INDEPENDENT_AMBULATORY_CARE_PROVIDER_SITE_OTHER): Payer: Medicare Other | Admitting: "Endocrinology

## 2021-05-21 VITALS — BP 110/56 | HR 60 | Ht 68.0 in | Wt 234.8 lb

## 2021-05-21 DIAGNOSIS — N183 Chronic kidney disease, stage 3 unspecified: Secondary | ICD-10-CM | POA: Diagnosis not present

## 2021-05-21 DIAGNOSIS — Z794 Long term (current) use of insulin: Secondary | ICD-10-CM

## 2021-05-21 DIAGNOSIS — I10 Essential (primary) hypertension: Secondary | ICD-10-CM | POA: Diagnosis not present

## 2021-05-21 DIAGNOSIS — E1122 Type 2 diabetes mellitus with diabetic chronic kidney disease: Secondary | ICD-10-CM | POA: Diagnosis not present

## 2021-05-21 DIAGNOSIS — E782 Mixed hyperlipidemia: Secondary | ICD-10-CM

## 2021-05-21 DIAGNOSIS — Z9119 Patient's noncompliance with other medical treatment and regimen: Secondary | ICD-10-CM | POA: Diagnosis not present

## 2021-05-21 DIAGNOSIS — Z91199 Patient's noncompliance with other medical treatment and regimen due to unspecified reason: Secondary | ICD-10-CM

## 2021-05-21 LAB — POCT GLYCOSYLATED HEMOGLOBIN (HGB A1C): HbA1c, POC (controlled diabetic range): 11.5 % — AB (ref 0.0–7.0)

## 2021-05-21 MED ORDER — GLIPIZIDE ER 5 MG PO TB24: 5.0000 mg | ORAL_TABLET | Freq: Every day | ORAL | 1 refills | Status: DC

## 2021-05-21 NOTE — Progress Notes (Signed)
05/21/2021, 12:38 PM  Endocrinology follow-up note   Subjective:    Patient ID: Cody Wood, male    DOB: 1946/05/08.  Cody Wood is being seen in follow-up after he was seen consultation for management of currently uncontrolled symptomatic diabetes in May 2020 requested by  Heywood Bene, PA-C. He disappeared and never came back for follow-up.  His current motivation is so he can get his eyeglasses.  Past Medical History:  Diagnosis Date   Blockage of coronary artery of heart (HCC)    Nonobstructive CAD by cath 2012   Chronic bronchitis ([place])    Diabetes mellitus    metformin   hodgkins lymphoma dx'd 06/2008   Hypertension    Peripheral vascular disease ([place])     Past Surgical History:  Procedure Laterality Date   ABDOMINAL AORTOGRAM W/LOWER EXTREMITY N/A 05/12/2019   Procedure: ABDOMINAL AORTOGRAM W/LOWER EXTREMITY;  Surgeon: [name], MD;  Location: [place] [facility];  Service: Cardiovascular;  Laterality: N/A;   CERVICAL SPINE SURGERY  2003   ENDARTERECTOMY FEMORAL Left 05/24/2019   Procedure: ENDARTERECTOMY FEMORAL;  Surgeon: [name], MD;  Location: [place];  Service: Vascular;  Laterality: Left;   KNEE SURGERY     LEFT HEART CATHETERIZATION WITH CORONARY ANGIOGRAM N/A 09/03/2011   Procedure: LEFT HEART CATHETERIZATION WITH CORONARY ANGIOGRAM;  Surgeon: Laverda Page, MD;  Location: [facility] CATH [facility];  Service: Cardiovascular;  Laterality: N/A;   MANDIBLE SURGERY     NECK SURGERY     PATCH ANGIOPLASTY Left 05/24/2019   Procedure: Patch Angioplasty Superficial Femoral Artery;  Surgeon: [name], MD;  Location: [facility] OR;  Service: Vascular;  Laterality: Left;    Social History   Socioeconomic History   Marital status: Divorced    Spouse name: Not on file   Number of children: Not on file   Years of education: Not on file   Highest education level:  Not on file  Occupational History   Not on file  Tobacco Use   Smoking status: Former    Packs/day: 2.00    Years: 40.00    Pack years: 80.00    Types: Cigarettes    Quit date: 11/05/1985    Years since quitting: 35.5   Smokeless tobacco: Current    Types: Chew  Vaping Use   Vaping Use: Never used  Substance and Sexual Activity   Alcohol use: No   Drug use: No   Sexual activity: Not on file  Other Topics Concern   Not on file  Social History Narrative   Not on file   Social Determinants of Health   Financial Resource Strain: Not on file  Food Insecurity: Not on file  Transportation Needs: Not on file  Physical Activity: Not on file  Stress: Not on file  Social Connections: Not on file    History reviewed. No pertinent family history.  Outpatient Encounter Medications as of 05/21/2021  Medication Sig   losartan (COZAAR) 50 MG tablet Take 50 mg by mouth daily.   aspirin 81 MG EC tablet Take 81 mg by mouth daily.    carvedilol (COREG) 6.25 MG tablet Take 6.25  mg by mouth 2 (two) times daily.   glipiZIDE (GLUCOTROL XL) 5 MG 24 hr tablet Take 1 tablet (5 mg total) by mouth daily with breakfast.   LANTUS SOLOSTAR 100 UNIT/ML Solostar Pen Inject 60 Units into the skin at bedtime.   lovastatin (MEVACOR) 40 MG tablet Take 40 mg by mouth every evening.   rosuvastatin (CRESTOR) 20 MG tablet Take 20 mg by mouth at bedtime.   [DISCONTINUED] glipiZIDE (GLUCOTROL XL) 5 MG 24 hr tablet Take 1 tablet (5 mg total) by mouth daily with breakfast.   No facility-administered encounter medications on file as of 05/21/2021.    ALLERGIES: No Known Allergies  VACCINATION STATUS: Immunization History  Administered Date(s) Administered   Influenza, High Dose Seasonal PF 06/13/2014, 07/24/2016, 07/15/2017, 07/29/2018   Moderna Sars-Covid-2 Vaccination 12/08/2019, 01/05/2020   Pneumococcal Conjugate-13 06/13/2014   Pneumococcal Polysaccharide-23 04/23/2012    Diabetes He presents for his  follow-up diabetic visit. He has type 2 diabetes mellitus. Onset time: He was diagnosed at approximate age of 74 years. His disease course has been worsening. Pertinent negatives for hypoglycemia include no confusion, dizziness, headaches, hunger, pallor, seizures, sweats or tremors. Associated symptoms include blurred vision, polydipsia and polyuria. Pertinent negatives for diabetes include no chest pain, no fatigue, no polyphagia and no weakness. Pertinent negatives for hypoglycemia complications include no blackouts and no required assistance. Symptoms are worsening. Diabetic complications include heart disease and nephropathy. Risk factors for coronary artery disease include diabetes mellitus, dyslipidemia, male sex, hypertension, sedentary lifestyle and tobacco exposure. Current diabetic treatment includes insulin injections and oral agent (monotherapy). He is compliant with treatment none of the time. His weight is fluctuating minimally. He is following a generally unhealthy diet. When asked about meal planning, he reported none. He has not had a previous visit with a dietitian. He never participates in exercise. His home blood glucose trend is decreasing steadily. His breakfast blood glucose range is generally >200 mg/dl. His overall blood glucose range is >200 mg/dl. (He presents with meter with incomplete monitoring.  He only has 2 readings the last 7 days averaging 277, 6 readings the last 2 weeks averaging 280.  His point-of-care A1c is 11.5%, slightly improving from 12.1% during his last visit.  He did not document any hypoglycemia.   ) An ACE inhibitor/angiotensin II receptor blocker is being taken. Eye exam is current.  Hypertension This is a chronic problem. The current episode started more than 1 year ago. The problem is controlled. Associated symptoms include blurred vision. Pertinent negatives include no chest pain, headaches, neck pain, palpitations, shortness of breath or sweats. Risk factors  for coronary artery disease include diabetes mellitus, dyslipidemia, male gender, obesity, smoking/tobacco exposure, sedentary lifestyle and family history. Past treatments include angiotensin blockers. Hypertensive end-organ damage includes CAD/MI.  Hyperlipidemia This is a chronic problem. The current episode started more than 1 year ago. Exacerbating diseases include diabetes and obesity. Factors aggravating his hyperlipidemia include smoking. Pertinent negatives include no chest pain, myalgias or shortness of breath. Current antihyperlipidemic treatment includes statins. Risk factors for coronary artery disease include dyslipidemia, diabetes mellitus, hypertension, male sex, obesity and a sedentary lifestyle.  Review of Systems  Constitutional:  Negative for chills, fatigue, fever and unexpected weight change.  HENT:  Negative for dental problem, mouth sores and trouble swallowing.   Eyes:  Positive for blurred vision. Negative for visual disturbance.  Respiratory:  Negative for cough, choking, chest tightness, shortness of breath and wheezing.   Cardiovascular:  Negative for chest pain,  palpitations and leg swelling.  Gastrointestinal:  Negative for abdominal distention, abdominal pain, constipation, diarrhea, nausea and vomiting.  Endocrine: Positive for polydipsia and polyuria. Negative for polyphagia.  Genitourinary:  Negative for dysuria, flank pain, hematuria and urgency.  Musculoskeletal:  Negative for back pain, gait problem, myalgias and neck pain.  Skin:  Negative for pallor, rash and wound.  Neurological:  Negative for dizziness, tremors, seizures, syncope, weakness, numbness and headaches.  Psychiatric/Behavioral:  Negative for confusion and dysphoric mood.    Objective:    BP (!) 110/56   Pulse 60   Ht '5\' 8"'$  (1.727 m)   Wt 234 lb 12.8 oz (106.5 kg)   BMI 35.70 kg/m   Wt Readings from Last 3 Encounters:  05/21/21 234 lb 12.8 oz (106.5 kg)  02/07/21 234 lb (106.1 kg)   01/30/21 237 lb 3.2 oz (107.6 kg)     Physical Exam Constitutional:      General: He is not in acute distress.    Appearance: He is well-developed.  HENT:     Head: Normocephalic and atraumatic.  Neck:     Thyroid: No thyromegaly.     Trachea: No tracheal deviation.  Cardiovascular:     Rate and Rhythm: Normal rate.     Pulses:          Dorsalis pedis pulses are 1+ on the right side and 1+ on the left side.       Posterior tibial pulses are 1+ on the right side and 1+ on the left side.     Heart sounds: S1 normal and S2 normal. No murmur heard.   No gallop.  Pulmonary:     Effort: Pulmonary effort is normal. No respiratory distress.     Breath sounds: No wheezing.  Abdominal:     General: There is no distension.     Tenderness: There is no abdominal tenderness. There is no guarding.  Musculoskeletal:     Right shoulder: No swelling or deformity.     Cervical back: Normal range of motion and neck supple.  Skin:    General: Skin is warm and dry.     Findings: No rash.     Nails: There is no clubbing.  Neurological:     Mental Status: He is alert and oriented to person, place, and time.     Cranial Nerves: No cranial nerve deficit.     Sensory: No sensory deficit.     Gait: Gait normal.     Deep Tendon Reflexes: Reflexes are normal and symmetric.  Psychiatric:        Speech: Speech normal.        Behavior: Behavior normal. Behavior is cooperative.        Thought Content: Thought content normal.        Judgment: Judgment normal.    CMP     Component Value Date/Time   NA 137 05/25/2019 0254   NA 137 11/06/2015 0950   K 3.9 05/25/2019 0254   K 4.6 11/06/2015 0950   CL 103 05/25/2019 0254   CL 101 10/27/2012 1045   CO2 24 05/25/2019 0254   CO2 28 11/06/2015 0950   GLUCOSE 101 (H) 05/25/2019 0254   GLUCOSE 309 (H) 11/06/2015 0950   GLUCOSE 174 (H) 10/27/2012 1045   BUN 44 (A) 12/12/2020 0000   BUN 12.2 11/06/2015 0950   CREATININE 2.0 (A) 12/12/2020 0000    CREATININE 1.46 (H) 05/25/2019 0254   CREATININE 1.2 11/06/2015 0950   CALCIUM 9.6  12/12/2020 0000   CALCIUM 9.9 11/06/2015 0950   PROT 6.2 (L) 05/19/2019 1122   PROT 6.8 11/06/2015 0950   ALBUMIN 3.8 05/19/2019 1122   ALBUMIN 4.1 11/06/2015 0950   AST 21 05/19/2019 1122   AST 12 11/06/2015 0950   ALT 27 05/19/2019 1122   ALT 12 11/06/2015 0950   ALKPHOS 72 05/19/2019 1122   ALKPHOS 94 11/06/2015 0950   BILITOT 0.8 05/19/2019 1122   BILITOT 0.63 11/06/2015 0950   GFRNONAA 34 12/12/2020 0000   GFRAA 54 (L) 05/25/2019 0254     Diabetic Labs (most recent): Lab Results  Component Value Date   HGBA1C 11.5 (A) 05/21/2021   HGBA1C 12.1 12/12/2020   HGBA1C 9.8 (H) 05/19/2019    Lipid Panel     Component Value Date/Time   CHOL 100 12/12/2020 0000   TRIG 130 12/12/2020 0000   HDL 30 (A) 12/12/2020 0000   LDLCALC 69 12/12/2020 0000     Assessment & Plan:   1. Type 2 diabetes mellitus with stage 3 chronic kidney disease, with long-term current use of insulin (HCC)  - Cody Wood has currently uncontrolled symptomatic type 2 DM since 75 years of age.  He presents with meter with incomplete monitoring.  He only has 2 readings the last 7 days averaging 277, 6 readings the last 2 weeks averaging 280.  His point-of-care A1c is 11.5%, slightly improving from 12.1% during his last visit.  He did not document any hypoglycemia.     Recent labs reviewed. - I had a long discussion with him about the progressive nature of diabetes and the pathology behind its complications. -his diabetes is complicated by renal artery disease, CKD, random intermittent severe hypoglycemia and he remains at exceedingly high high risk for more acute and chronic complications which include CAD, CVA, CKD, retinopathy, and neuropathy. These are all discussed in detail with him.  - I have counseled him on diet management  by adopting a carbohydrate restricted/protein rich diet.  - he acknowledges that there  is a room for improvement in his food and drink choices. - Suggestion is made for him to avoid simple carbohydrates  from his diet including Cakes, Sweet Desserts, Ice Cream, Soda (diet and regular), Sweet Tea, Candies, Chips, Cookies, Store Bought Juices, Alcohol in Excess of  1-2 drinks a day, Artificial Sweeteners,  Coffee Creamer, and "Sugar-free" Products, Lemonade. This will help patient to have more stable blood glucose profile and potentially avoid unintended weight gain.   - I encouraged him to switch to  unprocessed or minimally processed complex starch and increased protein intake (animal or plant source), fruits, and vegetables.  - he is advised to stick to a routine mealtimes to eat 3 meals  a day and avoid unnecessary snacks ( to snack only to correct hypoglycemia).   - he will be scheduled with Jearld Fenton, RDN, CDE for individualized diabetes education.  - I have approached him with the following individualized plan to manage diabetes and patient agrees:   -In light of his ongoing glycemic burden, he will have benefited from multiple daily injections of insulin, however this patient is unable to execute MDI.  He will be considered for premixed insulin after his next visit.  In the meantime, he is advised to increase his Lantus to 60 units nightly, advised to start monitoring blood glucose at least twice a day-daily before breakfast and at bedtime.   - He  is advised to call clinic for hypoglycemia below 70  or persistent hyperglycemia above 200 mg per DL.  -He has responded to glipizide, advised to continue glipizide 5 mg XL p.o. daily at breakfast.    - he is warned not to take insulin without proper monitoring per orders.  - he is not a candidate for metformin  nor SGLT2 inhibitors due to concurrent renal insufficiency.  - he will be considered for incretin therapy as appropriate next visit.  - Patient specific target  A1c;  LDL, HDL, Triglycerides, and  Waist  Circumference were discussed in detail.  2) Blood Pressure /Hypertension:  -His blood pressure is controlled to target.  he is advised to continue his current medications including losartan 50 mg p.o. daily with breakfast .  3) Lipids/Hyperlipidemia:   Review of his recent lipid panel showed  uncontrolled  LDL at 91 .  he  is advised to continue lovastatin 20 mg p.o. daily at bedtime.   - Side effects and precautions discussed with him.  4)  Weight/Diet:  Body mass index is 35.7 kg/m.  -   clearly complicating his diabetes care.  I discussed with him the fact that loss of 5 - 10% of his  current body weight will have the most impact on his diabetes management.  CDE Consult will be initiated . Exercise, and detailed carbohydrates information provided  -  detailed on discharge instructions.  5) Chronic Care/Health Maintenance:  -he  is on ACEI/ARB and Statin medications and  is encouraged to initiate and continue to follow up with Ophthalmology, Dentist,  Podiatrist at least yearly or according to recommendations, and advised to  stay away from smoking. I have recommended yearly flu vaccine and pneumonia vaccine at least every 5 years; moderate intensity exercise for up to 150 minutes weekly; and  sleep for at least 7 hours a day.  - he is  advised to maintain close follow up with Heywood Bene, PA-C for primary care needs, as well as his other providers for optimal and coordinated care.   I spent 35 minutes in the care of the patient today including review of labs from [place], Lipids, Thyroid Function, Hematology (current and previous including abstractions from other facilities); face-to-face time discussing  his blood glucose readings/logs, discussing hypoglycemia and hyperglycemia episodes and symptoms, medications doses, his options of short and long term treatment based on the latest standards of care / guidelines;  discussion about incorporating lifestyle medicine;  and documenting the  encounter.    Please refer to Patient Instructions for Blood Glucose Monitoring and Insulin/Medications Dosing Guide"  in media tab for additional information. Please  also refer to " Patient Self Inventory" in the Media  tab for reviewed elements of pertinent patient history.  Kym Groom Serda participated in the discussions, expressed understanding, and voiced agreement with the above plans.  All questions were answered to his satisfaction. he is encouraged to contact clinic should he have any questions or concerns prior to his return visit.   Follow up plan: - Return in about 3 months (around 08/21/2021) for F/U with Pre-visit Labs, Meter, Logs, A1c here.Glade Lloyd, MD [facility] [address] [place], [place] [address] Phone: [phone]  Fax: [phone]    05/21/2021, 12:38 PM  This note was partially dictated with voice recognition software. Similar sounding words can be transcribed inadequately or may not  be corrected upon review.

## 2021-05-21 NOTE — Patient Instructions (Signed)

## 2021-08-22 ENCOUNTER — Ambulatory Visit: Payer: Medicare Other | Admitting: "Endocrinology

## 2021-12-19 LAB — COMPREHENSIVE METABOLIC PANEL
ALT: 11 IU/L (ref 0–44)
AST: 14 IU/L (ref 0–40)
Albumin/Globulin Ratio: 2.7 — ABNORMAL HIGH (ref 1.2–2.2)
Albumin: 4.3 g/dL (ref 3.7–4.7)
Alkaline Phosphatase: 108 IU/L (ref 44–121)
BUN/Creatinine Ratio: 20 (ref 10–24)
BUN: 33 mg/dL — ABNORMAL HIGH (ref 8–27)
Bilirubin Total: 0.3 mg/dL (ref 0.0–1.2)
CO2: 21 mmol/L (ref 20–29)
Calcium: 9.2 mg/dL (ref 8.6–10.2)
Chloride: 97 mmol/L (ref 96–106)
Creatinine, Ser: 1.67 mg/dL — ABNORMAL HIGH (ref 0.76–1.27)
Globulin, Total: 1.6 g/dL (ref 1.5–4.5)
Glucose: 238 mg/dL — ABNORMAL HIGH (ref 70–99)
Potassium: 5 mmol/L (ref 3.5–5.2)
Sodium: 134 mmol/L (ref 134–144)
Total Protein: 5.9 g/dL — ABNORMAL LOW (ref 6.0–8.5)
eGFR: 42 mL/min/{1.73_m2} — ABNORMAL LOW (ref >59)

## 2021-12-19 LAB — T4, FREE: Free T4: 1.46 ng/dL (ref 0.82–1.77)

## 2021-12-19 LAB — TSH: TSH: 1.36 u[IU]/mL (ref 0.450–4.500)

## 2021-12-19 LAB — VITAMIN D 25 HYDROXY (VIT D DEFICIENCY, FRACTURES): Vit D, 25-Hydroxy: 12.9 ng/mL — ABNORMAL LOW (ref 30.0–100.0)

## 2021-12-20 ENCOUNTER — Ambulatory Visit (INDEPENDENT_AMBULATORY_CARE_PROVIDER_SITE_OTHER): Payer: Medicare Other | Admitting: Pulmonary Disease

## 2021-12-20 ENCOUNTER — Other Ambulatory Visit: Payer: Self-pay

## 2021-12-20 ENCOUNTER — Encounter: Payer: Self-pay | Admitting: Pulmonary Disease

## 2021-12-20 VITALS — BP 128/64 | HR 60 | Temp 98.6°F | Ht 67.0 in | Wt 242.0 lb

## 2021-12-20 DIAGNOSIS — Z794 Long term (current) use of insulin: Secondary | ICD-10-CM | POA: Diagnosis not present

## 2021-12-20 DIAGNOSIS — N183 Chronic kidney disease, stage 3 unspecified: Secondary | ICD-10-CM | POA: Diagnosis not present

## 2021-12-20 DIAGNOSIS — G4733 Obstructive sleep apnea (adult) (pediatric): Secondary | ICD-10-CM | POA: Diagnosis not present

## 2021-12-20 DIAGNOSIS — E1122 Type 2 diabetes mellitus with diabetic chronic kidney disease: Secondary | ICD-10-CM

## 2021-12-20 NOTE — Assessment & Plan Note (Signed)
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 ?We will refer him to renal in [place] ?

## 2021-12-20 NOTE — Patient Instructions (Signed)
?  X Repeat HST @ [place] ? ?Based on this , we will restart CPAP at a lower pressure ? ?X referral to Nephrology - @ [place] ? ? ?

## 2021-12-20 NOTE — Progress Notes (Signed)
? ?  Subjective:  ? ? Patient ID: Cody Wood, male    DOB: Aug 02, 1946, 76 y.o.   MRN: [id number] ? 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  ?He smoked about 2 packs/day for 30 years before he quit in 1995 ?  ?PMH -  ?PAD (peripheral artery disease)  ? 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 Stage 3b chronic kidney disease   cr 1.8 ?Hodgkin's lymphoma - 2003 ?neck surgery in 2003 which led to disability ? ?Chief Complaint  ?Patient presents with  ? Follow-up  ?  Pt states he turned cpap machine in because it was too much pressure. Pt states he sleeps ok since not having the machine   ? ?Accompanied by his daughter-in-law Cody Wood who corroborates history. ?We undertook testing for OSA in 2021 and prescribed a new CPAP machine.  He could not tolerate the high pressure and without calling is turned in the machine. ?I have reviewed PCP report and endocrine consultation ?He has several problems since then ?-Renal function is worsening ?-Bilateral lower leg swelling , duplex negative ?-Uncontrolled diabetes with HbA1c 12 ?- right arm swelling ? ?He has been taking diuretics?  Compliance ? ?He is willing to retrial CPAP, he sleeps in a recliner for the last couple of years and daughter last witnessed apneas and loud snoring ? ? ?  ?Significant tests/ events reviewed ?  ?12/2019 HST severe sleep apnea-AHI 37/hour ?01/2020 CPAP titration >> 17 cm , mild centrals CAI 7/h ? ?CT chest 10/2019 clear lungs ? ? ?Review of Systems ?neg for any significant sore throat, dysphagia, itching, sneezing, nasal congestion or excess/ purulent secretions, fever, chills, sweats, unintended wt loss, pleuritic or exertional cp, hempoptysis, orthopnea pnd or change in chronic leg swelling. Also denies presyncope, palpitations, heartburn, abdominal pain, nausea, vomiting, diarrhea or change in bowel or urinary habits, dysuria,hematuria, rash, arthralgias, visual complaints, headache, numbness  weakness or ataxia. ? ?   ?Objective:  ? Physical Exam ? ?Gen. Pleasant, obese, in no distress ?ENT - no lesions, no post nasal drip ?Neck: No JVD, no thyromegaly, no carotid bruits ?Lungs: no use of accessory muscles, no dullness to percussion, decreased without rales or rhonchi  ?Cardiovascular: Rhythm regular, heart sounds  normal, no murmurs or gallops, 2+ peripheral edema ?Musculoskeletal: No deformities, no cyanosis or clubbing , no tremors , right arm edema ? ? ? ?   ?Assessment & Plan:  ? ? ?

## 2021-12-20 NOTE — Assessment & Plan Note (Addendum)
He has severe OSA with cardiovascular risk factors ?Since he had a break in therapy, we will retest with a home study, and based on this and then another CPAP prescription. ?Due to his prior intolerance of high pressures we will initiate auto CPAP at 5 to 15 cm, he seem to require up to 17 cm during his titration study ? ?Weight loss encouraged, compliance with goal of at least 4-6 hrs every night is the expectation. ?Advised against medications with sedative side effects ?Cautioned against driving when sleepy - understanding that sleepiness will vary on a day to day basis ? ?

## 2021-12-24 ENCOUNTER — Ambulatory Visit: Payer: Medicare Other | Admitting: "Endocrinology

## 2022-01-07 ENCOUNTER — Encounter: Payer: Self-pay | Admitting: "Endocrinology

## 2022-01-07 ENCOUNTER — Ambulatory Visit: Payer: Medicare Other | Admitting: "Endocrinology

## 2022-01-07 VITALS — BP 126/78 | HR 60 | Ht 67.0 in | Wt 244.4 lb

## 2022-01-07 DIAGNOSIS — N183 Chronic kidney disease, stage 3 unspecified: Secondary | ICD-10-CM | POA: Diagnosis not present

## 2022-01-07 DIAGNOSIS — Z91199 Patient's noncompliance with other medical treatment and regimen due to unspecified reason: Secondary | ICD-10-CM

## 2022-01-07 DIAGNOSIS — E782 Mixed hyperlipidemia: Secondary | ICD-10-CM

## 2022-01-07 DIAGNOSIS — E1122 Type 2 diabetes mellitus with diabetic chronic kidney disease: Secondary | ICD-10-CM

## 2022-01-07 DIAGNOSIS — Z794 Long term (current) use of insulin: Secondary | ICD-10-CM | POA: Diagnosis not present

## 2022-01-07 DIAGNOSIS — I10 Essential (primary) hypertension: Secondary | ICD-10-CM

## 2022-01-07 DIAGNOSIS — E559 Vitamin D deficiency, unspecified: Secondary | ICD-10-CM | POA: Insufficient documentation

## 2022-01-07 LAB — POCT GLYCOSYLATED HEMOGLOBIN (HGB A1C): HbA1c, POC (controlled diabetic range): 11.4 % — AB (ref 0.0–7.0)

## 2022-01-07 MED ORDER — NOVOLOG FLEXPEN 100 UNIT/ML ~~LOC~~ SOPN: 10.0000 [IU] | PEN_INJECTOR | Freq: Three times a day (TID) | SUBCUTANEOUS | 2 refills | Status: DC

## 2022-01-07 MED ORDER — VITAMIN D (ERGOCALCIFEROL) 1.25 MG (50000 UNIT) PO CAPS: 50000.0000 [IU] | ORAL_CAPSULE | ORAL | 0 refills | Status: DC

## 2022-01-07 NOTE — Patient Instructions (Signed)

## 2022-01-07 NOTE — Progress Notes (Signed)
? ?                                                  ?     01/07/2022, 3:01 PM ? ?Endocrinology follow-up note ? ? ?Subjective:  ? ? Patient ID: Cody Wood, male    DOB: 31-Mar-1946.  ?Cody Wood is being seen in follow-up after he was seen consultation for management of currently uncontrolled symptomatic diabetes in May 2020 requested by  Heywood Bene, PA-C. ?He disappeared and never came back for follow-up.  His current motivation is so he can get his eyeglasses. ? ?Past Medical History:  ?Diagnosis Date  ? Blockage of coronary artery of heart (HCC)   ? Nonobstructive CAD by cath 2012  ? Chronic bronchitis ([place])   ? Diabetes mellitus   ? metformin  ? hodgkins lymphoma dx'd 06/2008  ? Hypertension   ? Peripheral vascular disease ([place])   ? ? ?Past Surgical History:  ?Procedure Laterality Date  ? ABDOMINAL AORTOGRAM W/LOWER EXTREMITY N/A 05/12/2019  ? Procedure: ABDOMINAL AORTOGRAM W/LOWER EXTREMITY;  Surgeon: [name], MD;  Location: [facility];  Service: Cardiovascular;  Laterality: N/A;  ? CERVICAL SPINE SURGERY  2003  ? ENDARTERECTOMY FEMORAL Left 05/24/2019  ? Procedure: ENDARTERECTOMY FEMORAL;  Surgeon: [name], MD;  Location: [facility];  Service: Vascular;  Laterality: Left;  ? KNEE SURGERY    ? LEFT HEART CATHETERIZATION WITH CORONARY ANGIOGRAM N/A 09/03/2011  ? Procedure: LEFT HEART CATHETERIZATION WITH CORONARY ANGIOGRAM;  Surgeon: Laverda Page, MD;  Location: [facility] CATH [facility];  Service: Cardiovascular;  Laterality: N/A;  ? MANDIBLE SURGERY    ? NECK SURGERY    ? PATCH ANGIOPLASTY Left 05/24/2019  ? Procedure: Patch Angioplasty Superficial Femoral Artery;  Surgeon: [name], MD;  Location: [place];  Service: Vascular;  Laterality: Left;  ? ? ?Social History  ? ?Socioeconomic History  ? Marital status: Divorced  ?  Spouse name: Not on file  ? Number of children: Not on file  ? Years of education: Not on file  ? Highest education level:  Not on file  ?Occupational History  ? Not on file  ?Tobacco Use  ? Smoking status: Former  ?  Packs/day: 2.00  ?  Years: 40.00  ?  Pack years: 80.00  ?  Types: Cigarettes  ?  Quit date: 11/05/1985  ?  Years since quitting: 36.1  ? Smokeless tobacco: Current  ?  Types: Chew  ?Vaping Use  ? Vaping Use: Never used  ?Substance and Sexual Activity  ? Alcohol use: No  ? Drug use: No  ? Sexual activity: Not on file  ?Other Topics Concern  ? Not on file  ?Social History Narrative  ? Not on file  ? ?Social Determinants of Health  ? ?Financial Resource Strain: Not on file  ?Food Insecurity: Not on file  ?Transportation Needs: Not on file  ?Physical Activity: Not on file  ?Stress: Not on file  ?Social Connections: Not on file  ? ? ?History reviewed. No pertinent family history. ? ?Outpatient Encounter Medications as of 01/07/2022  ?Medication Sig  ? insulin aspart (NOVOLOG FLEXPEN) 100 UNIT/ML FlexPen Inject 10-16 Units into the skin 3 (three) times daily with meals.  ? Vitamin D, Ergocalciferol, (DRISDOL) 1.25 MG (50000 UNIT) CAPS capsule Take 1 capsule (  50,000 Units total) by mouth every 7 (seven) days.  ? aspirin 81 MG EC tablet Take 81 mg by mouth daily.   ? carvedilol (COREG) 6.25 MG tablet Take 6.25 mg by mouth 2 (two) times daily.  ? glipiZIDE (GLUCOTROL XL) 5 MG 24 hr tablet Take 1 tablet (5 mg total) by mouth daily with breakfast.  ? LANTUS SOLOSTAR 100 UNIT/ML Solostar Pen Inject 60 Units into the skin at bedtime.  ? lovastatin (MEVACOR) 40 MG tablet Take 40 mg by mouth every evening.  ? rosuvastatin (CRESTOR) 20 MG tablet Take 20 mg by mouth at bedtime.  ? [DISCONTINUED] losartan (COZAAR) 50 MG tablet Take 50 mg by mouth daily.  ? ?No facility-administered encounter medications on file as of 01/07/2022.  ? ? ?ALLERGIES: ?No Known Allergies ? ?VACCINATION STATUS: ?Immunization History  ?Administered Date(s) Administered  ? Influenza, High Dose Seasonal PF 06/13/2014, 07/24/2016, 07/15/2017, 07/29/2018  ? Moderna  Sars-Covid-2 Vaccination 12/08/2019, 01/05/2020  ? Pneumococcal Conjugate-13 06/13/2014  ? Pneumococcal Polysaccharide-23 04/23/2012  ? ? ?Diabetes ?He presents for his follow-up diabetic visit. He has type 2 diabetes mellitus. Onset time: He was diagnosed at approximate age of 51 years. His disease course has been worsening. Pertinent negatives for hypoglycemia include no confusion, dizziness, headaches, hunger, pallor, seizures, sweats or tremors. Associated symptoms include blurred vision, polydipsia and polyuria. Pertinent negatives for diabetes include no chest pain, no fatigue, no polyphagia and no weakness. Pertinent negatives for hypoglycemia complications include no blackouts and no required assistance. Symptoms are worsening. Diabetic complications include heart disease and nephropathy. Risk factors for coronary artery disease include diabetes mellitus, dyslipidemia, male sex, hypertension, sedentary lifestyle and tobacco exposure. Current diabetic treatment includes insulin injections and oral agent (monotherapy). He is compliant with treatment none of the time. His weight is fluctuating minimally. He is following a generally unhealthy diet. When asked about meal planning, he reported none. He has not had a previous visit with a dietitian. He never participates in exercise. His home blood glucose trend is increasing steadily. His overall blood glucose range is >200 mg/dl. (He missed his last appointment.  He is accompanied by his daughter-in-law [name] who is offering to help him.  He presents with a book showing rare blood glucose readings averaging 251-275 over the last 30 days.  He is monitoring blood glucose on average 1 time a day.   ?No hypoglycemia.  Point-of-care A1c is 11.4%. ?) An ACE inhibitor/angiotensin II receptor blocker is being taken. Eye exam is current.  ?Hypertension ?This is a chronic problem. The current episode started more than 1 year ago. The problem is controlled.  Associated symptoms include blurred vision. Pertinent negatives include no chest pain, headaches, neck pain, palpitations, shortness of breath or sweats. Risk factors for coronary artery disease include diabetes mellitus, dyslipidemia, male gender, obesity, smoking/tobacco exposure, sedentary lifestyle and family history. Past treatments include angiotensin blockers. Hypertensive end-organ damage includes CAD/MI.  ?Hyperlipidemia ?This is a chronic problem. The current episode started more than 1 year ago. Exacerbating diseases include diabetes and obesity. Factors aggravating his hyperlipidemia include smoking. Pertinent negatives include no chest pain, myalgias or shortness of breath. Current antihyperlipidemic treatment includes statins. Risk factors for coronary artery disease include dyslipidemia, diabetes mellitus, hypertension, male sex, obesity and a sedentary lifestyle.  ?Review of Systems  ?Constitutional:  Negative for chills, fatigue, fever and unexpected weight change.  ?HENT:  Negative for dental problem, mouth sores and trouble swallowing.   ?Eyes:  Positive for blurred vision. Negative for  visual disturbance.  ?Respiratory:  Negative for cough, choking, chest tightness, shortness of breath and wheezing.   ?Cardiovascular:  Negative for chest pain, palpitations and leg swelling.  ?Gastrointestinal:  Negative for abdominal distention, abdominal pain, constipation, diarrhea, nausea and vomiting.  ?Endocrine: Positive for polydipsia and polyuria. Negative for polyphagia.  ?Genitourinary:  Negative for dysuria, flank pain, hematuria and urgency.  ?Musculoskeletal:  Negative for back pain, gait problem, myalgias and neck pain.  ?Skin:  Negative for pallor, rash and wound.  ?Neurological:  Negative for dizziness, tremors, seizures, syncope, weakness, numbness and headaches.  ?Psychiatric/Behavioral:  Negative for confusion and dysphoric mood.   ? ?Objective:  ?  ?BP 126/78   Pulse 60   Ht '5\' 7"'$  (1.702 m)    Wt 244 lb 6.4 oz (110.9 kg)   BMI 38.28 kg/m?   ?Wt Readings from Last 3 Encounters:  ?01/07/22 244 lb 6.4 oz (110.9 kg)  ?12/20/21 242 lb (109.8 kg)  ?05/21/21 234 lb 12.8 oz (106.5 kg)  ?  ? ? ? ?CMP  ?   ?Component

## 2022-01-08 ENCOUNTER — Telehealth: Payer: Self-pay | Admitting: Pulmonary Disease

## 2022-01-08 NOTE — Telephone Encounter (Signed)
Called Rose and informed her that per Dr Angus Palms note that he was going to wait to order a new cpap machine for patient based on the home sleep study results. Some sleep study has not been scheduled yet at this time. Patient voiced understanding. Nothing further needed  ?

## 2022-01-16 ENCOUNTER — Other Ambulatory Visit: Payer: Self-pay | Admitting: "Endocrinology

## 2022-01-17 ENCOUNTER — Other Ambulatory Visit: Payer: Self-pay

## 2022-01-17 ENCOUNTER — Ambulatory Visit: Payer: Medicare Other | Admitting: "Endocrinology

## 2022-01-17 MED ORDER — INSULIN LISPRO (1 UNIT DIAL) 100 UNIT/ML (KWIKPEN): 10.0000 [IU] | PEN_INJECTOR | Freq: Three times a day (TID) | SUBCUTANEOUS | 1 refills | Status: DC

## 2022-01-25 ENCOUNTER — Encounter: Payer: Self-pay | Admitting: "Endocrinology

## 2022-01-25 ENCOUNTER — Ambulatory Visit (INDEPENDENT_AMBULATORY_CARE_PROVIDER_SITE_OTHER): Payer: Medicare Other | Admitting: "Endocrinology

## 2022-01-25 VITALS — BP 117/68 | HR 58 | Ht 67.0 in | Wt 243.2 lb

## 2022-01-25 DIAGNOSIS — E1122 Type 2 diabetes mellitus with diabetic chronic kidney disease: Secondary | ICD-10-CM

## 2022-01-25 DIAGNOSIS — I10 Essential (primary) hypertension: Secondary | ICD-10-CM

## 2022-01-25 DIAGNOSIS — E782 Mixed hyperlipidemia: Secondary | ICD-10-CM

## 2022-01-25 DIAGNOSIS — N183 Chronic kidney disease, stage 3 unspecified: Secondary | ICD-10-CM

## 2022-01-25 DIAGNOSIS — Z794 Long term (current) use of insulin: Secondary | ICD-10-CM

## 2022-01-25 DIAGNOSIS — Z91199 Patient's noncompliance with other medical treatment and regimen due to unspecified reason: Secondary | ICD-10-CM

## 2022-01-25 NOTE — Patient Instructions (Signed)

## 2022-01-25 NOTE — Progress Notes (Signed)
? ?                                                  ?     01/25/2022, 1:29 PM ? ?Endocrinology follow-up note ? ? ?Subjective:  ? ? Patient ID: Cody Wood, male    DOB: [birth year].  ?Cody Wood is being seen in follow-up after he was seen consultation for management of currently uncontrolled symptomatic diabetes in May 2020 requested by  Heywood Bene, PA-C. ?He disappeared and never came back for follow-up.  His current motivation is so he can get his eyeglasses. ? ?Past Medical History:  ?Diagnosis Date  ? Blockage of coronary artery of heart (HCC)   ? Nonobstructive CAD by cath 2012  ? Chronic bronchitis ([place])   ? Diabetes mellitus   ? metformin  ? hodgkins lymphoma dx'd 06/2008  ? Hypertension   ? Peripheral vascular disease ([place])   ? ? ?Past Surgical History:  ?Procedure Laterality Date  ? ABDOMINAL AORTOGRAM W/LOWER EXTREMITY N/A 05/12/2019  ? Procedure: ABDOMINAL AORTOGRAM W/LOWER EXTREMITY;  Surgeon: [name], MD;  Location: [facility];  Service: Cardiovascular;  Laterality: N/A;  ? CERVICAL SPINE SURGERY  2003  ? ENDARTERECTOMY FEMORAL Left 05/24/2019  ? Procedure: ENDARTERECTOMY FEMORAL;  Surgeon: [name], MD;  Location: [facility];  Service: Vascular;  Laterality: Left;  ? KNEE SURGERY    ? LEFT HEART CATHETERIZATION WITH CORONARY ANGIOGRAM N/A 09/03/2011  ? Procedure: LEFT HEART CATHETERIZATION WITH CORONARY ANGIOGRAM;  Surgeon: Laverda Page, MD;  Location: [facility] C[facility];  Service: Cardiovascular;  Laterality: N/A;  ? MANDIBLE SURGERY    ? NECK SURGERY    ? PATCH ANGIOPLASTY Left 05/24/2019  ? Procedure: Patch Angioplasty Superficial Femoral Artery;  Surgeon: [name], MD;  Location: [place] [place];  Service: Vascular;  Laterality: Left;  ? ? ?Social History  ? ?Socioeconomic History  ? Marital status: Divorced  ?  Spouse name: Not on file  ? Number of children: Not on file  ? Years of education: Not on file  ? Highest education level:  Not on file  ?Occupational History  ? Not on file  ?Tobacco Use  ? Smoking status: Former  ?  Packs/day: 2.00  ?  Years: 40.00  ?  Pack years: 80.00  ?  Types: Cigarettes  ?  Quit date: 11/05/1985  ?  Years since quitting: 36.2  ? Smokeless tobacco: Current  ?  Types: Chew  ?Vaping Use  ? Vaping Use: Never used  ?Substance and Sexual Activity  ? Alcohol use: No  ? Drug use: No  ? Sexual activity: Not on file  ?Other Topics Concern  ? Not on file  ?Social History Narrative  ? Not on file  ? ?Social Determinants of Health  ? ?Financial Resource Strain: Not on file  ?Food Insecurity: Not on file  ?Transportation Needs: Not on file  ?Physical Activity: Not on file  ?Stress: Not on file  ?Social Connections: Not on file  ? ? ?History reviewed. No pertinent family history. ? ?Outpatient Encounter Medications as of 01/25/2022  ?Medication Sig  ? aspirin 81 MG EC tablet Take 81 mg by mouth daily.   ? carvedilol (COREG) 6.25 MG tablet Take 6.25 mg by mouth 2 (two) times daily.  ? glipiZIDE (GLUCOTROL XL) 5  MG 24 hr tablet TAKE 1 TABLET(5 MG) BY MOUTH DAILY WITH BREAKFAST  ? insulin lispro (HUMALOG) 100 UNIT/ML KwikPen Inject 10-16 Units into the skin 3 (three) times daily with meals.  ? LANTUS SOLOSTAR 100 UNIT/ML Solostar Pen Inject 60 Units into the skin at bedtime.  ? lovastatin (MEVACOR) 40 MG tablet Take 40 mg by mouth every evening.  ? rosuvastatin (CRESTOR) 20 MG tablet Take 20 mg by mouth at bedtime.  ? Vitamin D, Ergocalciferol, (DRISDOL) 1.25 MG (50000 UNIT) CAPS capsule Take 1 capsule (50,000 Units total) by mouth every 7 (seven) days.  ? ?No facility-administered encounter medications on file as of 01/25/2022.  ? ? ?ALLERGIES: ?No Known Allergies ? ?VACCINATION STATUS: ?Immunization History  ?Administered Date(s) Administered  ? Influenza, High Dose Seasonal PF 06/13/2014, 07/24/2016, 07/15/2017, 07/29/2018  ? Moderna Sars-Covid-2 Vaccination 12/08/2019, 01/05/2020  ? Pneumococcal Conjugate-13 06/13/2014  ?  Pneumococcal Polysaccharide-23 04/23/2012  ? ? ?Diabetes ?He presents for his follow-up diabetic visit. He has type 2 diabetes mellitus. Onset time: He was diagnosed at approximate age of 11 years. His disease course has been improving. Pertinent negatives for hypoglycemia include no confusion, dizziness, headaches, hunger, pallor, seizures, sweats or tremors. Associated symptoms include blurred vision, polydipsia and polyuria. Pertinent negatives for diabetes include no chest pain, no fatigue, no polyphagia and no weakness. Pertinent negatives for hypoglycemia complications include no blackouts and no required assistance. Symptoms are improving. Diabetic complications include heart disease and nephropathy. Risk factors for coronary artery disease include diabetes mellitus, dyslipidemia, male sex, hypertension, sedentary lifestyle and tobacco exposure. Current diabetic treatment includes insulin injections and oral agent (monotherapy). He is compliant with treatment none of the time. His weight is fluctuating minimally. He is following a generally unhealthy diet. When asked about meal planning, he reported none. He has not had a previous visit with a dietitian. He never participates in exercise. His home blood glucose trend is decreasing steadily. His overall blood glucose range is >200 mg/dl. (He presents today with his meter, no logs.  He is accompanied by his daughter-in-law Alisha Burgo who is offering to help him.  He was supposed to monitor blood glucose 4 times a day-however unfortunately comes with blood glucose monitoring averaging 2 times a day.  His average blood glucose is 231 for the last 7 days, slightly improving from 251-275 to his last visit.   ? ?No hypoglycemia.  Point-of-care A1c is 11.4%. ?) An ACE inhibitor/angiotensin II receptor blocker is being taken. Eye exam is current.  ?Hypertension ?This is a chronic problem. The current episode started more than 1 year ago. The problem is controlled.  Associated symptoms include blurred vision. Pertinent negatives include no chest pain, headaches, neck pain, palpitations, shortness of breath or sweats. Risk factors for coronary artery disease include diabetes mellitus, dyslipidemia, male gender, obesity, smoking/tobacco exposure, sedentary lifestyle and family history. Past treatments include angiotensin blockers. Hypertensive end-organ damage includes CAD/MI.  ?Hyperlipidemia ?This is a chronic problem. The current episode started more than 1 year ago. Exacerbating diseases include diabetes and obesity. Factors aggravating his hyperlipidemia include smoking. Pertinent negatives include no chest pain, myalgias or shortness of breath. Current antihyperlipidemic treatment includes statins. Risk factors for coronary artery disease include dyslipidemia, diabetes mellitus, hypertension, male sex, obesity and a sedentary lifestyle.  ?Review of Systems  ?Constitutional:  Negative for chills, fatigue, fever and unexpected weight change.  ?HENT:  Negative for dental problem, mouth sores and trouble swallowing.   ?Eyes:  Positive for blurred vision. Negative for  visual disturbance.  ?Respiratory:  Negative for cough, choking, chest tightness, shortness of breath and wheezing.   ?Cardiovascular:  Negative for chest pain, palpitations and leg swelling.  ?Gastrointestinal:  Negative for abdominal distention, abdominal pain, constipation, diarrhea, nausea and vomiting.  ?Endocrine: Positive for polydipsia and polyuria. Negative for polyphagia.  ?Genitourinary:  Negative for dysuria, flank pain, hematuria and urgency.  ?Musculoskeletal:  Negative for back pain, gait problem, myalgias and neck pain.  ?Skin:  Negative for pallor, rash and wound.  ?Neurological:  Negative for dizziness, tremors, seizures, syncope, weakness, numbness and headaches.  ?Psychiatric/Behavioral:  Negative for confusion and dysphoric mood.   ? ?Objective:  ?  ?BP 117/68   Pulse (!) 58   Ht '5\' 7"'$  (1.702  m)   Wt 243 lb 3.2 oz (110.3 kg)   BMI 38.09 kg/m?   ?Wt Readings from Last 3 Encounters:  ?01/25/22 243 lb 3.2 oz (110.3 kg)  ?01/07/22 244 lb 6.4 oz (110.9 kg)  ?12/20/21 242 lb (109.8 kg)  ?  ? ? ? ?CMP  ?

## 2022-02-07 ENCOUNTER — Ambulatory Visit: Payer: Medicare Other | Admitting: "Endocrinology

## 2022-02-18 ENCOUNTER — Telehealth: Payer: Self-pay | Admitting: Pulmonary Disease

## 2022-02-18 NOTE — Telephone Encounter (Signed)
Pt scheduled for 05/23 pt wife aware

## 2022-02-19 ENCOUNTER — Ambulatory Visit: Payer: Medicare Other

## 2022-02-19 DIAGNOSIS — G4733 Obstructive sleep apnea (adult) (pediatric): Secondary | ICD-10-CM

## 2022-02-28 ENCOUNTER — Other Ambulatory Visit: Payer: Self-pay

## 2022-02-28 ENCOUNTER — Telehealth: Payer: Self-pay | Admitting: Pulmonary Disease

## 2022-02-28 DIAGNOSIS — G4733 Obstructive sleep apnea (adult) (pediatric): Secondary | ICD-10-CM

## 2022-02-28 DIAGNOSIS — N183 Chronic kidney disease, stage 3 unspecified: Secondary | ICD-10-CM

## 2022-02-28 MED ORDER — INSULIN LISPRO (1 UNIT DIAL) 100 UNIT/ML (KWIKPEN): 10.0000 [IU] | PEN_INJECTOR | Freq: Three times a day (TID) | SUBCUTANEOUS | 1 refills | Status: DC

## 2022-02-28 NOTE — Telephone Encounter (Signed)
HST showed severe  OSA with AHI 35/ hr Suggest autoCPAP  5-17 cm, mask of choice OV with me in 6 wks after starting

## 2022-03-01 NOTE — Telephone Encounter (Signed)
ATC patient.  LMTCB. 

## 2022-03-12 ENCOUNTER — Encounter: Payer: Self-pay | Admitting: "Endocrinology

## 2022-03-12 ENCOUNTER — Ambulatory Visit: Payer: Medicare Other | Admitting: "Endocrinology

## 2022-03-12 VITALS — BP 122/74 | HR 52 | Ht 67.0 in | Wt 243.2 lb

## 2022-03-12 DIAGNOSIS — E782 Mixed hyperlipidemia: Secondary | ICD-10-CM

## 2022-03-12 DIAGNOSIS — I10 Essential (primary) hypertension: Secondary | ICD-10-CM | POA: Diagnosis not present

## 2022-03-12 DIAGNOSIS — N183 Chronic kidney disease, stage 3 unspecified: Secondary | ICD-10-CM

## 2022-03-12 DIAGNOSIS — Z91199 Patient's noncompliance with other medical treatment and regimen due to unspecified reason: Secondary | ICD-10-CM

## 2022-03-12 DIAGNOSIS — Z6838 Body mass index (BMI) 38.0-38.9, adult: Secondary | ICD-10-CM

## 2022-03-12 DIAGNOSIS — Z794 Long term (current) use of insulin: Secondary | ICD-10-CM

## 2022-03-12 DIAGNOSIS — E1122 Type 2 diabetes mellitus with diabetic chronic kidney disease: Secondary | ICD-10-CM

## 2022-03-12 MED ORDER — FREESTYLE LIBRE 2 SENSOR MISC: 1.0000 | 3 refills | Status: AC

## 2022-03-12 MED ORDER — FREESTYLE LIBRE 2 READER DEVI: 0 refills | Status: AC

## 2022-03-12 NOTE — Patient Instructions (Signed)

## 2022-03-12 NOTE — Progress Notes (Signed)
03/12/2022, 1:35 PM  Endocrinology follow-up note   Subjective:    Patient ID: Cody Wood, male    DOB: Jul 31, 1946.  Cody Wood is being seen in follow-up after he was seen consultation for management of currently uncontrolled symptomatic diabetes in May 2020 requested by  Heywood Bene, PA-C.  Past Medical History:  Diagnosis Date   Blockage of coronary artery of heart (HCC)    Nonobstructive CAD by cath 2012   Chronic bronchitis ([place])    Diabetes mellitus    metformin   hodgkins lymphoma dx'd 06/2008   Hypertension    Peripheral vascular disease ([place])     Past Surgical History:  Procedure Laterality Date   ABDOMINAL AORTOGRAM W/LOWER EXTREMITY N/A 05/12/2019   Procedure: ABDOMINAL AORTOGRAM W/LOWER EXTREMITY;  Surgeon: [name], MD;  Location: [facility];  Service: Cardiovascular;  Laterality: N/A;   CERVICAL SPINE SURGERY  2003   ENDARTERECTOMY FEMORAL Left 05/24/2019   Procedure: ENDARTERECTOMY FEMORAL;  Surgeon: [name], MD;  Location: [facility];  Service: Vascular;  Laterality: Left;   KNEE SURGERY     LEFT HEART CATHETERIZATION WITH CORONARY ANGIOGRAM N/A 09/03/2011   Procedure: LEFT HEART CATHETERIZATION WITH CORONARY ANGIOGRAM;  Surgeon: Laverda Page, MD;  Location: [facility]ATH [facility];  Service: Cardiovascular;  Laterality: N/A;   MANDIBLE SURGERY     NECK SURGERY     PATCH ANGIOPLASTY Left 05/24/2019   Procedure: Patch Angioplasty Superficial Femoral Artery;  Surgeon: [name], MD;  Location: [facility] OR;  Service: Vascular;  Laterality: Left;    Social History   Socioeconomic History   Marital status: Divorced    Spouse name: Not on file   Number of children: Not on file   Years of education: Not on file   Highest education level: Not on file  Occupational History   Not on file  Tobacco Use   Smoking status: Former    Packs/day: 2.00     Years: 40.00    Total pack years: 80.00    Types: Cigarettes    Quit date: 11/05/1985    Years since quitting: 36.3   Smokeless tobacco: Current    Types: Chew  Vaping Use   Vaping Use: Never used  Substance and Sexual Activity   Alcohol use: No   Drug use: No   Sexual activity: Not on file  Other Topics Concern   Not on file  Social History Narrative   Not on file   Social Determinants of Health   Financial Resource Strain: Not on file  Food Insecurity: Not on file  Transportation Needs: Not on file  Physical Activity: Not on file  Stress: Not on file  Social Connections: Not on file    History reviewed. No pertinent family history.  Outpatient Encounter Medications as of 03/12/2022  Medication Sig   Continuous Blood Gluc Receiver (FREESTYLE LIBRE 2 READER) DEVI As directed   Continuous Blood Gluc Sensor (FREESTYLE LIBRE 2 SENSOR) MISC 1 Piece by Does not apply route every 14 (fourteen) days.   aspirin 81 MG EC tablet Take 81 mg by mouth daily.    carvedilol (COREG) 6.25  MG tablet Take 6.25 mg by mouth 2 (two) times daily.   glimepiride (AMARYL) 4 MG tablet Take 4 mg by mouth 2 (two) times daily.   glipiZIDE (GLUCOTROL XL) 5 MG 24 hr tablet TAKE 1 TABLET(5 MG) BY MOUTH DAILY WITH BREAKFAST   insulin lispro (HUMALOG) 100 UNIT/ML KwikPen Inject 10-16 Units into the skin 3 (three) times daily with meals.   LANTUS SOLOSTAR 100 UNIT/ML Solostar Pen Inject 70 Units into the skin at bedtime.   lovastatin (MEVACOR) 40 MG tablet Take 40 mg by mouth every evening.   rosuvastatin (CRESTOR) 20 MG tablet Take 20 mg by mouth at bedtime.   Vitamin D, Ergocalciferol, (DRISDOL) 1.25 MG (50000 UNIT) CAPS capsule Take 1 capsule (50,000 Units total) by mouth every 7 (seven) days.   No facility-administered encounter medications on file as of 03/12/2022.    ALLERGIES: No Known Allergies  VACCINATION STATUS: Immunization History  Administered Date(s) Administered   Influenza, High  Dose Seasonal PF 06/13/2014, 07/24/2016, 07/15/2017, 07/29/2018   Moderna Sars-Covid-2 Vaccination 12/08/2019, 01/05/2020   Pneumococcal Conjugate-13 06/13/2014   Pneumococcal Polysaccharide-23 04/23/2012    Diabetes He presents for his follow-up diabetic visit. He has type 2 diabetes mellitus. Onset time: He was diagnosed at approximate age of 39 years. His disease course has been improving. Pertinent negatives for hypoglycemia include no confusion, dizziness, headaches, hunger, pallor, seizures, sweats or tremors. Associated symptoms include blurred vision, polydipsia and polyuria. Pertinent negatives for diabetes include no chest pain, no fatigue, no polyphagia and no weakness. Pertinent negatives for hypoglycemia complications include no blackouts and no required assistance. Symptoms are improving. Diabetic complications include heart disease and nephropathy. Risk factors for coronary artery disease include diabetes mellitus, dyslipidemia, male sex, hypertension, sedentary lifestyle and tobacco exposure. Current diabetic treatment includes insulin injections and oral agent (monotherapy). He is compliant with treatment none of the time. His weight is fluctuating minimally. He is following a generally unhealthy diet. When asked about meal planning, he reported none. He has not had a previous visit with a dietitian. He never participates in exercise. His home blood glucose trend is decreasing steadily. His breakfast blood glucose range is generally 140-180 mg/dl. His lunch blood glucose range is generally 180-200 mg/dl. His dinner blood glucose range is generally 180-200 mg/dl. His bedtime blood glucose range is generally 180-200 mg/dl. His overall blood glucose range is 180-200 mg/dl. (He presents today with his meter, no logs.  He is accompanied by his daughter-in-law Alon Mazor who is offering to help him.  He presents with his logs showing great effort in coordinating his meals and insulin timing.   His recent A1c was 11.4%.  No hypoglycemia documented.   ) An ACE inhibitor/angiotensin II receptor blocker is being taken. Eye exam is current.  Hypertension This is a chronic problem. The current episode started more than 1 year ago. The problem is controlled. Associated symptoms include blurred vision. Pertinent negatives include no chest pain, headaches, neck pain, palpitations, shortness of breath or sweats. Risk factors for coronary artery disease include diabetes mellitus, dyslipidemia, male gender, obesity, smoking/tobacco exposure, sedentary lifestyle and family history. Past treatments include angiotensin blockers. Hypertensive end-organ damage includes CAD/MI.  Hyperlipidemia This is a chronic problem. The current episode started more than 1 year ago. Exacerbating diseases include diabetes and obesity. Factors aggravating his hyperlipidemia include smoking. Pertinent negatives include no chest pain, myalgias or shortness of breath. Current antihyperlipidemic treatment includes statins. Risk factors for coronary artery disease include dyslipidemia, diabetes mellitus, hypertension, male  sex, obesity and a sedentary lifestyle.   Review of Systems  Constitutional:  Negative for chills, fatigue, fever and unexpected weight change.  HENT:  Negative for dental problem, mouth sores and trouble swallowing.   Eyes:  Positive for blurred vision. Negative for visual disturbance.  Respiratory:  Negative for cough, choking, chest tightness, shortness of breath and wheezing.   Cardiovascular:  Negative for chest pain, palpitations and leg swelling.  Gastrointestinal:  Negative for abdominal distention, abdominal pain, constipation, diarrhea, nausea and vomiting.  Endocrine: Positive for polydipsia and polyuria. Negative for polyphagia.  Genitourinary:  Negative for dysuria, flank pain, hematuria and urgency.  Musculoskeletal:  Negative for back pain, gait problem, myalgias and neck pain.  Skin:   Negative for pallor, rash and wound.  Neurological:  Negative for dizziness, tremors, seizures, syncope, weakness, numbness and headaches.  Psychiatric/Behavioral:  Negative for confusion and dysphoric mood.     Objective:    BP 122/74   Pulse (!) 52   Ht '5\' 7"'$  (1.702 m)   Wt 243 lb 3.2 oz (110.3 kg)   BMI 38.09 kg/m   Wt Readings from Last 3 Encounters:  03/12/22 243 lb 3.2 oz (110.3 kg)  01/25/22 243 lb 3.2 oz (110.3 kg)  01/07/22 244 lb 6.4 oz (110.9 kg)       CMP     Component Value Date/Time   NA 134 12/18/2021 1109   NA 137 11/06/2015 0950   K 5.0 12/18/2021 1109   K 4.6 11/06/2015 0950   CL 97 12/18/2021 1109   CL 101 10/27/2012 1045   CO2 21 12/18/2021 1109   CO2 28 11/06/2015 0950   GLUCOSE 238 (H) 12/18/2021 1109   GLUCOSE 101 (H) 05/25/2019 0254   GLUCOSE 309 (H) 11/06/2015 0950   GLUCOSE 174 (H) 10/27/2012 1045   BUN 33 (H) 12/18/2021 1109   BUN 12.2 11/06/2015 0950   CREATININE 1.67 (H) 12/18/2021 1109   CREATININE 1.2 11/06/2015 0950   CALCIUM 9.2 12/18/2021 1109   CALCIUM 9.9 11/06/2015 0950   PROT 5.9 (L) 12/18/2021 1109   PROT 6.8 11/06/2015 0950   ALBUMIN 4.3 12/18/2021 1109   ALBUMIN 4.1 11/06/2015 0950   AST 14 12/18/2021 1109   AST 12 11/06/2015 0950   ALT 11 12/18/2021 1109   ALT 12 11/06/2015 0950   ALKPHOS 108 12/18/2021 1109   ALKPHOS 94 11/06/2015 0950   BILITOT 0.3 12/18/2021 1109   BILITOT 0.63 11/06/2015 0950   GFRNONAA 34 12/12/2020 0000   GFRAA 54 (L) 05/25/2019 0254     Diabetic Labs (most recent): Lab Results  Component Value Date   HGBA1C 11.4 (A) 01/07/2022   HGBA1C 11.5 (A) 05/21/2021   HGBA1C 12.1 12/12/2020   MICROALBUR 22 12/27/2020   MICROALBUR 36 08/14/2020    Lipid Panel     Component Value Date/Time   CHOL 100 12/12/2020 0000   TRIG 130 12/12/2020 0000   HDL 30 (A) 12/12/2020 0000   LDLCALC 69 12/12/2020 0000     Assessment & Plan:   1. Type 2 diabetes mellitus with stage 3 chronic kidney  disease, with long-term current use of insulin (HCC)  - Pratt G Innes has currently uncontrolled symptomatic type 2 DM since 76 years of age.  He presents today with his meter, no logs.  He is accompanied by his daughter-in-law Samuel Mcpeek who is offering to help him.  He presents with his logs showing great effort in coordinating his meals and insulin timing.  His  recent A1c was 11.4%.  No hypoglycemia documented.     Recent labs reviewed. - I had a long discussion with him about the progressive nature of diabetes and the pathology behind its complications. -his diabetes is complicated by renal artery disease, CKD, random intermittent severe hypoglycemia and he remains at exceedingly high high risk for more acute and chronic complications which include CAD, CVA, CKD, retinopathy, and neuropathy. These are all discussed in detail with him.  - I have counseled him on diet management  by adopting a carbohydrate restricted/protein rich diet.  - he acknowledges that there is a room for improvement in his food and drink choices. - Suggestion is made for him to avoid simple carbohydrates  from his diet including Cakes, Sweet Desserts, Ice Cream, Soda (diet and regular), Sweet Tea, Candies, Chips, Cookies, Store Bought Juices, Alcohol , Artificial Sweeteners,  Coffee Creamer, and "Sugar-free" Products, Lemonade. This will help patient to have more stable blood glucose profile and potentially avoid unintended weight gain.  - I encouraged him to switch to  unprocessed or minimally processed complex starch and increased protein intake (animal or plant source), fruits, and vegetables.  - he is advised to stick to a routine mealtimes to eat 3 meals  a day and avoid unnecessary snacks ( to snack only to correct hypoglycemia).   - he will be scheduled with Jearld Fenton, RDN, CDE for individualized diabetes education.  - I have approached him with the following individualized plan to manage diabetes and  patient agrees:   -In light of his ongoing glycemic burden, he will need multiple daily injections of insulin in order for him to achieve control of diabetes to target.   However, without monitoring properly this will not be practical.  He will benefit from a CGM, I will prescribe the freestyle libre device for him.    -Once again, I have advised the family to continue monitoring blood glucose 4 times a day-before meals and at bedtime, increase Lantus to 70 units nightly, continue Humalog  10-16 units 3 times daily AC for Premeal blood glucose readings above 90 mg/day.   He will return in 5 weeks with his meter and logs.    - He  is advised to call clinic for hypoglycemia below 70 or persistent hyperglycemia above 200 mg per DL.  -He has responded to glipizide, advised to continue glipizide 5 mg XL p.o. daily at breakfast.   - he is warned not to take insulin without proper monitoring per orders.   - he is not a candidate for metformin  nor SGLT2 inhibitors due to concurrent renal insufficiency.  - he will be considered for incretin therapy as appropriate next visit.  - Patient specific target  A1c;  LDL, HDL, Triglycerides, and  Waist Circumference were discussed in detail.  2) Blood Pressure /Hypertension:  -His blood pressure is controlled to target.  he is advised to continue his current medications including losartan 50 mg p.o. daily with breakfast .  3) Lipids/Hyperlipidemia:   Review of his recent lipid panel showed  uncontrolled  LDL at 91 .  he  is advised to continue lovastatin 20 mg p.o. daily at bedtime.    - Side effects and precautions discussed with him.  4)  Weight/Diet:  Body mass index is 38.09 kg/m.  -   clearly complicating his diabetes care.  I discussed with him the fact that loss of 5 - 10% of his  current body weight will have the most impact  on his diabetes management.  CDE Consult will be initiated . Exercise, and detailed carbohydrates information provided  -   detailed on discharge instructions.  5) Chronic Care/Health Maintenance:  -he  is on ACEI/ARB and Statin medications and  is encouraged to initiate and continue to follow up with Ophthalmology, Dentist,  Podiatrist at least yearly or according to recommendations, and advised to  stay away from smoking. I have recommended yearly flu vaccine and pneumonia vaccine at least every 5 years; moderate intensity exercise for up to 150 minutes weekly; and  sleep for at least 7 hours a day.  - he is  advised to maintain close follow up with Heywood Bene, PA-C for primary care needs, as well as his other providers for optimal and coordinated care.   I spent 31 minutes in the care of the patient today including review of labs from [facility], Lipids, Thyroid Function, Hematology (current and previous including abstractions from other facilities); face-to-face time discussing  his blood glucose readings/logs, discussing hypoglycemia and hyperglycemia episodes and symptoms, medications doses, his options of short and long term treatment based on the latest standards of care / guidelines;  discussion about incorporating lifestyle medicine;  and documenting the encounter.    Please refer to Patient Instructions for Blood Glucose Monitoring and Insulin/Medications Dosing Guide"  in media tab for additional information. Please  also refer to " Patient Self Inventory" in the Media  tab for reviewed elements of pertinent patient history.  Kym Groom Nugent participated in the discussions, expressed understanding, and voiced agreement with the above plans.  All questions were answered to his satisfaction. he is encouraged to contact clinic should he have any questions or concerns prior to his return visit.    Follow up plan: - Return in about 5 weeks (around 04/16/2022) for Bring Meter/CGM Device/Logs- A1c in Office.  Glade Lloyd, MD [facility] [address] [place], [place] [address] Phone: [phone]  Fax: [phone]    03/12/2022, 1:35 PM  This note was partially dictated with voice recognition software. Similar sounding words can be transcribed inadequately or may not  be corrected upon review.

## 2022-03-13 NOTE — Telephone Encounter (Signed)
ATC patient. LMTCB. Will go ahead and order cpap since have not been able to reach patient after 2 attempts.

## 2022-03-13 NOTE — Telephone Encounter (Signed)
Called and spoke to patient. Changed order  because patient confirms he does not have a cpap machine in home any longer. He voiced understanding of results. And nothing further is needed  at this time.

## 2022-03-26 ENCOUNTER — Encounter: Payer: Self-pay | Admitting: Pulmonary Disease

## 2022-03-26 ENCOUNTER — Ambulatory Visit: Payer: Medicare Other | Admitting: Pulmonary Disease

## 2022-03-26 DIAGNOSIS — G4733 Obstructive sleep apnea (adult) (pediatric): Secondary | ICD-10-CM | POA: Diagnosis not present

## 2022-03-26 DIAGNOSIS — I10 Essential (primary) hypertension: Secondary | ICD-10-CM

## 2022-03-26 NOTE — Assessment & Plan Note (Signed)
Well-controlled on current medications 

## 2022-04-07 ENCOUNTER — Emergency Department (HOSPITAL_COMMUNITY)
Admission: EM | Admit: 2022-04-07 | Discharge: 2022-04-07 | Disposition: A | Discharge status: Home / Self Care | Payer: Medicare Other | Attending: Emergency Medicine | Admitting: Emergency Medicine

## 2022-04-07 ENCOUNTER — Other Ambulatory Visit: Payer: Self-pay

## 2022-04-07 ENCOUNTER — Encounter (HOSPITAL_COMMUNITY): Payer: Self-pay | Admitting: *Deleted

## 2022-04-07 ENCOUNTER — Emergency Department (HOSPITAL_COMMUNITY): Payer: Medicare Other

## 2022-04-07 ENCOUNTER — Other Ambulatory Visit: Payer: Self-pay | Admitting: "Endocrinology

## 2022-04-07 DIAGNOSIS — Z7982 Long term (current) use of aspirin: Secondary | ICD-10-CM | POA: Insufficient documentation

## 2022-04-07 DIAGNOSIS — I10 Essential (primary) hypertension: Secondary | ICD-10-CM | POA: Diagnosis not present

## 2022-04-07 DIAGNOSIS — R296 Repeated falls: Secondary | ICD-10-CM | POA: Diagnosis present

## 2022-04-07 DIAGNOSIS — N189 Chronic kidney disease, unspecified: Secondary | ICD-10-CM

## 2022-04-07 DIAGNOSIS — I2510 Atherosclerotic heart disease of native coronary artery without angina pectoris: Secondary | ICD-10-CM | POA: Diagnosis not present

## 2022-04-07 DIAGNOSIS — Z79899 Other long term (current) drug therapy: Secondary | ICD-10-CM | POA: Diagnosis not present

## 2022-04-07 DIAGNOSIS — N179 Acute kidney failure, unspecified: Secondary | ICD-10-CM | POA: Diagnosis not present

## 2022-04-07 DIAGNOSIS — R55 Syncope and collapse: Secondary | ICD-10-CM | POA: Insufficient documentation

## 2022-04-07 DIAGNOSIS — S7002XA Contusion of left hip, initial encounter: Secondary | ICD-10-CM

## 2022-04-07 DIAGNOSIS — S20212A Contusion of left front wall of thorax, initial encounter: Secondary | ICD-10-CM

## 2022-04-07 DIAGNOSIS — E119 Type 2 diabetes mellitus without complications: Secondary | ICD-10-CM | POA: Diagnosis not present

## 2022-04-07 DIAGNOSIS — I129 Hypertensive chronic kidney disease with stage 1 through stage 4 chronic kidney disease, or unspecified chronic kidney disease: Secondary | ICD-10-CM | POA: Diagnosis not present

## 2022-04-07 DIAGNOSIS — Z794 Long term (current) use of insulin: Secondary | ICD-10-CM | POA: Insufficient documentation

## 2022-04-07 DIAGNOSIS — R102 Pelvic and perineal pain: Secondary | ICD-10-CM | POA: Diagnosis not present

## 2022-04-07 DIAGNOSIS — R0781 Pleurodynia: Secondary | ICD-10-CM | POA: Diagnosis not present

## 2022-04-07 DIAGNOSIS — R0789 Other chest pain: Secondary | ICD-10-CM | POA: Insufficient documentation

## 2022-04-07 DIAGNOSIS — R252 Cramp and spasm: Secondary | ICD-10-CM

## 2022-04-07 LAB — URINALYSIS, ROUTINE W REFLEX MICROSCOPIC
Bacteria, UA: NONE SEEN
Bilirubin Urine: NEGATIVE
Glucose, UA: 50 mg/dL — AB
Ketones, ur: NEGATIVE mg/dL
Leukocytes,Ua: NEGATIVE
Nitrite: NEGATIVE
Protein, ur: NEGATIVE mg/dL
Specific Gravity, Urine: 1.011 (ref 1.005–1.030)
pH: 5 (ref 5.0–8.0)

## 2022-04-07 LAB — CBC WITH DIFFERENTIAL/PLATELET
Abs Immature Granulocytes: 0.01 10*3/uL (ref 0.00–0.07)
Basophils Absolute: 0 10*3/uL (ref 0.0–0.1)
Basophils Relative: 0 %
Eosinophils Absolute: 0.2 10*3/uL (ref 0.0–0.5)
Eosinophils Relative: 5 %
HCT: 35 % — ABNORMAL LOW (ref 39.0–52.0)
Hemoglobin: 10.9 g/dL — ABNORMAL LOW (ref 13.0–17.0)
Immature Granulocytes: 0 %
Lymphocytes Relative: 16 %
Lymphs Abs: 0.8 10*3/uL (ref 0.7–4.0)
MCH: 25.5 pg — ABNORMAL LOW (ref 26.0–34.0)
MCHC: 31.1 g/dL (ref 30.0–36.0)
MCV: 81.8 fL (ref 80.0–100.0)
Monocytes Absolute: 0.4 10*3/uL (ref 0.1–1.0)
Monocytes Relative: 8 %
Neutro Abs: 3.3 10*3/uL (ref 1.7–7.7)
Neutrophils Relative %: 71 %
Platelets: 141 10*3/uL — ABNORMAL LOW (ref 150–400)
RBC: 4.28 MIL/uL (ref 4.22–5.81)
RDW: 15.3 % (ref 11.5–15.5)
WBC: 4.6 10*3/uL (ref 4.0–10.5)
nRBC: 0 % (ref 0.0–0.2)

## 2022-04-07 LAB — COMPREHENSIVE METABOLIC PANEL
ALT: 13 U/L (ref 0–44)
AST: 14 U/L — ABNORMAL LOW (ref 15–41)
Albumin: 4 g/dL (ref 3.5–5.0)
Alkaline Phosphatase: 85 U/L (ref 38–126)
Anion gap: 9 (ref 5–15)
BUN: 30 mg/dL — ABNORMAL HIGH (ref 8–23)
CO2: 30 mmol/L (ref 22–32)
Calcium: 9.4 mg/dL (ref 8.9–10.3)
Chloride: 98 mmol/L (ref 98–111)
Creatinine, Ser: 2.05 mg/dL — ABNORMAL HIGH (ref 0.61–1.24)
GFR, Estimated: 33 mL/min — ABNORMAL LOW (ref >60)
Glucose, Bld: 250 mg/dL — ABNORMAL HIGH (ref 70–99)
Potassium: 4.2 mmol/L (ref 3.5–5.1)
Sodium: 137 mmol/L (ref 135–145)
Total Bilirubin: 0.7 mg/dL (ref 0.3–1.2)
Total Protein: 7.3 g/dL (ref 6.5–8.1)

## 2022-04-07 LAB — TROPONIN I (HIGH SENSITIVITY): Troponin I (High Sensitivity): 4 ng/L (ref <18)

## 2022-04-07 LAB — CBG MONITORING, ED: Glucose-Capillary: 239 mg/dL — ABNORMAL HIGH (ref 70–99)

## 2022-04-07 MED ORDER — ONDANSETRON HCL 4 MG/2ML IJ SOLN
4.0000 mg | Freq: Once | INTRAMUSCULAR | Status: AC
  Administered 2022-04-07: 4 mg via INTRAVENOUS
  Filled 2022-04-07: qty 2

## 2022-04-07 MED ORDER — OXYCODONE-ACETAMINOPHEN 5-325 MG PO TABS: 1.0000 | ORAL_TABLET | Freq: Three times a day (TID) | ORAL | 0 refills | Status: DC | PRN

## 2022-04-07 MED ORDER — LIDOCAINE 4 % EX PTCH: 1.0000 | MEDICATED_PATCH | Freq: Two times a day (BID) | CUTANEOUS | 0 refills | Status: DC

## 2022-04-07 MED ORDER — TIZANIDINE HCL 4 MG PO CAPS: 4.0000 mg | ORAL_CAPSULE | Freq: Three times a day (TID) | ORAL | 0 refills | Status: DC | PRN

## 2022-04-07 MED ORDER — MORPHINE SULFATE 15 MG PO TABS
15.0000 mg | ORAL_TABLET | ORAL | Status: DC | PRN
  Administered 2022-04-07: 15 mg via ORAL
  Filled 2022-04-07: qty 1

## 2022-04-07 MED ORDER — MORPHINE SULFATE (PF) 4 MG/ML IV SOLN
4.0000 mg | Freq: Once | INTRAVENOUS | Status: AC
  Administered 2022-04-07: 4 mg via INTRAVENOUS
  Filled 2022-04-07: qty 1

## 2022-04-07 MED ORDER — ONDANSETRON HCL 4 MG/2ML IJ SOLN
4.0000 mg | Freq: Once | INTRAMUSCULAR | Status: AC
Start: 2022-04-07 — End: 2022-04-07
  Administered 2022-04-07: 4 mg via INTRAVENOUS
  Filled 2022-04-07: qty 2

## 2022-04-07 MED ORDER — HYDROMORPHONE HCL 1 MG/ML IJ SOLN
1.0000 mg | Freq: Once | INTRAMUSCULAR | Status: AC
  Administered 2022-04-07: 1 mg via INTRAVENOUS
  Filled 2022-04-07: qty 1

## 2022-04-07 MED ORDER — LIDOCAINE 5 % EX PTCH
1.0000 | MEDICATED_PATCH | CUTANEOUS | Status: DC
  Administered 2022-04-07: 1 via TRANSDERMAL
  Filled 2022-04-07: qty 1

## 2022-04-07 MED ORDER — METHOCARBAMOL 500 MG PO TABS
500.0000 mg | ORAL_TABLET | Freq: Once | ORAL | Status: AC
Start: 2022-04-07 — End: 2022-04-07
  Administered 2022-04-07: 500 mg via ORAL
  Filled 2022-04-07: qty 1

## 2022-04-07 MED ORDER — LIDOCAINE 5 % EX PTCH
1.0000 | MEDICATED_PATCH | CUTANEOUS | Status: DC
Start: 2022-04-07 — End: 2022-04-08
  Administered 2022-04-07: 1 via TRANSDERMAL
  Filled 2022-04-07: qty 1

## 2022-04-07 MED ORDER — SODIUM CHLORIDE 0.9 % IV SOLN
2.0000 g | Freq: Once | INTRAVENOUS | Status: AC
  Administered 2022-04-07: 2 g via INTRAVENOUS
  Filled 2022-04-07: qty 20

## 2022-04-07 NOTE — ED Notes (Signed)
ED Provider at bedside. 

## 2022-04-07 NOTE — ED Notes (Signed)
Patient is tearful in pain at this time. Pain medication ordered by EDP.

## 2022-04-07 NOTE — ED Notes (Signed)
Patient transported to CT 

## 2022-04-07 NOTE — ED Notes (Signed)
Patient transported to X-ray 

## 2022-04-07 NOTE — ED Provider Notes (Signed)
[facility] EMERGENCY DEPARTMENT Provider Note   CSN: [id number] Arrival date & time: 04/07/22  1415     History  Chief Complaint  Patient presents with   Cody Wood is a 76 y.o. male with a history significant for type 2 diabetes, hypertension, hyperlipidemia, peripheral arterial disease, CAD presenting for evaluation of the near syncopal event which occurred 7 days ago while he was in the shower.  He states that he was trying to sit on the shower bench when he became lightheaded and felt like he was going to pass out which he did not do but he missed sitting on the bench and ended up falling hitting his left rib cage on the chair edge.  He denies having chest pain or palpitations at the time of this fall.  He has persistent pain in this left side since his fall but was tolerating it until he sat down hard in his chair today and his back struck a balled up blanket at which time his pain escalated, describing sharp, deep cramping pain in his left pelvis.    He also has a chronic wound on his left lateral lower leg which was healing, he reinjured the site when he brushed it across an object at a yard sale last week, after which it started draining purulent fluid with increased swelling and pain at the site.  He denies fevers or chills, denies shortness of breath.  He states his blood glucose levels have been not well controlled recently although he has been compliant with his insulins.     The history is provided by the patient.       Home Medications Prior to Admission medications   Medication Sig Start Date End Date Taking? Authorizing Provider  aspirin 81 MG EC tablet Take 81 mg by mouth daily.     [provider]  carvedilol (COREG) 6.25 MG tablet Take 6.25 mg by mouth 2 (two) times daily. 04/15/19   [provider]  Continuous Blood Gluc Receiver (FREESTYLE LIBRE 2 READER) DEVI As directed 03/12/22   Cassandria Anger, MD  Continuous Blood Gluc  Sensor (FREESTYLE LIBRE 2 SENSOR) MISC 1 Piece by Does not apply route every 14 (fourteen) days. 03/12/22   Cassandria Anger, MD  glimepiride (AMARYL) 4 MG tablet Take 4 mg by mouth 2 (two) times daily. 12/23/21   [provider]  glipiZIDE (GLUCOTROL XL) 5 MG 24 hr tablet TAKE 1 TABLET(5 MG) BY MOUTH DAILY WITH BREAKFAST 01/16/22   Nida, Marella Chimes, MD  insulin lispro (HUMALOG) 100 UNIT/ML KwikPen Inject 10-16 Units into the skin 3 (three) times daily with meals. 02/28/22   Cassandria Anger, MD  LANTUS SOLOSTAR 100 UNIT/ML Solostar Pen Inject 70 Units into the skin at bedtime. 10/04/14   [provider]  lovastatin (MEVACOR) 40 MG tablet Take 40 mg by mouth every evening. 05/05/19   [provider]  rosuvastatin (CRESTOR) 20 MG tablet Take 20 mg by mouth at bedtime. 12/06/19   [provider]  Vitamin D, Ergocalciferol, (DRISDOL) 1.25 MG (50000 UNIT) CAPS capsule Take 1 capsule (50,000 Units total) by mouth every 7 (seven) days. 01/07/22   Cassandria Anger, MD      Allergies    Patient has no known allergies.    Review of Systems   Review of Systems  Constitutional:  Negative for fever.  HENT:  Negative for congestion and sore throat.   Eyes: Negative.   Respiratory:  Negative for chest tightness and shortness of breath.   Cardiovascular:  Positive for chest pain.  Gastrointestinal:  Negative for abdominal pain, nausea and vomiting.  Genitourinary: Negative.   Musculoskeletal:  Negative for arthralgias, joint swelling and neck pain.  Skin:  Positive for wound. Negative for rash.  Neurological:  Positive for syncope and weakness. Negative for dizziness, light-headedness, numbness and headaches.  Psychiatric/Behavioral: Negative.      Physical Exam Updated Vital Signs BP 136/84   Pulse (!) 50   Temp 98.2 F (36.8 C) (Oral)   Resp 16   Ht '5\' 7"'$  (1.702 m)   Wt 111.9 kg   SpO2 100%   BMI 38.62 kg/m  Physical Exam Vitals and nursing note  reviewed.  Constitutional:      Appearance: He is well-developed. He is ill-appearing.  HENT:     Head: Normocephalic and atraumatic.  Eyes:     Conjunctiva/sclera: Conjunctivae normal.  Cardiovascular:     Rate and Rhythm: Normal rate and regular rhythm.     Heart sounds: Normal heart sounds.  Pulmonary:     Effort: Pulmonary effort is normal.     Breath sounds: Normal breath sounds. No wheezing.  Chest:     Chest wall: Tenderness present. No deformity or swelling.     Comments: Tender to palpation left lateral mid to lower rib cage mid axillary line.  No palpable deformity, no flail, no crepitus. Abdominal:     General: Bowel sounds are normal.     Palpations: Abdomen is soft.     Tenderness: There is no abdominal tenderness.  Musculoskeletal:        General: Normal range of motion.     Cervical back: Normal range of motion.       Back:     Comments: Tender to palpation across left posterior pelvic rim.  There is no bruising or hematoma noted.  He has no midline lumbar pain.  Skin:    General: Skin is warm and dry.  Neurological:     Mental Status: He is alert.     ED Results / Procedures / Treatments   Labs (all labs ordered are listed, but only abnormal results are displayed) Labs Reviewed  CBC WITH DIFFERENTIAL/PLATELET - Abnormal; Notable for the following components:      Result Value   Hemoglobin 10.9 (*)    HCT 35.0 (*)    MCH 25.5 (*)    Platelets 141 (*)    All other components within normal limits  COMPREHENSIVE METABOLIC PANEL - Abnormal; Notable for the following components:   Glucose, Bld 250 (*)    BUN 30 (*)    Creatinine, Ser 2.05 (*)    AST 14 (*)    GFR, Estimated 33 (*)    All other components within normal limits  CBG MONITORING, ED - Abnormal; Notable for the following components:   Glucose-Capillary 239 (*)    All other components within normal limits  URINALYSIS, ROUTINE W REFLEX MICROSCOPIC  TROPONIN I (HIGH SENSITIVITY)    EKG EKG  Interpretation  Date/Time:  Sunday April 07 2022 14:35:22 EDT Ventricular Rate:  53 PR Interval:  193 QRS Duration: 95 QT Interval:  427 QTC Calculation: 401 R Axis:   -2 Text Interpretation: Sinus rhythm Low voltage, precordial leads Minimal ST depression, inferior leads No acute changes No significant change since last tracing Confirmed by Varney Biles [phone]) on 04/07/2022 7:12:00 PM  Radiology DG Hips Bilat W or Wo Pelvis 3-4 Views  Result Date: 04/07/2022 CLINICAL DATA:  Pain after fall EXAM: DG HIP (WITH OR WITHOUT PELVIS) 3-4V BILAT COMPARISON:  CT 10/17/2019 FINDINGS: SI joints are non widened. Pubic symphysis and rami appear intact. No fracture or malalignment. Mild degenerative changes of both hips. Vascular calcifications. IMPRESSION: Degenerative changes.  No acute osseous abnormality. Electronically Signed   By: Donavan Foil M.D.   On: 04/07/2022 19:23   DG Ribs Unilateral W/Chest Left  Result Date: 04/07/2022 CLINICAL DATA:  Fall with chest wall pain. EXAM: LEFT RIBS AND CHEST - 3+ VIEW COMPARISON:  Chest x-ray 12/21/2013 FINDINGS: There is no acute fracture or dislocation identified. Specifically, no acute left-sided rib fractures are seen. There is a healed left fourth rib fracture. Lung volumes are low. There is atelectasis in the left lung base. Cardiomediastinal silhouette within normal limits. No pleural effusion or pneumothorax. Cervical spinal fusion plate is partially visualized. IMPRESSION: 1. No acute left-sided rib fractures. 2. Low lung volumes with left lower lobe atelectasis. Electronically Signed   By: Ronney Asters M.D.   On: 04/07/2022 16:02    Procedures Procedures    Medications Ordered in ED Medications  cefTRIAXone (ROCEPHIN) 2 g in sodium chloride 0.9 % 100 mL IVPB (2 g Intravenous New Bag/Given 04/07/22 1922)  ondansetron (ZOFRAN) injection 4 mg (4 mg Intravenous Given 04/07/22 1546)  morphine (PF) 4 MG/ML injection 4 mg (4 mg Intravenous Given 04/07/22  1546)  HYDROmorphone (DILAUDID) injection 1 mg (1 mg Intravenous Given 04/07/22 1812)    ED Course/ Medical Decision Making/ A&P Clinical Course as of 04/07/22 1940  Sun Apr 07, 2022  1924 Pt initially refused to roll on side to evaluate back secondary to pain.  No pain improvement with morphine.  Added dilaudid 1 mg after which he was able to roll.  He has no bruising, no midline cervical, thoracic or lumbar pain but is exquisitely tender across left posterior pelvic rim, no bruising, edema or deformity noted. Will send back for pelvis/hip films.  [JI]    Clinical Course User Index [JI] Landis Martins                           Medical Decision Making Pt with left sided rib cage and pelvis pain of unclear etiology, associated with fall one week ago, agravated this am when he fell hard into his chair.  Plain films negative.  No improvement with morphine, dilaudid some improved but still complaint of deep spasm like pain.  CT imaging has been ordered.    Pt also with left lower extremity wound infection, rocephin given.  Will need abx if dc to home.   Discussed with Dr. Kathrynn Humble who assumes care of pt.   Amount and/or Complexity of Data Reviewed Labs: ordered. Radiology: ordered.  Risk Prescription drug management.           Final Clinical Impression(s) / ED Diagnoses Final diagnoses:  None    Rx / DC Orders ED Discharge Orders     None         Landis Martins 04/07/22 1940    Varney Biles, MD 04/07/22 2308

## 2022-04-07 NOTE — ED Notes (Signed)
Pt ambulated in room without assistance at this time. Pt had no problems ambulation. Cody Wood

## 2022-04-07 NOTE — ED Triage Notes (Signed)
Pt brought in byr c/o fall x 4-5 days ago; pt fell on left side and is now c/o left rib pain     Pt had wound on left lower leg that reopened during the fall  Cbg  [phone]

## 2022-04-07 NOTE — Discharge Instructions (Addendum)
Take the medications prescribed for symptom management.  You will need to follow-up with your primary care doctor for repeat evaluation of your kidney function.  Recommend that you apply cold and warm compresses to the area of discomfort.  Also, we recommend as much activity as you can tolerate without severe pain.  Given that you are being sent with strong pain medication and muscle relaxant, be very careful with ambulation in order to prevent further falls.  If feeling drowsy or unsteady, make sure you do not try to walk.

## 2022-04-16 ENCOUNTER — Encounter: Payer: Self-pay | Admitting: "Endocrinology

## 2022-04-16 ENCOUNTER — Ambulatory Visit (INDEPENDENT_AMBULATORY_CARE_PROVIDER_SITE_OTHER): Payer: Medicare Other | Admitting: "Endocrinology

## 2022-04-16 VITALS — BP 100/52 | HR 76 | Ht 67.0 in | Wt 244.6 lb

## 2022-04-16 DIAGNOSIS — E782 Mixed hyperlipidemia: Secondary | ICD-10-CM | POA: Diagnosis not present

## 2022-04-16 DIAGNOSIS — E1122 Type 2 diabetes mellitus with diabetic chronic kidney disease: Secondary | ICD-10-CM | POA: Diagnosis not present

## 2022-04-16 DIAGNOSIS — Z794 Long term (current) use of insulin: Secondary | ICD-10-CM

## 2022-04-16 DIAGNOSIS — E559 Vitamin D deficiency, unspecified: Secondary | ICD-10-CM

## 2022-04-16 DIAGNOSIS — Z91199 Patient's noncompliance with other medical treatment and regimen due to unspecified reason: Secondary | ICD-10-CM | POA: Diagnosis not present

## 2022-04-16 DIAGNOSIS — I10 Essential (primary) hypertension: Secondary | ICD-10-CM

## 2022-04-16 DIAGNOSIS — N183 Chronic kidney disease, stage 3 unspecified: Secondary | ICD-10-CM | POA: Diagnosis not present

## 2022-04-16 NOTE — Progress Notes (Signed)
04/16/2022, 6:46 PM  Endocrinology follow-up note   Subjective:    Patient ID: Cody Wood, male    DOB: 04/12/1946.  Cody Wood is being seen in follow-up after he was seen consultation for management of currently uncontrolled symptomatic diabetes in May 2020 requested by  Heywood Bene, PA-C.  Past Medical History:  Diagnosis Date   Blockage of coronary artery of heart (HCC)    Nonobstructive CAD by cath 2012   Chronic bronchitis ([place])    Diabetes mellitus    metformin   hodgkins lymphoma dx'd 06/2008   Hypertension    Peripheral vascular disease ([place])     Past Surgical History:  Procedure Laterality Date   ABDOMINAL AORTOGRAM W/LOWER EXTREMITY N/A 05/12/2019   Procedure: ABDOMINAL AORTOGRAM W/LOWER EXTREMITY;  Surgeon: [name], MD;  Location: [place];  Service: Cardiovascular;  Laterality: N/A;   CERVICAL SPINE SURGERY  2003   ENDARTERECTOMY FEMORAL Left 05/24/2019   Procedure: ENDARTERECTOMY FEMORAL;  Surgeon: [name], MD;  Location: [place];  Service: Vascular;  Laterality: Left;   KNEE SURGERY     LEFT HEART CATHETERIZATION WITH CORONARY ANGIOGRAM N/A 09/03/2011   Procedure: LEFT HEART CATHETERIZATION WITH CORONARY ANGIOGRAM;  Surgeon: Laverda Page, MD;  Location: [facility] CATH [facility];  Service: Cardiovascular;  Laterality: N/A;   MANDIBLE SURGERY     NECK SURGERY     PATCH ANGIOPLASTY Left 05/24/2019   Procedure: Patch Angioplasty Superficial Femoral Artery;  Surgeon: [name], MD;  Location: [facility];  Service: Vascular;  Laterality: Left;    Social History   Socioeconomic History   Marital status: Divorced    Spouse name: Not on file   Number of children: Not on file   Years of education: Not on file   Highest education level: Not on file  Occupational History   Not on file  Tobacco Use   Smoking status: Former    Packs/day: 2.00     Years: 40.00    Total pack years: 80.00    Types: Cigarettes    Quit date: 11/05/1985    Years since quitting: 36.4   Smokeless tobacco: Current    Types: Chew  Vaping Use   Vaping Use: Never used  Substance and Sexual Activity   Alcohol use: No   Drug use: No   Sexual activity: Not on file  Other Topics Concern   Not on file  Social History Narrative   Not on file   Social Determinants of Health   Financial Resource Strain: Not on file  Food Insecurity: Not on file  Transportation Needs: Not on file  Physical Activity: Not on file  Stress: Not on file  Social Connections: Not on file    History reviewed. No pertinent family history.  Outpatient Encounter Medications as of 04/16/2022  Medication Sig   aspirin 81 MG EC tablet Take 81 mg by mouth daily.    carvedilol (COREG) 6.25 MG tablet Take 6.25 mg by mouth 2 (two) times daily.   Continuous Blood Gluc Receiver (FREESTYLE LIBRE 2 READER) DEVI As directed   Continuous Blood Gluc Sensor (FREESTYLE LIBRE 2 SENSOR) MISC  1 Piece by Does not apply route every 14 (fourteen) days.   insulin lispro (HUMALOG) 100 UNIT/ML KwikPen Inject 10-16 Units into the skin 3 (three) times daily with meals.   LANTUS SOLOSTAR 100 UNIT/ML Solostar Pen Inject 70 Units into the skin at bedtime.   lovastatin (MEVACOR) 40 MG tablet Take 40 mg by mouth every evening.   rosuvastatin (CRESTOR) 20 MG tablet Take 20 mg by mouth at bedtime.   tiZANidine (ZANAFLEX) 4 MG capsule Take 1 capsule (4 mg total) by mouth 3 (three) times daily as needed for muscle spasms.   Vitamin D, Ergocalciferol, (DRISDOL) 1.25 MG (50000 UNIT) CAPS capsule TAKE 1 CAPSULE BY MOUTH EVERY 7 DAYS   [DISCONTINUED] glimepiride (AMARYL) 4 MG tablet Take 4 mg by mouth 2 (two) times daily.   [DISCONTINUED] glipiZIDE (GLUCOTROL XL) 5 MG 24 hr tablet TAKE 1 TABLET(5 MG) BY MOUTH DAILY WITH BREAKFAST   [DISCONTINUED] lidocaine 4 % Place 1 patch onto the skin 2 (two) times daily.    [DISCONTINUED] oxyCODONE-acetaminophen (PERCOCET/ROXICET) 5-325 MG tablet Take 1 tablet by mouth every 8 (eight) hours as needed for severe pain.   No facility-administered encounter medications on file as of 04/16/2022.    ALLERGIES: No Known Allergies  VACCINATION STATUS: Immunization History  Administered Date(s) Administered   Influenza, High Dose Seasonal PF 06/13/2014, 07/24/2016, 07/15/2017, 07/29/2018   Moderna Sars-Covid-2 Vaccination 12/08/2019, 01/05/2020   Pneumococcal Conjugate-13 06/13/2014   Pneumococcal Polysaccharide-23 04/23/2012    Diabetes He presents for his follow-up diabetic visit. He has type 2 diabetes mellitus. Onset time: He was diagnosed at approximate age of 59 years. His disease course has been improving. Pertinent negatives for hypoglycemia include no confusion, dizziness, headaches, hunger, pallor, seizures, sweats or tremors. Associated symptoms include blurred vision. Pertinent negatives for diabetes include no chest pain, no fatigue, no polydipsia, no polyphagia, no polyuria and no weakness. Pertinent negatives for hypoglycemia complications include no blackouts and no required assistance. Symptoms are improving. Diabetic complications include heart disease and nephropathy. Risk factors for coronary artery disease include diabetes mellitus, dyslipidemia, male sex, hypertension, sedentary lifestyle and tobacco exposure. Current diabetic treatment includes insulin injections and oral agent (monotherapy). He is compliant with treatment none of the time. His weight is fluctuating minimally. He is following a generally unhealthy diet. When asked about meal planning, he reported none. He has not had a previous visit with a dietitian. He never participates in exercise. His home blood glucose trend is decreasing steadily. His breakfast blood glucose range is generally 140-180 mg/dl. His lunch blood glucose range is generally 140-180 mg/dl. His dinner blood glucose range is  generally 140-180 mg/dl. His bedtime blood glucose range is generally 140-180 mg/dl. His overall blood glucose range is 140-180 mg/dl. (He presents with continued improvement in his glycemic profile.  His Libre to AGP analysis shows 158 average blood glucose for the last 14 days.  He has 71% time range, 24% level 1 hyperglycemia, 4% level 2 hyperglycemia.  No significant sustained hypoglycemia.  ) An ACE inhibitor/angiotensin II receptor blocker is being taken. Eye exam is current.  Hypertension This is a chronic problem. The current episode started more than 1 year ago. The problem is controlled. Associated symptoms include blurred vision. Pertinent negatives include no chest pain, headaches, neck pain, palpitations, shortness of breath or sweats. Risk factors for coronary artery disease include diabetes mellitus, dyslipidemia, male gender, obesity, smoking/tobacco exposure, sedentary lifestyle and family history. Past treatments include angiotensin blockers. Hypertensive end-organ damage includes CAD/MI.  Hyperlipidemia This is a chronic problem. The current episode started more than 1 year ago. Exacerbating diseases include diabetes and obesity. Factors aggravating his hyperlipidemia include smoking. Pertinent negatives include no chest pain, myalgias or shortness of breath. Current antihyperlipidemic treatment includes statins. Risk factors for coronary artery disease include dyslipidemia, diabetes mellitus, hypertension, male sex, obesity and a sedentary lifestyle.   Review of Systems  Constitutional:  Negative for chills, fatigue, fever and unexpected weight change.  HENT:  Negative for dental problem, mouth sores and trouble swallowing.   Eyes:  Positive for blurred vision. Negative for visual disturbance.  Respiratory:  Negative for cough, choking, chest tightness, shortness of breath and wheezing.   Cardiovascular:  Negative for chest pain, palpitations and leg swelling.  Gastrointestinal:   Negative for abdominal distention, abdominal pain, constipation, diarrhea, nausea and vomiting.  Endocrine: Negative for polydipsia, polyphagia and polyuria.  Genitourinary:  Negative for dysuria, flank pain, hematuria and urgency.  Musculoskeletal:  Negative for back pain, gait problem, myalgias and neck pain.  Skin:  Negative for pallor, rash and wound.  Neurological:  Negative for dizziness, tremors, seizures, syncope, weakness, numbness and headaches.  Psychiatric/Behavioral:  Negative for confusion and dysphoric mood.     Objective:    BP (!) 100/52   Pulse 76   Ht '5\' 7"'$  (1.702 m)   Wt 244 lb 9.6 oz (110.9 kg)   BMI 38.31 kg/m   Wt Readings from Last 3 Encounters:  04/16/22 244 lb 9.6 oz (110.9 kg)  04/07/22 246 lb 9.6 oz (111.9 kg)  03/26/22 246 lb 9.6 oz (111.9 kg)       CMP     Component Value Date/Time   NA 137 04/07/2022 1428   NA 134 12/18/2021 1109   NA 137 11/06/2015 0950   K 4.2 04/07/2022 1428   K 4.6 11/06/2015 0950   CL 98 04/07/2022 1428   CL 101 10/27/2012 1045   CO2 30 04/07/2022 1428   CO2 28 11/06/2015 0950   GLUCOSE 250 (H) 04/07/2022 1428   GLUCOSE 309 (H) 11/06/2015 0950   GLUCOSE 174 (H) 10/27/2012 1045   BUN 30 (H) 04/07/2022 1428   BUN 33 (H) 12/18/2021 1109   BUN 12.2 11/06/2015 0950   CREATININE 2.05 (H) 04/07/2022 1428   CREATININE 1.2 11/06/2015 0950   CALCIUM 9.4 04/07/2022 1428   CALCIUM 9.9 11/06/2015 0950   PROT 7.3 04/07/2022 1428   PROT 5.9 (L) 12/18/2021 1109   PROT 6.8 11/06/2015 0950   ALBUMIN 4.0 04/07/2022 1428   ALBUMIN 4.3 12/18/2021 1109   ALBUMIN 4.1 11/06/2015 0950   AST 14 (L) 04/07/2022 1428   AST 12 11/06/2015 0950   ALT 13 04/07/2022 1428   ALT 12 11/06/2015 0950   ALKPHOS 85 04/07/2022 1428   ALKPHOS 94 11/06/2015 0950   BILITOT 0.7 04/07/2022 1428   BILITOT 0.3 12/18/2021 1109   BILITOT 0.63 11/06/2015 0950   GFRNONAA 33 (L) 04/07/2022 1428   GFRAA 54 (L) 05/25/2019 0254     Diabetic Labs (most  recent): Lab Results  Component Value Date   HGBA1C 11.4 (A) 01/07/2022   HGBA1C 11.5 (A) 05/21/2021   HGBA1C 12.1 12/12/2020   MICROALBUR 22 12/27/2020   MICROALBUR 36 08/14/2020    Lipid Panel     Component Value Date/Time   CHOL 100 12/12/2020 0000   TRIG 130 12/12/2020 0000   HDL 30 (A) 12/12/2020 0000   LDLCALC 69 12/12/2020 0000     Assessment & Plan:  1. Type 2 diabetes mellitus with stage 3 chronic kidney disease, with long-term current use of insulin (HCC)  - Kodie G Barb has currently uncontrolled symptomatic type 2 DM since 76 years of age.  He presents with continued improvement in his glycemic profile.  His Libre to AGP analysis shows 158 average blood glucose for the last 14 days.  He has 71% time range, 24% level 1 hyperglycemia, 4% level 2 hyperglycemia.  No significant sustained hypoglycemia.   His A1c on March 22, 2022 was 8.7% improving from 11.4%.   Recent labs reviewed. - I had a long discussion with him about the progressive nature of diabetes and the pathology behind its complications. -his diabetes is complicated by renal artery disease, CKD, random intermittent severe hypoglycemia and he remains at exceedingly high high risk for more acute and chronic complications which include CAD, CVA, CKD, retinopathy, and neuropathy. These are all discussed in detail with him.  - I have counseled him on diet management  by adopting a carbohydrate restricted/protein rich diet.  - he acknowledges that there is a room for improvement in his food and drink choices. - Suggestion is made for him to avoid simple carbohydrates  from his diet including Cakes, Sweet Desserts, Ice Cream, Soda (diet and regular), Sweet Tea, Candies, Chips, Cookies, Store Bought Juices, Alcohol , Artificial Sweeteners,  Coffee Creamer, and "Sugar-free" Products, Lemonade. This will help patient to have more stable blood glucose profile and potentially avoid unintended weight gain.  - I  encouraged him to switch to  unprocessed or minimally processed complex starch and increased protein intake (animal or plant source), fruits, and vegetables.  - he is advised to stick to a routine mealtimes to eat 3 meals  a day and avoid unnecessary snacks ( to snack only to correct hypoglycemia).   - he will be scheduled with Jearld Fenton, RDN, CDE for individualized diabetes education.  - I have approached him with the following individualized plan to manage diabetes and patient agrees:   -In light of his ongoing glycemic burden, he will continue to need intensive treatment with basal/bolus insulin in order for him to maintain control of diabetes to target.  He is benefiting from his CGM.  Advised to continue to wear at all times.   -Once again, I have advised the family to continue monitoring blood glucose 4 times a day-before meals and at bedtime, continue Lantus 70 units nightly, continue Humalog 10  - 16 units 3 times daily AC for Premeal blood glucose readings above 90 mg/day.     - He  is advised to call clinic for hypoglycemia below 70 or persistent hyperglycemia above 200 mg per DL.  -He is advised to discontinue glipizide at this time.  - he is warned not to take insulin without proper monitoring per orders.   - he is not a candidate for metformin  nor SGLT2 inhibitors due to concurrent renal insufficiency.  - he will be considered for incretin therapy as appropriate next visit.  - Patient specific target  A1c;  LDL, HDL, Triglycerides, and  Waist Circumference were discussed in detail.  2) Blood Pressure /Hypertension:  -His blood pressure is controlled to target.  he is advised to continue his current medications including losartan 50 mg p.o. daily with breakfast .  3) Lipids/Hyperlipidemia:   Review of his recent lipid panel showed significantly improved LDL at 53 improving from 91.  He is advised to continue lovastatin 20 mg p.o. daily at bedtime.   -  Side effects and  precautions discussed with him.  4)  Weight/Diet:  Body mass index is 38.31 kg/m.  -   clearly complicating his diabetes care.  I discussed with him the fact that loss of 5 - 10% of his  current body weight will have the most impact on his diabetes management.  CDE Consult will be initiated . Exercise, and detailed carbohydrates information provided  -  detailed on discharge instructions.  5) Chronic Care/Health Maintenance:  -he  is on ACEI/ARB and Statin medications and  is encouraged to initiate and continue to follow up with Ophthalmology, Dentist,  Podiatrist at least yearly or according to recommendations, and advised to  stay away from smoking. I have recommended yearly flu vaccine and pneumonia vaccine at least every 5 years; moderate intensity exercise for up to 150 minutes weekly; and  sleep for at least 7 hours a day.  He has superficial, granulating, recent infection healing  on left tibia area, advised to continue to get care at wound center.    He has just completed a course of antibiotics treatment.    - he is  advised to maintain close follow up with Heywood Bene, PA-C for primary care needs, as well as his other providers for optimal and coordinated care.   I spent 41 minutes in the care of the patient today including review of labs from [place], Lipids, Thyroid Function, Hematology (current and previous including abstractions from other facilities); face-to-face time discussing  his blood glucose readings/logs, discussing hypoglycemia and hyperglycemia episodes and symptoms, medications doses, his options of short and long term treatment based on the latest standards of care / guidelines;  discussion about incorporating lifestyle medicine;  and documenting the encounter. Risk reduction counseling performed per USPSTF guidelines to reduce obesity and cardiovascular risk factors.     Please refer to Patient Instructions for Blood Glucose Monitoring and Insulin/Medications Dosing  Guide"  in media tab for additional information. Please  also refer to " Patient Self Inventory" in the Media  tab for reviewed elements of pertinent patient history.  Kym Groom Harnois participated in the discussions, expressed understanding, and voiced agreement with the above plans.  All questions were answered to his satisfaction. he is encouraged to contact clinic should he have any questions or concerns prior to his return visit.    Follow up plan: - Return in about 3 months (around 07/17/2022) for Bring Meter/CGM Device/Logs- A1c in Office.  Glade Lloyd, MD [facility] [address] [place], [place] [address] Phone: [phone]  Fax: [phone]    04/16/2022, 6:46 PM  This note was partially dictated with voice recognition software. Similar sounding words can be transcribed inadequately or may not  be corrected upon review.

## 2022-04-16 NOTE — Patient Instructions (Signed)

## 2022-05-24 ENCOUNTER — Ambulatory Visit: Payer: Medicare Other | Admitting: Pulmonary Disease

## 2022-05-28 ENCOUNTER — Ambulatory Visit (INDEPENDENT_AMBULATORY_CARE_PROVIDER_SITE_OTHER): Payer: Medicare Other | Admitting: Podiatry

## 2022-05-28 DIAGNOSIS — B351 Tinea unguium: Secondary | ICD-10-CM | POA: Diagnosis not present

## 2022-05-28 DIAGNOSIS — M79674 Pain in right toe(s): Secondary | ICD-10-CM | POA: Diagnosis not present

## 2022-05-28 DIAGNOSIS — M2041 Other hammer toe(s) (acquired), right foot: Secondary | ICD-10-CM

## 2022-05-28 DIAGNOSIS — E1151 Type 2 diabetes mellitus with diabetic peripheral angiopathy without gangrene: Secondary | ICD-10-CM | POA: Diagnosis not present

## 2022-05-28 DIAGNOSIS — M79675 Pain in left toe(s): Secondary | ICD-10-CM | POA: Diagnosis not present

## 2022-05-28 DIAGNOSIS — M2042 Other hammer toe(s) (acquired), left foot: Secondary | ICD-10-CM

## 2022-05-28 DIAGNOSIS — R609 Edema, unspecified: Secondary | ICD-10-CM | POA: Diagnosis not present

## 2022-05-28 NOTE — Progress Notes (Signed)
Subjective:   Patient ID: Cody Wood, male   DOB: 76 y.o.   MRN: [id number]   HPI Chief Complaint  Patient presents with   Diabetes    Patient states toenails are long, thick, difficult to tri and painful. Patient also states he is interested in diabetic shoes   76 year old male presents with the above concerns.  He is scheduled for a vascular procedure in [place]. He has been goig to the wound care center for a large ulcer on the left lateral leg, doing much beter.   A1c 8.7 on 03/22/2022. States it runs in mid 150's.    Review of Systems  All other systems reviewed and are negative.  Past Medical History:  Diagnosis Date   Blockage of coronary artery of heart (HCC)    Nonobstructive CAD by cath 2012   Chronic bronchitis ([place])    Diabetes mellitus    metformin   hodgkins lymphoma dx'd 06/2008   Hypertension    Peripheral vascular disease ([place])     Past Surgical History:  Procedure Laterality Date   ABDOMINAL AORTOGRAM W/LOWER EXTREMITY N/A 05/12/2019   Procedure: ABDOMINAL AORTOGRAM W/LOWER EXTREMITY;  Surgeon: [name], MD;  Location: [place] [facility];  Service: Cardiovascular;  Laterality: N/A;   CERVICAL SPINE SURGERY  2003   ENDARTERECTOMY FEMORAL Left 05/24/2019   Procedure: ENDARTERECTOMY FEMORAL;  Surgeon: [name], MD;  Location: [place];  Service: Vascular;  Laterality: Left;   KNEE SURGERY     LEFT HEART CATHETERIZATION WITH CORONARY ANGIOGRAM N/A 09/03/2011   Procedure: LEFT HEART CATHETERIZATION WITH CORONARY ANGIOGRAM;  Surgeon: Laverda Page, MD;  Location: [facility] CATH LAB;  Service: Cardiovascular;  Laterality: N/A;   MANDIBLE SURGERY     NECK SURGERY     PATCH ANGIOPLASTY Left 05/24/2019   Procedure: Patch Angioplasty Superficial Femoral Artery;  Surgeon: [name], MD;  Location: [facility] OR;  Service: Vascular;  Laterality: Left;     Current Outpatient Medications:    aspirin 81 MG EC tablet, Take 81 mg by mouth daily. ,  Disp: , Rfl:    carvedilol (COREG) 6.25 MG tablet, Take 6.25 mg by mouth 2 (two) times daily., Disp: , Rfl:    Continuous Blood Gluc Receiver (FREESTYLE LIBRE 2 READER) DEVI, As directed, Disp: 1 each, Rfl: 0   Continuous Blood Gluc Sensor (FREESTYLE LIBRE 2 SENSOR) MISC, 1 Piece by Does not apply route every 14 (fourteen) days., Disp: 2 each, Rfl: 3   insulin lispro (HUMALOG) 100 UNIT/ML KwikPen, Inject 10-16 Units into the skin 3 (three) times daily with meals., Disp: 15 mL, Rfl: 1   LANTUS SOLOSTAR 100 UNIT/ML Solostar Pen, Inject 70 Units into the skin at bedtime., Disp: , Rfl: 0   lovastatin (MEVACOR) 40 MG tablet, Take 40 mg by mouth every evening., Disp: , Rfl:    rosuvastatin (CRESTOR) 20 MG tablet, Take 20 mg by mouth at bedtime., Disp: , Rfl:    tiZANidine (ZANAFLEX) 4 MG capsule, Take 1 capsule (4 mg total) by mouth 3 (three) times daily as needed for muscle spasms., Disp: 15 capsule, Rfl: 0   Vitamin D, Ergocalciferol, (DRISDOL) 1.25 MG (50000 UNIT) CAPS capsule, TAKE 1 CAPSULE BY MOUTH EVERY 7 DAYS, Disp: 12 capsule, Rfl: 0  Allergies  Allergen Reactions   Lisinopril Cough          Objective:  Physical Exam  General: AAO x3, NAD  Dermatological: Nails are hypertrophic, dystrophic, brittle, discolored, elongated 10. No  surrounding redness or drainage. Tenderness nails 1-5 bilaterally. No open lesions or pre-ulcerative lesions are identified today.  Vascular: Dorsalis Pedis artery and Posterior Tibial artery pedal pulses are decreased bilateral.  There is no pain with calf compression, swelling, warmth, erythema.   Neruologic: Grossly intact via light touch bilateral.  Musculoskeletal: Mild hammertoes bilateral 2nd toe        Assessment:   76 year old male with symptomatic onychomycosis, requesting diabetic shoes     Plan:  -Treatment options discussed including all alternatives, risks, and complications -Etiology of symptoms were discussed -Nails debrided 10  without complications or bleeding. -Start the authorization process for diabetic shoes.  I will have him follow-up for measurement. -Daily foot inspection -Follow-up in 3 months or sooner if any problems arise. In the meantime, encouraged to call the office with any questions, concerns, change in symptoms.   Trula Slade DPM   Celesta Gentile, DPM

## 2022-06-10 ENCOUNTER — Telehealth: Payer: Self-pay | Admitting: Student

## 2022-06-10 NOTE — Telephone Encounter (Signed)
Left message on voicemail for patient to call back for appointment

## 2022-06-10 NOTE — Telephone Encounter (Signed)
-----   Message from Stark Klein sent at 06/06/2022  9:35 PM EDT ----- Marykay Lex can one of yall call and see if this pt can come in next week for measurement  ----- Message ----- From: Trula Slade, DPM Sent: 05/31/2022   8:56 AM EDT To: Stark Klein  I am not sure right now how the front is scheduling for diabetic shoes or keeping a list but I wanted this patient to be measured for diabetic shoes whenever available.  I didn't see anything scheduled so I just wanted to follow up to make sure he did not get lost. There is an order in the computer. Thanks.

## 2022-07-01 ENCOUNTER — Ambulatory Visit: Payer: Medicare Other | Admitting: Pulmonary Disease

## 2022-07-05 ENCOUNTER — Other Ambulatory Visit: Payer: Self-pay | Admitting: "Endocrinology

## 2022-07-08 ENCOUNTER — Ambulatory Visit: Payer: Medicare Other | Admitting: Podiatry

## 2022-07-08 DIAGNOSIS — M2041 Other hammer toe(s) (acquired), right foot: Secondary | ICD-10-CM

## 2022-07-08 DIAGNOSIS — E1151 Type 2 diabetes mellitus with diabetic peripheral angiopathy without gangrene: Secondary | ICD-10-CM

## 2022-07-08 DIAGNOSIS — M2042 Other hammer toe(s) (acquired), left foot: Secondary | ICD-10-CM

## 2022-07-15 NOTE — Progress Notes (Signed)
Patient presents today to get measured for diabetic shoes.  He has no new concerns today.  He was measured for shoes by Don Perking.  He had no new pedal complaints.

## 2022-07-18 ENCOUNTER — Encounter: Payer: Self-pay | Admitting: "Endocrinology

## 2022-07-18 ENCOUNTER — Ambulatory Visit: Payer: Medicare Other | Admitting: "Endocrinology

## 2022-07-18 VITALS — BP 130/54 | HR 72 | Ht 67.0 in | Wt 244.4 lb

## 2022-07-18 DIAGNOSIS — E1122 Type 2 diabetes mellitus with diabetic chronic kidney disease: Secondary | ICD-10-CM | POA: Diagnosis not present

## 2022-07-18 DIAGNOSIS — E782 Mixed hyperlipidemia: Secondary | ICD-10-CM | POA: Diagnosis not present

## 2022-07-18 DIAGNOSIS — Z794 Long term (current) use of insulin: Secondary | ICD-10-CM | POA: Diagnosis not present

## 2022-07-18 DIAGNOSIS — Z6838 Body mass index (BMI) 38.0-38.9, adult: Secondary | ICD-10-CM

## 2022-07-18 DIAGNOSIS — N183 Chronic kidney disease, stage 3 unspecified: Secondary | ICD-10-CM | POA: Diagnosis not present

## 2022-07-18 DIAGNOSIS — I10 Essential (primary) hypertension: Secondary | ICD-10-CM | POA: Diagnosis not present

## 2022-07-18 LAB — POCT GLYCOSYLATED HEMOGLOBIN (HGB A1C): HbA1c, POC (controlled diabetic range): 8.4 % — AB (ref 0.0–7.0)

## 2022-07-18 NOTE — Patient Instructions (Signed)
                                     Advice for Weight Management  -For most of us the best way to lose weight is by diet management. Generally speaking, diet management means consuming less calories intentionally which over time brings about progressive weight loss.  This can be achieved more effectively by avoiding ultra processed carbohydrates, processed meats, unhealthy fats.    It is critically important to know your numbers: how much calorie you are consuming and how much calorie you need. More importantly, our carbohydrates sources should be unprocessed naturally occurring  complex starch food items.  It is always important to balance nutrition also by  appropriate intake of proteins (mainly plant-based), healthy fats/oils, plenty of fruits and vegetables.   -The American College of Lifestyle Medicine (ACL M) recommends nutrition derived mostly from Whole Food, Plant Predominant Sources example an apple instead of applesauce or apple pie. Eat Plenty of vegetables, Mushrooms, fruits, Legumes, Whole Grains, Nuts, seeds in lieu of processed meats, processed snacks/pastries red meat, poultry, eggs.  Use only water or unsweetened tea for hydration.  The College also recommends the need to stay away from risky substances including alcohol, smoking; obtaining 7-9 hours of restorative sleep, at least 150 minutes of moderate intensity exercise weekly, importance of healthy social connections, and being mindful of stress and seek help when it is overwhelming.    -Sticking to a routine mealtime to eat 3 meals a day and avoiding unnecessary snacks is shown to have a big role in weight control. Under normal circumstances, the only time we burn stored energy is when we are hungry, so allow  some hunger to take place- hunger means no food between appropriate meal times, only water.  It is not advisable to starve.   -It is better to avoid simple carbohydrates including:  Cakes, Sweet Desserts, Ice Cream, Soda (diet and regular), Sweet Tea, Candies, Chips, Cookies, Store Bought Juices, Alcohol in Excess of  1-2 drinks a day, Lemonade,  Artificial Sweeteners, Doughnuts, Coffee Creamers, "Sugar-free" Products, etc, etc.  This is not a complete list.....    -Consulting with certified diabetes educators is proven to provide you with the most accurate and current information on diet.  Also, you may be  interested in discussing diet options/exchanges , we can schedule a visit with Cody Wood, RDN, CDE for individualized nutrition education.  -Exercise: If you are able: 30 -60 minutes a day ,4 days a week, or 150 minutes of moderate intensity exercise weekly.    The longer the better if tolerated.  Combine stretch, strength, and aerobic activities.  If you were told in the past that you have high risk for cardiovascular diseases, or if you are currently symptomatic, you may seek evaluation by your heart doctor prior to initiating moderate to intense exercise programs.                                  Additional Care Considerations for Diabetes/Prediabetes   -Diabetes  is a chronic disease.  The most important care consideration is regular follow-up with your diabetes care provider with the goal being avoiding or delaying its complications and to take advantage of advances in medications and technology.  If appropriate actions are taken early enough, type 2 diabetes can even be   reversed.  Seek information from the right source.  - Whole Food, Plant Predominant Nutrition is highly recommended: Eat Plenty of vegetables, Mushrooms, fruits, Legumes, Whole Grains, Nuts, seeds in lieu of processed meats, processed snacks/pastries red meat, poultry, eggs as recommended by American College of  Lifestyle Medicine (ACLM).  -Type 2 diabetes is known to coexist with other important comorbidities such as high blood pressure and high cholesterol.  It is critical to control not only the  diabetes but also the high blood pressure and high cholesterol to minimize and delay the risk of complications including coronary artery disease, stroke, amputations, blindness, etc.  The good news is that this diet recommendation for type 2 diabetes is also very helpful for managing high cholesterol and high blood blood pressure.  - Studies showed that people with diabetes will benefit from a class of medications known as ACE inhibitors and statins.  Unless there are specific reasons not to be on these medications, the standard of care is to consider getting one from these groups of medications at an optimal doses.  These medications are generally considered safe and proven to help protect the heart and the kidneys.    - People with diabetes are encouraged to initiate and maintain regular follow-up with eye doctors, foot doctors, dentists , and if necessary heart and kidney doctors.     - It is highly recommended that people with diabetes quit smoking or stay away from smoking, and get yearly  flu vaccine and pneumonia vaccine at least every 5 years.  See above for additional recommendations on exercise, sleep, stress management , and healthy social connections.      

## 2022-07-18 NOTE — Progress Notes (Signed)
07/18/2022, 4:00 PM  Endocrinology follow-up note   Subjective:    Patient ID: Cody Wood, male    DOB: 06-25-1946.  Cody Wood is being seen in follow-up after he was seen consultation for management of currently uncontrolled symptomatic diabetes in May 2020 requested by  Heywood Bene, PA-C.  Past Medical History:  Diagnosis Date   Blockage of coronary artery of heart (HCC)    Nonobstructive CAD by cath 2012   Chronic bronchitis ([place])    Diabetes mellitus    metformin   hodgkins lymphoma dx'd 06/2008   Hypertension    Peripheral vascular disease ([place])     Past Surgical History:  Procedure Laterality Date   ABDOMINAL AORTOGRAM W/LOWER EXTREMITY N/A 05/12/2019   Procedure: ABDOMINAL AORTOGRAM W/LOWER EXTREMITY;  Surgeon: [name], MD;  Location: [facility];  Service: Cardiovascular;  Laterality: N/A;   CERVICAL SPINE SURGERY  2003   ENDARTERECTOMY FEMORAL Left 05/24/2019   Procedure: ENDARTERECTOMY FEMORAL;  Surgeon: [name], MD;  Location: [facility];  Service: Vascular;  Laterality: Left;   KNEE SURGERY     LEFT HEART CATHETERIZATION WITH CORONARY ANGIOGRAM N/A 09/03/2011   Procedure: LEFT HEART CATHETERIZATION WITH CORONARY ANGIOGRAM;  Surgeon: Laverda Page, MD;  Location: [facility] CATH LAB;  Service: Cardiovascular;  Laterality: N/A;   MANDIBLE SURGERY     NECK SURGERY     PATCH ANGIOPLASTY Left 05/24/2019   Procedure: Patch Angioplasty Superficial Femoral Artery;  Surgeon: [name], MD;  Location: [facility] OR;  Service: Vascular;  Laterality: Left;    Social History   Socioeconomic History   Marital status: Divorced    Spouse name: Not on file   Number of children: Not on file   Years of education: Not on file   Highest education level: Not on file  Occupational History   Not on file  Tobacco Use   Smoking status: Former    Packs/day:  2.00    Years: 40.00    Total pack years: 80.00    Types: Cigarettes    Quit date: 11/05/1985    Years since quitting: 36.7   Smokeless tobacco: Current    Types: Chew  Vaping Use   Vaping Use: Never used  Substance and Sexual Activity   Alcohol use: No   Drug use: No   Sexual activity: Not on file  Other Topics Concern   Not on file  Social History Narrative   Not on file   Social Determinants of Health   Financial Resource Strain: Not on file  Food Insecurity: Not on file  Transportation Needs: Not on file  Physical Activity: Not on file  Stress: Not on file  Social Connections: Not on file    History reviewed. No pertinent family history.  Outpatient Encounter Medications as of 07/18/2022  Medication Sig   aspirin 81 MG EC tablet Take 81 mg by mouth daily.    carvedilol (COREG) 6.25 MG tablet Take 6.25 mg by mouth 2 (two) times daily.   Continuous Blood Gluc Receiver (FREESTYLE LIBRE 2 READER) DEVI As directed   Continuous Blood Gluc Sensor (FREESTYLE LIBRE 2 SENSOR) MISC  1 Piece by Does not apply route every 14 (fourteen) days.   insulin lispro (HUMALOG) 100 UNIT/ML KwikPen Inject 10-16 Units into the skin 3 (three) times daily with meals.   LANTUS SOLOSTAR 100 UNIT/ML Solostar Pen Inject 60 Units into the skin at bedtime.   rosuvastatin (CRESTOR) 20 MG tablet Take 20 mg by mouth at bedtime.   tiZANidine (ZANAFLEX) 4 MG capsule Take 1 capsule (4 mg total) by mouth 3 (three) times daily as needed for muscle spasms.   Vitamin D, Ergocalciferol, (DRISDOL) 1.25 MG (50000 UNIT) CAPS capsule TAKE 1 CAPSULE BY MOUTH EVERY 7 DAYS   [DISCONTINUED] lovastatin (MEVACOR) 40 MG tablet Take 40 mg by mouth every evening.   No facility-administered encounter medications on file as of 07/18/2022.    ALLERGIES: Allergies  Allergen Reactions   Lisinopril Cough    VACCINATION STATUS: Immunization History  Administered Date(s) Administered   Influenza, High Dose Seasonal PF  06/13/2014, 07/24/2016, 07/15/2017, 07/29/2018   Moderna Sars-Covid-2 Vaccination 12/08/2019, 01/05/2020   Pneumococcal Conjugate-13 06/13/2014   Pneumococcal Polysaccharide-23 04/23/2012    Diabetes He presents for his follow-up diabetic visit. He has type 2 diabetes mellitus. Onset time: He was diagnosed at approximate age of 76 years. His disease course has been improving. Pertinent negatives for hypoglycemia include no confusion, dizziness, headaches, hunger, pallor, seizures, sweats or tremors. Associated symptoms include blurred vision. Pertinent negatives for diabetes include no chest pain, no fatigue, no polydipsia, no polyphagia, no polyuria and no weakness. Pertinent negatives for hypoglycemia complications include no blackouts and no required assistance. Symptoms are improving. Diabetic complications include heart disease and nephropathy. Risk factors for coronary artery disease include diabetes mellitus, dyslipidemia, male sex, hypertension, sedentary lifestyle and tobacco exposure. Current diabetic treatment includes insulin injections and oral agent (monotherapy). He is compliant with treatment none of the time. His weight is stable. He is following a generally unhealthy diet. When asked about meal planning, he reported none. He has not had a previous visit with a dietitian. He never participates in exercise. His home blood glucose trend is decreasing steadily. His breakfast blood glucose range is generally 140-180 mg/dl. His lunch blood glucose range is generally 140-180 mg/dl. His dinner blood glucose range is generally 140-180 mg/dl. His bedtime blood glucose range is generally 140-180 mg/dl. His overall blood glucose range is 140-180 mg/dl. (He presents with out his CGM device.  His point-of-care A1c is 8.4%, significantly improving from 11.4%.  He has lowered his Lantus to 60 units due to fasting hypoglycemia.  No recent significant  hypoglycemia.  ) An ACE inhibitor/angiotensin II  receptor blocker is being taken. Eye exam is current.  Hypertension This is a chronic problem. The current episode started more than 1 year ago. The problem is controlled. Associated symptoms include blurred vision. Pertinent negatives include no chest pain, headaches, neck pain, palpitations, shortness of breath or sweats. Risk factors for coronary artery disease include diabetes mellitus, dyslipidemia, male gender, obesity, smoking/tobacco exposure, sedentary lifestyle and family history. Past treatments include angiotensin blockers. Hypertensive end-organ damage includes CAD/MI.  Hyperlipidemia This is a chronic problem. The current episode started more than 1 year ago. Exacerbating diseases include diabetes and obesity. Factors aggravating his hyperlipidemia include smoking. Pertinent negatives include no chest pain, myalgias or shortness of breath. Current antihyperlipidemic treatment includes statins. Risk factors for coronary artery disease include dyslipidemia, diabetes mellitus, hypertension, male sex, obesity and a sedentary lifestyle.   Review of Systems  Constitutional:  Negative for chills, fatigue, fever and unexpected  weight change.  HENT:  Negative for dental problem, mouth sores and trouble swallowing.   Eyes:  Positive for blurred vision. Negative for visual disturbance.  Respiratory:  Negative for cough, choking, chest tightness, shortness of breath and wheezing.   Cardiovascular:  Negative for chest pain, palpitations and leg swelling.  Gastrointestinal:  Negative for abdominal distention, abdominal pain, constipation, diarrhea, nausea and vomiting.  Endocrine: Negative for polydipsia, polyphagia and polyuria.  Genitourinary:  Negative for dysuria, flank pain, hematuria and urgency.  Musculoskeletal:  Negative for back pain, gait problem, myalgias and neck pain.  Skin:  Negative for pallor, rash and wound.  Neurological:  Negative for dizziness, tremors, seizures, syncope,  weakness, numbness and headaches.  Psychiatric/Behavioral:  Negative for confusion and dysphoric mood.     Objective:    BP (!) 130/54   Pulse 72   Ht '5\' 7"'$  (1.702 m)   Wt 244 lb 6.4 oz (110.9 kg)   BMI 38.28 kg/m   Wt Readings from Last 3 Encounters:  07/18/22 244 lb 6.4 oz (110.9 kg)  04/16/22 244 lb 9.6 oz (110.9 kg)  04/07/22 246 lb 9.6 oz (111.9 kg)       CMP     Component Value Date/Time   NA 137 04/07/2022 1428   NA 134 12/18/2021 1109   NA 137 11/06/2015 0950   K 4.2 04/07/2022 1428   K 4.6 11/06/2015 0950   CL 98 04/07/2022 1428   CL 101 10/27/2012 1045   CO2 30 04/07/2022 1428   CO2 28 11/06/2015 0950   GLUCOSE 250 (H) 04/07/2022 1428   GLUCOSE 309 (H) 11/06/2015 0950   GLUCOSE 174 (H) 10/27/2012 1045   BUN 30 (H) 04/07/2022 1428   BUN 33 (H) 12/18/2021 1109   BUN 12.2 11/06/2015 0950   CREATININE 2.05 (H) 04/07/2022 1428   CREATININE 1.2 11/06/2015 0950   CALCIUM 9.4 04/07/2022 1428   CALCIUM 9.9 11/06/2015 0950   PROT 7.3 04/07/2022 1428   PROT 5.9 (L) 12/18/2021 1109   PROT 6.8 11/06/2015 0950   ALBUMIN 4.0 04/07/2022 1428   ALBUMIN 4.3 12/18/2021 1109   ALBUMIN 4.1 11/06/2015 0950   AST 14 (L) 04/07/2022 1428   AST 12 11/06/2015 0950   ALT 13 04/07/2022 1428   ALT 12 11/06/2015 0950   ALKPHOS 85 04/07/2022 1428   ALKPHOS 94 11/06/2015 0950   BILITOT 0.7 04/07/2022 1428   BILITOT 0.3 12/18/2021 1109   BILITOT 0.63 11/06/2015 0950   GFRNONAA 33 (L) 04/07/2022 1428   GFRAA 54 (L) 05/25/2019 0254     Diabetic Labs (most recent): Lab Results  Component Value Date   HGBA1C 8.4 (A) 07/18/2022   HGBA1C 11.4 (A) 01/07/2022   HGBA1C 11.5 (A) 05/21/2021   MICROALBUR 22 12/27/2020   MICROALBUR 36 08/14/2020    Lipid Panel     Component Value Date/Time   CHOL 100 12/12/2020 0000   TRIG 130 12/12/2020 0000   HDL 30 (A) 12/12/2020 0000   LDLCALC 69 12/12/2020 0000     Assessment & Plan:   1. Type 2 diabetes mellitus with stage 3  chronic kidney disease, with long-term current use of insulin (HCC)  - Cody Wood has currently uncontrolled symptomatic type 2 DM since 76 years of age.  He presents with out his CGM device.  His point-of-care A1c is 8.4%, significantly improving from 11.4%.  He has lowered his Lantus to 60 units due to fasting hypoglycemia.  No recent significant  hypoglycemia.  Recent labs reviewed. - I had a long discussion with him about the progressive nature of diabetes and the pathology behind its complications. -his diabetes is complicated by renal artery disease, CKD, random intermittent severe hypoglycemia and he remains at exceedingly high high risk for more acute and chronic complications which include CAD, CVA, CKD, retinopathy, and neuropathy. These are all discussed in detail with him.  - I have counseled him on diet management  by adopting a carbohydrate restricted/protein rich diet.   - he acknowledges that there is a room for improvement in his food and drink choices. - Suggestion is made for him to avoid simple carbohydrates  from his diet including Cakes, Sweet Desserts, Ice Cream, Soda (diet and regular), Sweet Tea, Candies, Chips, Cookies, Store Bought Juices, Alcohol , Artificial Sweeteners,  Coffee Creamer, and "Sugar-free" Products, Lemonade. This will help patient to have more stable blood glucose profile and potentially avoid unintended weight gain. - I encouraged him to switch to  unprocessed or minimally processed complex starch and increased protein intake (animal or plant source), fruits, and vegetables.  - he is advised to stick to a routine mealtimes to eat 3 meals  a day and avoid unnecessary snacks ( to snack only to correct hypoglycemia).   - he will be scheduled with Jearld Fenton, RDN, CDE for individualized diabetes education.  - I have approached him with the following individualized plan to manage diabetes and patient agrees:   -In light of his ongoing  glycemic burden, he will continue to need treatment with basal/bolus insulin in order for him to achieve and maintain control of diabetes to target.      He is benefiting from his CGM.  Advised to continue to wear at all times, bring his device with him.  He is not sharing data with [place], encouraged to do so.   -Once again, I have advised the family to continue Lantus 60 units nightly, continue NovoLog 10  - 16 units 3 times daily AC for Premeal blood glucose readings above 90 mg/day.     - He  is advised to call clinic for hypoglycemia below 70 or persistent hyperglycemia above 200 mg per DL.  -He is advised to discontinue glipizide at this time.  - he is warned not to take insulin without proper monitoring per orders.   - he is not a candidate for metformin  nor SGLT2 inhibitors due to concurrent renal insufficiency.  - he will be considered for incretin therapy as appropriate next visit.  - Patient specific target  A1c;  LDL, HDL, Triglycerides, and  Waist Circumference were discussed in detail.  2) Blood Pressure /Hypertension:  -His blood pressure is controlled to target.  he is advised to continue his current medications including losartan 50 mg p.o. daily with breakfast .  3) Lipids/Hyperlipidemia:   Review of his recent lipid panel showed significantly improved LDL at 69 improving from 91.   He is advised to continue lovastatin 20 mg p.o. daily at bedtime.   - Side effects and precautions discussed with him.  4)  Weight/Diet:  Body mass index is 38.28 kg/m.  -   clearly complicating his diabetes care.  I discussed with him the fact that loss of 5 - 10% of his  current body weight will have the most impact on his diabetes management.  CDE Consult will be initiated . Exercise, and detailed carbohydrates information provided  -  detailed on discharge instructions.  5) Chronic Care/Health Maintenance:  -he  is on ACEI/ARB and Statin medications and  is encouraged to initiate and  continue to follow up with Ophthalmology, Dentist,  Podiatrist at least yearly or according to recommendations, and advised to  stay away from smoking. I have recommended yearly flu vaccine and pneumonia vaccine at least every 5 years; moderate intensity exercise for up to 150 minutes weekly; and  sleep for at least 7 hours a day.  He has superficial, granulating, recent infection healing  on left tibia area, advised to continue to get care at wound center.    He has just completed a course of antibiotics treatment.    - he is  advised to maintain close follow up with Heywood Bene, PA-C for primary care needs, as well as his other providers for optimal and coordinated care.   I spent 41 minutes in the care of the patient today including review of labs from [place], Lipids, Thyroid Function, Hematology (current and previous including abstractions from other facilities); face-to-face time discussing  his blood glucose readings/logs, discussing hypoglycemia and hyperglycemia episodes and symptoms, medications doses, his options of short and long term treatment based on the latest standards of care / guidelines;  discussion about incorporating lifestyle medicine;  and documenting the encounter. Risk reduction counseling performed per USPSTF guidelines to reduce  obesity and cardiovascular risk factors.     Please refer to Patient Instructions for Blood Glucose Monitoring and Insulin/Medications Dosing Guide"  in media tab for additional information. Please  also refer to " Patient Self Inventory" in the Media  tab for reviewed elements of pertinent patient history.  Kym Groom Mchale participated in the discussions, expressed understanding, and voiced agreement with the above plans.  All questions were answered to his satisfaction. he is encouraged to contact clinic should he have any questions or concerns prior to his return visit.     Follow up plan: - Return in about 3 months (around 10/18/2022)  for F/U with Pre-visit Labs, Meter/CGM/Logs, A1c here.  Glade Lloyd, MD [facility] [address] [place], [place] [address] Phone: [phone]  Fax: [phone]    07/18/2022, 4:00 PM  This note was partially dictated with voice recognition software. Similar sounding words can be transcribed inadequately or may not  be corrected upon review.

## 2022-08-09 ENCOUNTER — Other Ambulatory Visit: Payer: Self-pay | Admitting: "Endocrinology

## 2022-08-09 DIAGNOSIS — Z794 Long term (current) use of insulin: Secondary | ICD-10-CM

## 2022-08-21 ENCOUNTER — Ambulatory Visit: Payer: Medicare Other | Admitting: Vascular Surgery

## 2022-08-21 ENCOUNTER — Encounter: Payer: Self-pay | Admitting: Vascular Surgery

## 2022-08-21 VITALS — BP 117/60 | HR 59 | Temp 98.2°F | Ht 67.0 in | Wt 245.8 lb

## 2022-08-21 DIAGNOSIS — I739 Peripheral vascular disease, unspecified: Secondary | ICD-10-CM

## 2022-08-21 NOTE — Progress Notes (Signed)
V[facility]ist of [place]  Patient name: Cody Wood MRN: [id number] DOB: 1946/02/12 Sex: male  REASON FOR CONSULT: Evaluation known peripheral vascular occlusive disease and recent left leg wound  HPI: Cody Wood is a 76 y.o. male, who is today for evaluation.  He is here today with his daughter in law.  Does a great deal of his care and provides history.  He recently had increased swelling in both lower extremities.  He developed a large superficial wound over the lateral aspect of his left calf over the anterior compartment.  His daughter-in-law has pictures when the wound was quite large and weeping serous fluid.  He apparently had stopped taking his Lasix.  He is now completely healed his wounds although the skin does appear to be friable.  This appears to been most consistent with venous stasis disease.  He does have known history of arterial insufficiency.  He underwent left common femoral endarterectomy and bovine patch angioplasty with Dr. Carlis Abbott on 05/24/2019 for left leg ischemia.  He does not have any claudication symptoms.  He has had increasingly severe health issues that have kept him from being very active.  Past Medical History:  Diagnosis Date   Blockage of coronary artery of heart ([place])    Nonobstructive CAD by cath 2012   Chronic bronchitis ([place])    Diabetes mellitus    metformin   hodgkins lymphoma dx'd 06/2008   Hypertension    Peripheral vascular disease ([place])     History reviewed. No pertinent family history.  SOCIAL HISTORY: Social History   Socioeconomic History   Marital status: Divorced    Spouse name: Not on file   Number of children: Not on file   Years of education: Not on file   Highest education level: Not on file  Occupational History   Not on file  Tobacco Use   Smoking status: Former    Packs/day: 2.00    Years: 40.00    Total pack years: 80.00    Types: Cigarettes    Quit date:  11/05/1985    Years since quitting: 36.8   Smokeless tobacco: Current    Types: Chew  Vaping Use   Vaping Use: Never used  Substance and Sexual Activity   Alcohol use: No   Drug use: No   Sexual activity: Not on file  Other Topics Concern   Not on file  Social History Narrative   Not on file   Social Determinants of Health   Financial Resource Strain: Not on file  Food Insecurity: Not on file  Transportation Needs: Not on file  Physical Activity: Not on file  Stress: Not on file  Social Connections: Not on file  Intimate Partner Violence: Not on file    Allergies  Allergen Reactions   Lisinopril Cough    Current Outpatient Medications  Medication Sig Dispense Refill   aspirin 81 MG EC tablet Take 81 mg by mouth daily.      carvedilol (COREG) 6.25 MG tablet Take 6.25 mg by mouth 2 (two) times daily.     Continuous Blood Gluc Receiver (FREESTYLE LIBRE 2 READER) DEVI As directed 1 each 0   Continuous Blood Gluc Sensor (FREESTYLE LIBRE 2 SENSOR) MISC 1 Piece by Does not apply route every 14 (fourteen) days. 2 each 3   insulin lispro (HUMALOG KWIKPEN) 100 UNIT/ML KwikPen INJECT 10 TO 16 UNITS INTO SKIN THREE TIMES DAILY WITH MEALS 45 mL 0   LANTUS SOLOSTAR 100  UNIT/ML Solostar Pen Inject 60 Units into the skin at bedtime.  0   rosuvastatin (CRESTOR) 20 MG tablet Take 20 mg by mouth at bedtime.     tiZANidine (ZANAFLEX) 4 MG capsule Take 1 capsule (4 mg total) by mouth 3 (three) times daily as needed for muscle spasms. 15 capsule 0   Vitamin D, Ergocalciferol, (DRISDOL) 1.25 MG (50000 UNIT) CAPS capsule TAKE 1 CAPSULE BY MOUTH EVERY 7 DAYS 12 capsule 0   No current facility-administered medications for this visit.    REVIEW OF SYSTEMS:  '[X]'$  denotes positive finding, '[ ]'$  denotes negative finding Cardiac  Comments:  Chest pain or chest pressure:    Shortness of breath upon exertion: X   Short of breath when lying flat: X   Irregular heart rhythm:        Vascular    Pain  in calf, thigh, or hip brought on by ambulation: x   Pain in feet at night that wakes you up from your sleep:  x   Blood clot in your veins:    Leg swelling:  x       Pulmonary    Oxygen at home:    Productive cough:     Wheezing:         Neurologic    Sudden weakness in arms or legs:  x   Sudden numbness in arms or legs:  x   Sudden onset of difficulty speaking or slurred speech:    Temporary loss of vision in one eye:     Problems with dizziness:         Gastrointestinal    Blood in stool:     Vomited blood:         Genitourinary    Burning when urinating:     Blood in urine:        Psychiatric    Major depression:         Hematologic    Bleeding problems:    Problems with blood clotting too easily:        Skin    Rashes or ulcers: x       Constitutional    Fever or chills:      PHYSICAL EXAM: Vitals:   08/21/22 1333  BP: 117/60  Pulse: (!) 59  Temp: 98.2 F (36.8 C)  SpO2: 94%  Weight: 245 lb 12.8 oz (111.5 kg)  Height: '5\' 7"'$  (1.702 m)    GENERAL: The patient is a well-nourished male, in no acute distress. The vital signs are documented above. CARDIOVASCULAR: I do not palpate popliteal or distal pulses.  He does have pitting edema bilaterally PULMONARY: There is good air exchange  MUSCULOSKELETAL: There are no major deformities or cyanosis. NEUROLOGIC: No focal weakness or paresthesias are detected. SKIN: There are no ulcers or rashes noted.  No open ulcers but friable healed skin over the lateral aspect of his left calf PSYCHIATRIC: The patient has a normal affect.  DATA:  He underwent noninvasive [facility] at [facility] on 05/15/2022.  This showed bilateral calcified vessels therefore ankle arm indices was unreliable.  He did have monophasic flow bilaterally.  MEDICAL ISSUES: I do not feel that he has any critical ischemia to his lower extremities.  I believe his main issue is related to fluid and venous stasis disease.  His daughter-in-law's been  quite diligent and having him wear graduated compression garments but stopped this when he had the open wound.  I explained the importance of compression  to I explained the importance of compression to maintain his fluid and also diuresis and elevation when possible.  I also explained the use of dressing and Ace if he should have recurrent open wounds.  Will see [place] again on an as-needed basis   Rosetta Posner, MD [facility] [phone] Pager [phone]  Note: Portions of this report may have been transcribed using voice recognition software.  Every effort has been made to ensure accuracy; however, inadvertent computerized transcription errors may still be present.

## 2022-08-28 ENCOUNTER — Ambulatory Visit: Payer: Medicare Other | Admitting: Podiatry

## 2022-08-28 ENCOUNTER — Encounter: Payer: Self-pay | Admitting: Podiatry

## 2022-08-28 DIAGNOSIS — E1151 Type 2 diabetes mellitus with diabetic peripheral angiopathy without gangrene: Secondary | ICD-10-CM | POA: Diagnosis not present

## 2022-08-28 DIAGNOSIS — I739 Peripheral vascular disease, unspecified: Secondary | ICD-10-CM

## 2022-08-28 DIAGNOSIS — B351 Tinea unguium: Secondary | ICD-10-CM | POA: Diagnosis not present

## 2022-08-28 DIAGNOSIS — R609 Edema, unspecified: Secondary | ICD-10-CM

## 2022-08-28 NOTE — Progress Notes (Signed)
This patient returns to my office for at risk foot care.  This patient requires this care by a professional since this patient will be at risk due to having PAD,  CKD and DM.  This patient is unable to cut nails himself since the patient cannot reach his nails.These nails are painful walking and wearing shoes.  This patient presents for at risk foot care today.  General Appearance  Alert, conversant and in no acute stress.  Vascular  Dorsalis pedis and posterior tibial  pulses are  not palpable due to swelling.  bilaterally.  Capillary return is within normal limits  bilaterally. Temperature is within normal limits  bilaterally.  Neurologic  Senn-Weinstein monofilament wire test within normal limits  bilaterally. Muscle power within normal limits bilaterally.  Nails Thick disfigured discolored nails with subungual debris  from hallux to fifth toes bilaterally. No evidence of bacterial infection or drainage bilaterally.  Orthopedic  No limitations of motion  feet .  No crepitus or effusions noted.  No bony pathology or digital deformities noted.  Skin  normotropic skin with no porokeratosis noted bilaterally.  No signs of infections or ulcers noted.     Onychomycosis  Pain in right toes  Pain in left toes  Consent was obtained for treatment procedures.   Mechanical debridement of nails 1-5  bilaterally performed with a nail nipper.  Filed with dremel without incident.    Return office visit  3 onths                    Told patient to return for periodic foot care and evaluation due to potential at risk complications.   Gardiner Barefoot DPM

## 2022-09-14 LAB — BASIC METABOLIC PANEL WITH GFR
CO2: 25 — AB (ref 13–22)
Chloride: 99 (ref 99–108)
Creatinine: 2 — AB (ref 0.6–1.3)
Glucose: 192
Potassium: 4.7 meq/L (ref 3.5–5.1)
Sodium: 141 (ref 137–147)

## 2022-09-14 LAB — HEPATIC FUNCTION PANEL
ALT: 14 U/L (ref 10–40)
AST: 12 — AB (ref 14–40)
Alkaline Phosphatase: 111 (ref 25–125)
Bilirubin, Total: 0.5

## 2022-09-14 LAB — LIPID PANEL
Cholesterol: 105 (ref 0–200)
HDL: 30 — AB (ref 35–70)
LDL Cholesterol: 59
Triglycerides: 76 (ref 40–160)

## 2022-09-14 LAB — COMPREHENSIVE METABOLIC PANEL WITH GFR
Albumin: 4.2 (ref 3.5–5.0)
Calcium: 9.9 (ref 8.7–10.7)
Globulin: 2.1

## 2022-09-14 LAB — TSH: TSH: 1.22 (ref 0.41–5.90)

## 2022-09-14 LAB — HEMOGLOBIN A1C: Hemoglobin A1C: 11.4

## 2022-09-27 ENCOUNTER — Other Ambulatory Visit: Payer: Self-pay | Admitting: "Endocrinology

## 2022-10-13 ENCOUNTER — Other Ambulatory Visit: Payer: Self-pay | Admitting: "Endocrinology

## 2022-10-13 DIAGNOSIS — Z794 Long term (current) use of insulin: Secondary | ICD-10-CM

## 2022-10-15 ENCOUNTER — Other Ambulatory Visit: Payer: Self-pay

## 2022-10-15 DIAGNOSIS — C8190 Hodgkin lymphoma, unspecified, unspecified site: Secondary | ICD-10-CM

## 2022-10-15 MED ORDER — LANTUS SOLOSTAR 100 UNIT/ML ~~LOC~~ SOPN: 60.0000 [IU] | PEN_INJECTOR | Freq: Every day | SUBCUTANEOUS | 0 refills | Status: DC

## 2022-10-22 ENCOUNTER — Ambulatory Visit: Payer: Medicare Other | Admitting: "Endocrinology

## 2022-10-22 ENCOUNTER — Encounter: Payer: Self-pay | Admitting: "Endocrinology

## 2022-10-22 VITALS — BP 132/64 | HR 76 | Ht 67.0 in | Wt 247.0 lb

## 2022-10-22 DIAGNOSIS — E782 Mixed hyperlipidemia: Secondary | ICD-10-CM | POA: Diagnosis not present

## 2022-10-22 DIAGNOSIS — Z794 Long term (current) use of insulin: Secondary | ICD-10-CM | POA: Diagnosis not present

## 2022-10-22 DIAGNOSIS — I10 Essential (primary) hypertension: Secondary | ICD-10-CM

## 2022-10-22 DIAGNOSIS — E1122 Type 2 diabetes mellitus with diabetic chronic kidney disease: Secondary | ICD-10-CM | POA: Diagnosis not present

## 2022-10-22 DIAGNOSIS — N183 Chronic kidney disease, stage 3 unspecified: Secondary | ICD-10-CM | POA: Diagnosis not present

## 2022-10-22 DIAGNOSIS — Z6838 Body mass index (BMI) 38.0-38.9, adult: Secondary | ICD-10-CM

## 2022-10-22 LAB — POCT GLYCOSYLATED HEMOGLOBIN (HGB A1C): HbA1c, POC (controlled diabetic range): 8.7 % — AB (ref 0.0–7.0)

## 2022-10-22 NOTE — Patient Instructions (Signed)

## 2022-10-22 NOTE — Progress Notes (Signed)
10/22/2022, 2:08 PM  Endocrinology follow-up note   Subjective:    Patient ID: Cody Wood, male    DOB: 12-Sep-1946.  Cody Wood is being seen in follow-up after he was seen consultation for management of currently uncontrolled symptomatic diabetes in May 2020 requested by  Heywood Bene, PA-C.  Past Medical History:  Diagnosis Date   Blockage of coronary artery of heart (HCC)    Nonobstructive CAD by cath 2012   Chronic bronchitis ([place])    Diabetes mellitus    metformin   hodgkins lymphoma dx'd 06/2008   Hypertension    Peripheral vascular disease ([place])     Past Surgical History:  Procedure Laterality Date   ABDOMINAL AORTOGRAM W/LOWER EXTREMITY N/A 05/12/2019   Procedure: ABDOMINAL AORTOGRAM W/LOWER EXTREMITY;  Surgeon: [name], MD;  Location: [facility];  Service: Cardiovascular;  Laterality: N/A;   CERVICAL SPINE SURGERY  2003   ENDARTERECTOMY FEMORAL Left 05/24/2019   Procedure: ENDARTERECTOMY FEMORAL;  Surgeon: [name], MD;  Location: [facility];  Service: Vascular;  Laterality: Left;   KNEE SURGERY     LEFT HEART CATHETERIZATION WITH CORONARY ANGIOGRAM N/A 09/03/2011   Procedure: LEFT HEART CATHETERIZATION WITH CORONARY ANGIOGRAM;  Surgeon: Laverda Page, MD;  Location: [facility] CATH LAB;  Service: Cardiovascular;  Laterality: N/A;   MANDIBLE SURGERY     NECK SURGERY     PATCH ANGIOPLASTY Left 05/24/2019   Procedure: Patch Angioplasty Superficial Femoral Artery;  Surgeon: [name], MD;  Location: [facility] OR;  Service: Vascular;  Laterality: Left;    Social History   Socioeconomic History   Marital status: Divorced    Spouse name: Not on file   Number of children: Not on file   Years of education: Not on file   Highest education level: Not on file  Occupational History   Not on file  Tobacco Use   Smoking status: Former    Packs/day: 2.00     Years: 40.00    Total pack years: 80.00    Types: Cigarettes    Quit date: 11/05/1985    Years since quitting: 36.9   Smokeless tobacco: Current    Types: Chew  Vaping Use   Vaping Use: Never used  Substance and Sexual Activity   Alcohol use: No   Drug use: No   Sexual activity: Not on file  Other Topics Concern   Not on file  Social History Narrative   Not on file   Social Determinants of Health   Financial Resource Strain: Not on file  Food Insecurity: Not on file  Transportation Needs: Not on file  Physical Activity: Not on file  Stress: Not on file  Social Connections: Not on file    History reviewed. No pertinent family history.  Outpatient Encounter Medications as of 10/22/2022  Medication Sig   aspirin 81 MG EC tablet Take 81 mg by mouth daily.    carvedilol (COREG) 6.25 MG tablet Take 6.25 mg by mouth 2 (two) times daily.   Continuous Blood Gluc Receiver (FREESTYLE LIBRE 2 READER) DEVI As directed   Continuous Blood Gluc Sensor (FREESTYLE LIBRE 2 SENSOR) MISC  1 Piece by Does not apply route every 14 (fourteen) days.   furosemide (LASIX) 20 MG tablet Take 20 mg by mouth 2 (two) times daily.   HUMALOG KWIKPEN 100 UNIT/ML KwikPen INJECT 10 TO 16 UNITS UNDER THE SKIN THREE TIMES DAILY WITH MEALS   LANTUS SOLOSTAR 100 UNIT/ML Solostar Pen Inject 60 Units into the skin at bedtime.   rosuvastatin (CRESTOR) 20 MG tablet Take 20 mg by mouth at bedtime.   Vitamin D, Ergocalciferol, (DRISDOL) 1.25 MG (50000 UNIT) CAPS capsule TAKE 1 CAPSULE BY MOUTH EVERY 7 DAYS   [DISCONTINUED] tiZANidine (ZANAFLEX) 4 MG capsule Take 1 capsule (4 mg total) by mouth 3 (three) times daily as needed for muscle spasms.   No facility-administered encounter medications on file as of 10/22/2022.    ALLERGIES: Allergies  Allergen Reactions   Lisinopril Cough    VACCINATION STATUS: Immunization History  Administered Date(s) Administered   Influenza, High Dose Seasonal PF 06/13/2014,  07/24/2016, 07/15/2017, 07/29/2018   Moderna Sars-Covid-2 Vaccination 12/08/2019, 01/05/2020   Pneumococcal Conjugate-13 06/13/2014   Pneumococcal Polysaccharide-23 04/23/2012    Diabetes He presents for his follow-up diabetic visit. He has type 2 diabetes mellitus. Onset time: He was diagnosed at approximate age of 28 years. His disease course has been worsening. Pertinent negatives for hypoglycemia include no confusion, dizziness, headaches, hunger, pallor, seizures, sweats or tremors. Associated symptoms include blurred vision. Pertinent negatives for diabetes include no chest pain, no fatigue, no polydipsia, no polyphagia, no polyuria and no weakness. Pertinent negatives for hypoglycemia complications include no blackouts and no required assistance. Symptoms are worsening. Diabetic complications include heart disease and nephropathy. Risk factors for coronary artery disease include diabetes mellitus, dyslipidemia, male sex, hypertension, sedentary lifestyle and tobacco exposure. Current diabetic treatment includes insulin injections and oral agent (monotherapy). He is compliant with treatment none of the time. His weight is stable. He is following a generally unhealthy diet. When asked about meal planning, he reported none. He has not had a previous visit with a dietitian. He never participates in exercise. His home blood glucose trend is increasing steadily. His breakfast blood glucose range is generally 180-200 mg/dl. His lunch blood glucose range is generally 180-200 mg/dl. His dinner blood glucose range is generally 180-200 mg/dl. His bedtime blood glucose range is generally 180-200 mg/dl. His overall blood glucose range is 180-200 mg/dl. (He presents with his CGM device.  His point-of-care A1c is increasing to 8.7%.  His AGP report shows 56% time range, 42% level 1 hyperglycemia.  No significant hypoglycemia.  Admittedly, he has been missing his Lantus injection opportunities 1-2 nights per week.)  An ACE inhibitor/angiotensin II receptor blocker is being taken. Eye exam is current.  Hypertension This is a chronic problem. The current episode started more than 1 year ago. The problem is controlled. Associated symptoms include blurred vision. Pertinent negatives include no chest pain, headaches, neck pain, palpitations, shortness of breath or sweats. Risk factors for coronary artery disease include diabetes mellitus, dyslipidemia, male gender, obesity, smoking/tobacco exposure, sedentary lifestyle and family history. Past treatments include angiotensin blockers. Hypertensive end-organ damage includes CAD/MI.  Hyperlipidemia This is a chronic problem. The current episode started more than 1 year ago. Exacerbating diseases include diabetes and obesity. Factors aggravating his hyperlipidemia include smoking. Pertinent negatives include no chest pain, myalgias or shortness of breath. Current antihyperlipidemic treatment includes statins. Risk factors for coronary artery disease include dyslipidemia, diabetes mellitus, hypertension, male sex, obesity and a sedentary lifestyle.   Review of Systems  Constitutional:  Negative for chills, fatigue, fever and unexpected weight change.  HENT:  Negative for dental problem, mouth sores and trouble swallowing.   Eyes:  Positive for blurred vision. Negative for visual disturbance.  Respiratory:  Negative for cough, choking, chest tightness, shortness of breath and wheezing.   Cardiovascular:  Negative for chest pain, palpitations and leg swelling.  Gastrointestinal:  Negative for abdominal distention, abdominal pain, constipation, diarrhea, nausea and vomiting.  Endocrine: Negative for polydipsia, polyphagia and polyuria.  Genitourinary:  Negative for dysuria, flank pain, hematuria and urgency.  Musculoskeletal:  Negative for back pain, gait problem, myalgias and neck pain.  Skin:  Negative for pallor, rash and wound.  Neurological:  Negative for dizziness,  tremors, seizures, syncope, weakness, numbness and headaches.  Psychiatric/Behavioral:  Negative for confusion and dysphoric mood.     Objective:    BP 132/64   Pulse 76   Ht '5\' 7"'$  (1.702 m)   Wt 247 lb (112 kg)   BMI 38.69 kg/m   Wt Readings from Last 3 Encounters:  10/22/22 247 lb (112 kg)  08/21/22 245 lb 12.8 oz (111.5 kg)  07/18/22 244 lb 6.4 oz (110.9 kg)       CMP     Component Value Date/Time   NA 141 09/14/2022 0000   NA 137 11/06/2015 0950   K 4.7 09/14/2022 0000   K 4.6 11/06/2015 0950   CL 99 09/14/2022 0000   CL 101 10/27/2012 1045   CO2 25 (A) 09/14/2022 0000   CO2 28 11/06/2015 0950   GLUCOSE 250 (H) 04/07/2022 1428   GLUCOSE 309 (H) 11/06/2015 0950   GLUCOSE 174 (H) 10/27/2012 1045   BUN 30 (H) 04/07/2022 1428   BUN 33 (H) 12/18/2021 1109   BUN 12.2 11/06/2015 0950   CREATININE 2.0 (A) 09/14/2022 0000   CREATININE 2.05 (H) 04/07/2022 1428   CREATININE 1.2 11/06/2015 0950   CALCIUM 9.9 09/14/2022 0000   CALCIUM 9.9 11/06/2015 0950   PROT 7.3 04/07/2022 1428   PROT 5.9 (L) 12/18/2021 1109   PROT 6.8 11/06/2015 0950   ALBUMIN 4.2 09/14/2022 0000   ALBUMIN 4.3 12/18/2021 1109   ALBUMIN 4.1 11/06/2015 0950   AST 12 (A) 09/14/2022 0000   AST 12 11/06/2015 0950   ALT 14 09/14/2022 0000   ALT 12 11/06/2015 0950   ALKPHOS 111 09/14/2022 0000   ALKPHOS 94 11/06/2015 0950   BILITOT 0.7 04/07/2022 1428   BILITOT 0.3 12/18/2021 1109   BILITOT 0.63 11/06/2015 0950   GFRNONAA 33 (L) 04/07/2022 1428   GFRAA 54 (L) 05/25/2019 0254     Diabetic Labs (most recent): Lab Results  Component Value Date   HGBA1C 8.7 (A) 10/22/2022   HGBA1C 11.4 09/14/2022   HGBA1C 8.4 (A) 07/18/2022   MICROALBUR 22 12/27/2020   MICROALBUR 36 08/14/2020    Lipid Panel     Component Value Date/Time   CHOL 105 09/14/2022 0000   TRIG 76 09/14/2022 0000   HDL 30 (A) 09/14/2022 0000   LDLCALC 59 09/14/2022 0000     Assessment & Plan:   1. Type 2 diabetes mellitus  with stage 3 chronic kidney disease, with long-term current use of insulin (HCC)  - Cody Wood has currently uncontrolled symptomatic type 2 DM since 77 years of age.  He presents with his CGM device.  His point-of-care A1c is increasing to 8.7%, overall improving from 11.4%.  His AGP report shows 56% time range, 42% level 1 hyperglycemia.  No significant hypoglycemia.  Admittedly, he has been missing his Lantus injection opportunities 1-2 nights per week.   Recent labs reviewed. - I had a long discussion with him about the progressive nature of diabetes and the pathology behind its complications. -his diabetes is complicated by renal artery disease, CKD, random intermittent severe hypoglycemia and he remains at exceedingly high high risk for more acute and chronic complications which include CAD, CVA, CKD, retinopathy, and neuropathy. These are all discussed in detail with him.  - I have counseled him on diet management  by adopting a carbohydrate restricted/protein rich diet.  - he acknowledges that there is a room for improvement in his food and drink choices. - Suggestion is made for him to avoid simple carbohydrates  from his diet including Cakes, Sweet Desserts, Ice Cream, Soda (diet and regular), Sweet Tea, Candies, Chips, Cookies, Store Bought Juices, Alcohol , Artificial Sweeteners,  Coffee Creamer, and "Sugar-free" Products, Lemonade. This will help patient to have more stable blood glucose profile and potentially avoid unintended weight gain.   - I encouraged him to switch to  unprocessed or minimally processed complex starch and increased protein intake (animal or plant source), fruits, and vegetables.  - he is advised to stick to a routine mealtimes to eat 3 meals  a day and avoid unnecessary snacks ( to snack only to correct hypoglycemia).   - he will be scheduled with Jearld Fenton, RDN, CDE for individualized diabetes education.  - I have approached him with the  following individualized plan to manage diabetes and patient agrees:   -In light of his ongoing glycemic burden, he will continue to need intensive treatment with basal/bolus insulin in order for him to achieve and maintain control of diabetes to target.    He is benefiting from his CGM.  Advised to continue to wear at all times, bring his device with him.  He is not sharing data with [place], encouraged to do so.   -He is advised to continue Lantus 60 units nightly,  continue NovoLog 10  - 16 units 3 times daily AC for Premeal blood glucose readings above 90 mg/day.     - He  is advised to call clinic for hypoglycemia below 70 or persistent hyperglycemia above 200 mg per DL.  - he is warned not to take insulin without proper monitoring per orders.   - he is not a candidate for metformin  nor SGLT2 inhibitors due to concurrent renal insufficiency.  - he will be considered for incretin therapy as appropriate next visit.  - Patient specific target  A1c;  LDL, HDL, Triglycerides, and  Waist Circumference were discussed in detail.  2) Blood Pressure /Hypertension:  -His blood pressure is controlled to target.  he is advised to continue his current medications including losartan 50 mg p.o. daily with breakfast .  3) Lipids/Hyperlipidemia:   Review of his recent lipid panel showed significantly improved LDL at 69 improving from 91.  He is advised to continue lovastatin 20 mg p.o. daily at bedtime. - Side effects and precautions discussed with him.  4)  Weight/Diet:  Body mass index is 38.69 kg/m.  -   clearly complicating his diabetes care.  I discussed with him the fact that loss of 5 - 10% of his  current body weight will have the most impact on his diabetes management.  CDE Consult will be initiated . Exercise, and detailed carbohydrates information provided  -  detailed on discharge instructions.  5) Chronic Care/Health Maintenance:  -he  is on ACEI/ARB and Statin medications and  is encouraged  to initiate and continue to follow up with Ophthalmology, Dentist,  Podiatrist at least yearly or according to recommendations, and advised to  stay away from smoking. I have recommended yearly flu vaccine and pneumonia vaccine at least every 5 years; moderate intensity exercise for up to 150 minutes weekly; and  sleep for at least 7 hours a day.  He has superficial, granulating, recent infection healing  on left tibia area, advised to continue to get care at wound center.    He has just completed a course of antibiotics treatment.    - he is  advised to maintain close follow up with Heywood Bene, PA-C for primary care needs, as well as his other providers for optimal and coordinated care.  I spent 26 minutes in the care of the patient today including review of labs from [facility], Lipids, Thyroid Function, Hematology (current and previous including abstractions from other facilities); face-to-face time discussing  his blood glucose readings/logs, discussing hypoglycemia and hyperglycemia episodes and symptoms, medications doses, his options of short and long term treatment based on the latest standards of care / guidelines;  discussion about incorporating lifestyle medicine;  and documenting the encounter. Risk reduction counseling performed per USPSTF guidelines to reduce  obesity and cardiovascular risk factors.     Please refer to Patient Instructions for Blood Glucose Monitoring and Insulin/Medications Dosing Guide"  in media tab for additional information. Please  also refer to " Patient Self Inventory" in the Media  tab for reviewed elements of pertinent patient history.  Kym Groom Inabinet participated in the discussions, expressed understanding, and voiced agreement with the above plans.  All questions were answered to his satisfaction. he is encouraged to contact clinic should he have any questions or concerns prior to his return visit.   Follow up plan: - Return in about 4 months (around  02/20/2023) for Bring Meter/CGM Device/Logs- A1c in Office.  Glade Lloyd, MD [facility] [address] [place], [place] [address] Phone: [phone]  Fax: [phone]    10/22/2022, 2:08 PM  This note was partially dictated with voice recognition software. Similar sounding words can be transcribed inadequately or may not  be corrected upon review.

## 2022-11-09 ENCOUNTER — Other Ambulatory Visit: Payer: Self-pay | Admitting: "Endocrinology

## 2022-11-09 DIAGNOSIS — C8190 Hodgkin lymphoma, unspecified, unspecified site: Secondary | ICD-10-CM

## 2022-12-02 ENCOUNTER — Ambulatory Visit: Payer: Medicare Other | Admitting: Podiatry

## 2022-12-09 ENCOUNTER — Other Ambulatory Visit: Payer: Self-pay | Admitting: "Endocrinology

## 2022-12-09 DIAGNOSIS — C8190 Hodgkin lymphoma, unspecified, unspecified site: Secondary | ICD-10-CM

## 2022-12-19 ENCOUNTER — Other Ambulatory Visit: Payer: Self-pay | Admitting: "Endocrinology

## 2023-01-07 ENCOUNTER — Encounter: Payer: Self-pay | Admitting: Podiatry

## 2023-01-07 ENCOUNTER — Ambulatory Visit: Payer: Medicare Other | Admitting: Podiatry

## 2023-01-07 DIAGNOSIS — B351 Tinea unguium: Secondary | ICD-10-CM

## 2023-01-07 DIAGNOSIS — M79674 Pain in right toe(s): Secondary | ICD-10-CM

## 2023-01-07 DIAGNOSIS — M79675 Pain in left toe(s): Secondary | ICD-10-CM

## 2023-01-07 NOTE — Progress Notes (Signed)
This patient returns to my office for at risk foot care.  This patient requires this care by a professional since this patient will be at risk due to having PAD,  CKD and DM.  This patient is unable to cut nails himself since the patient cannot reach his nails.These nails are painful walking and wearing shoes.  This patient presents for at risk foot care today.  General Appearance  Alert, conversant and in no acute stress.  Vascular  Dorsalis pedis and posterior tibial  pulses are  not palpable due to swelling.  bilaterally.  Capillary return is within normal limits  bilaterally. Temperature is within normal limits  bilaterally.  Neurologic  Senn-Weinstein monofilament wire test within normal limits  bilaterally. Muscle power within normal limits bilaterally.  Nails Thick disfigured discolored nails with subungual debris  from hallux to fifth toes bilaterally. No evidence of bacterial infection or drainage bilaterally.  Orthopedic  No limitations of motion  feet .  No crepitus or effusions noted.  No bony pathology or digital deformities noted.  Skin  normotropic skin with no porokeratosis noted bilaterally.  No signs of infections or ulcers noted.     Onychomycosis  Pain in right toes  Pain in left toes  Consent was obtained for treatment procedures.   Mechanical debridement of nails 1-5  bilaterally performed with a nail nipper.  Filed with dremel without incident.    Return office visit  3 onths                    Told patient to return for periodic foot care and evaluation due to potential at risk complications.   Tayvion Lauder DPM   

## 2023-02-15 ENCOUNTER — Other Ambulatory Visit: Payer: Self-pay | Admitting: "Endocrinology

## 2023-02-15 DIAGNOSIS — N183 Chronic kidney disease, stage 3 unspecified: Secondary | ICD-10-CM

## 2023-02-20 ENCOUNTER — Ambulatory Visit: Payer: Medicare Other | Admitting: "Endocrinology

## 2023-02-20 ENCOUNTER — Encounter: Payer: Self-pay | Admitting: "Endocrinology

## 2023-02-20 VITALS — BP 92/50 | HR 76 | Ht 67.0 in | Wt 247.0 lb

## 2023-02-20 DIAGNOSIS — N183 Chronic kidney disease, stage 3 unspecified: Secondary | ICD-10-CM

## 2023-02-20 DIAGNOSIS — E782 Mixed hyperlipidemia: Secondary | ICD-10-CM

## 2023-02-20 DIAGNOSIS — I10 Essential (primary) hypertension: Secondary | ICD-10-CM

## 2023-02-20 DIAGNOSIS — E1122 Type 2 diabetes mellitus with diabetic chronic kidney disease: Secondary | ICD-10-CM | POA: Diagnosis not present

## 2023-02-20 DIAGNOSIS — Z9189 Other specified personal risk factors, not elsewhere classified: Secondary | ICD-10-CM

## 2023-02-20 DIAGNOSIS — Z794 Long term (current) use of insulin: Secondary | ICD-10-CM | POA: Diagnosis not present

## 2023-02-20 LAB — POCT GLYCOSYLATED HEMOGLOBIN (HGB A1C): HbA1c, POC (controlled diabetic range): 8.6 % — AB (ref 0.0–7.0)

## 2023-02-20 MED ORDER — LOSARTAN POTASSIUM 25 MG PO TABS: 25.0000 mg | ORAL_TABLET | Freq: Every day | ORAL | 1 refills | Status: AC

## 2023-02-20 MED ORDER — LANTUS SOLOSTAR 100 UNIT/ML ~~LOC~~ SOPN: 70.0000 [IU] | PEN_INJECTOR | Freq: Every day | SUBCUTANEOUS | 2 refills | Status: DC

## 2023-02-20 NOTE — Progress Notes (Signed)
02/20/2023, 4:46 PM  Endocrinology follow-up note   Subjective:    Patient ID: Cody Wood, male    DOB: July 26, 1946.  Cody Wood is being seen in follow-up after he was seen consultation for management of currently uncontrolled symptomatic diabetes in May 2020 requested by  Roger Kill, PA-C.  Past Medical History:  Diagnosis Date   Blockage of coronary artery of heart (HCC)    Nonobstructive CAD by cath 2012   Chronic bronchitis (HCC)    Diabetes mellitus    metformin   hodgkins lymphoma dx'd 06/2008   Hypertension    Peripheral vascular disease (HCC)     Past Surgical History:  Procedure Laterality Date   ABDOMINAL AORTOGRAM W/LOWER EXTREMITY N/A 05/12/2019   Procedure: ABDOMINAL AORTOGRAM W/LOWER EXTREMITY;  Surgeon: Cephus Shelling, MD;  Location: [facility];  Service: Cardiovascular;  Laterality: N/A;   CERVICAL SPINE SURGERY  2003   ENDARTERECTOMY FEMORAL Left 05/24/2019   Procedure: ENDARTERECTOMY FEMORAL;  Surgeon: Cephus Shelling, MD;  Location: [facility] OR;  Service: Vascular;  Laterality: Left;   KNEE SURGERY     LEFT HEART CATHETERIZATION WITH CORONARY ANGIOGRAM N/A 09/03/2011   Procedure: LEFT HEART CATHETERIZATION WITH CORONARY ANGIOGRAM;  Surgeon: Pamella Pert, MD;  Location: [facility] CATH LAB;  Service: Cardiovascular;  Laterality: N/A;   MANDIBLE SURGERY     NECK SURGERY     PATCH ANGIOPLASTY Left 05/24/2019   Procedure: Patch Angioplasty Superficial Femoral Artery;  Surgeon: Cephus Shelling, MD;  Location: [facility] OR;  Service: Vascular;  Laterality: Left;    Social History   Socioeconomic History   Marital status: Divorced    Spouse name: Not on file   Number of children: Not on file   Years of education: Not on file   Highest education level: Not on file  Occupational History   Not on file  Tobacco Use   Smoking status: Former    Packs/day: 2.00     Years: 40.00    Additional pack years: 0.00    Total pack years: 80.00    Types: Cigarettes    Quit date: 11/05/1985    Years since quitting: 37.3   Smokeless tobacco: Current    Types: Chew  Vaping Use   Vaping Use: Never used  Substance and Sexual Activity   Alcohol use: No   Drug use: No   Sexual activity: Not on file  Other Topics Concern   Not on file  Social History Narrative   Not on file   Social Determinants of Health   Financial Resource Strain: Not on file  Food Insecurity: Not on file  Transportation Needs: Not on file  Physical Activity: Not on file  Stress: Not on file  Social Connections: Not on file    History reviewed. No pertinent family history.  Outpatient Encounter Medications as of 02/20/2023  Medication Sig   aspirin 81 MG EC tablet Take 81 mg by mouth daily.    carvedilol (COREG) 6.25 MG tablet Take 6.25 mg by mouth 2 (two) times daily.   Continuous Blood Gluc Receiver (FREESTYLE LIBRE 2 READER) DEVI As directed   Continuous Blood  Gluc Sensor (FREESTYLE LIBRE 2 SENSOR) MISC 1 Piece by Does not apply route every 14 (fourteen) days.   furosemide (LASIX) 20 MG tablet Take 20 mg by mouth 2 (two) times daily.   HUMALOG KWIKPEN 100 UNIT/ML KwikPen ADMINISTER 10 TO 16 UNITS UNDER THE SKIN THREE TIMES DAILY WITH MEALS (Patient taking differently: 10-20 Units.)   LANTUS SOLOSTAR 100 UNIT/ML Solostar Pen Inject 70 Units into the skin at bedtime.   losartan (COZAAR) 25 MG tablet Take 1 tablet (25 mg total) by mouth daily.   rosuvastatin (CRESTOR) 20 MG tablet Take 20 mg by mouth at bedtime.   Vitamin D, Ergocalciferol, (DRISDOL) 1.25 MG (50000 UNIT) CAPS capsule TAKE 1 CAPSULE BY MOUTH EVERY 7 DAYS   [DISCONTINUED] LANTUS SOLOSTAR 100 UNIT/ML Solostar Pen ADMINISTER 60 UNITS UNDER THE SKIN AT BEDTIME   [DISCONTINUED] losartan (COZAAR) 50 MG tablet Take 50 mg by mouth daily.   No facility-administered encounter medications on file as of 02/20/2023.     ALLERGIES: Allergies  Allergen Reactions   Lisinopril Cough    VACCINATION STATUS: Immunization History  Administered Date(s) Administered   Influenza, High Dose Seasonal PF 06/13/2014, 07/24/2016, 07/15/2017, 07/29/2018   Moderna Sars-Covid-2 Vaccination 12/08/2019, 01/05/2020   Pneumococcal Conjugate-13 06/13/2014   Pneumococcal Polysaccharide-23 04/23/2012    Diabetes He presents for his follow-up diabetic visit. He has type 2 diabetes mellitus. Onset time: He was diagnosed at approximate age of 44 years. His disease course has been worsening. Pertinent negatives for hypoglycemia include no confusion, dizziness, headaches, hunger, pallor, seizures, sweats or tremors. Associated symptoms include blurred vision. Pertinent negatives for diabetes include no chest pain, no fatigue, no polydipsia, no polyphagia, no polyuria and no weakness. Pertinent negatives for hypoglycemia complications include no blackouts and no required assistance. Symptoms are worsening. Diabetic complications include heart disease and nephropathy. Risk factors for coronary artery disease include diabetes mellitus, dyslipidemia, male sex, hypertension, sedentary lifestyle and tobacco exposure. Current diabetic treatment includes insulin injections and oral agent (monotherapy). He is compliant with treatment none of the time. His weight is stable. He is following a generally unhealthy diet. When asked about meal planning, he reported none. He has not had a previous visit with a dietitian. He never participates in exercise. His home blood glucose trend is increasing steadily. His breakfast blood glucose range is generally 180-200 mg/dl. His lunch blood glucose range is generally 180-200 mg/dl. His dinner blood glucose range is generally 180-200 mg/dl. His bedtime blood glucose range is generally 180-200 mg/dl. His overall blood glucose range is 180-200 mg/dl. (He presents with his CGM device.  His point-of-care A1c is 8.6%.   He is freestyle libre CGM AGP report shows 40% time in range, 50% level 1 hyperglycemia, 10% level 2 hyperglycemia.  He has no hypoglycemia.   ) An ACE inhibitor/angiotensin II receptor blocker is being taken. Eye exam is current.  Hypertension This is a chronic problem. The current episode started more than 1 year ago. The problem is controlled. Associated symptoms include blurred vision. Pertinent negatives include no chest pain, headaches, neck pain, palpitations, shortness of breath or sweats. Risk factors for coronary artery disease include diabetes mellitus, dyslipidemia, male gender, obesity, smoking/tobacco exposure, sedentary lifestyle and family history. Past treatments include angiotensin blockers. Hypertensive end-organ damage includes CAD/MI.  Hyperlipidemia This is a chronic problem. The current episode started more than 1 year ago. Exacerbating diseases include diabetes and obesity. Factors aggravating his hyperlipidemia include smoking. Pertinent negatives include no chest pain, myalgias or shortness of  breath. Current antihyperlipidemic treatment includes statins. Risk factors for coronary artery disease include dyslipidemia, diabetes mellitus, hypertension, male sex, obesity and a sedentary lifestyle.     Objective:    BP (!) 92/50   Pulse 76   Ht 5\' 7"  (1.702 m)   Wt 247 lb (112 kg)   BMI 38.69 kg/m   Wt Readings from Last 3 Encounters:  02/20/23 247 lb (112 kg)  10/22/22 247 lb (112 kg)  08/21/22 245 lb 12.8 oz (111.5 kg)      CMP     Component Value Date/Time   NA 141 09/14/2022 0000   NA 137 11/06/2015 0950   K 4.7 09/14/2022 0000   K 4.6 11/06/2015 0950   CL 99 09/14/2022 0000   CL 101 10/27/2012 1045   CO2 25 (A) 09/14/2022 0000   CO2 28 11/06/2015 0950   GLUCOSE 250 (H) 04/07/2022 1428   GLUCOSE 309 (H) 11/06/2015 0950   GLUCOSE 174 (H) 10/27/2012 1045   BUN 30 (H) 04/07/2022 1428   BUN 33 (H) 12/18/2021 1109   BUN 12.2 11/06/2015 0950   CREATININE  2.0 (A) 09/14/2022 0000   CREATININE 2.05 (H) 04/07/2022 1428   CREATININE 1.2 11/06/2015 0950   CALCIUM 9.9 09/14/2022 0000   CALCIUM 9.9 11/06/2015 0950   PROT 7.3 04/07/2022 1428   PROT 5.9 (L) 12/18/2021 1109   PROT 6.8 11/06/2015 0950   ALBUMIN 4.2 09/14/2022 0000   ALBUMIN 4.3 12/18/2021 1109   ALBUMIN 4.1 11/06/2015 0950   AST 12 (A) 09/14/2022 0000   AST 12 11/06/2015 0950   ALT 14 09/14/2022 0000   ALT 12 11/06/2015 0950   ALKPHOS 111 09/14/2022 0000   ALKPHOS 94 11/06/2015 0950   BILITOT 0.7 04/07/2022 1428   BILITOT 0.3 12/18/2021 1109   BILITOT 0.63 11/06/2015 0950   GFRNONAA 33 (L) 04/07/2022 1428   GFRAA 54 (L) 05/25/2019 0254     Diabetic Labs (most recent): Lab Results  Component Value Date   HGBA1C 8.6 (A) 02/20/2023   HGBA1C 8.7 (A) 10/22/2022   HGBA1C 11.4 09/14/2022   MICROALBUR 22 12/27/2020   MICROALBUR 36 08/14/2020    Lipid Panel     Component Value Date/Time   CHOL 105 09/14/2022 0000   TRIG 76 09/14/2022 0000   HDL 30 (A) 09/14/2022 0000   LDLCALC 59 09/14/2022 0000     Assessment & Plan:   1. Type 2 diabetes mellitus with stage 3 chronic kidney disease, with long-term current use of insulin (HCC)  - Cody Wood has currently uncontrolled symptomatic type 2 DM since 77 years of age.  He presents with his CGM device.  His point-of-care A1c is 8.6%.  He is freestyle libre CGM AGP report shows 40% time in range, 50% level 1 hyperglycemia, 10% level 2 hyperglycemia.  He has no hypoglycemia.     Recent labs reviewed. - I had a long discussion with him about the progressive nature of diabetes and the pathology behind its complications. -his diabetes is complicated by renal artery disease, CKD, random intermittent severe hypoglycemia and he remains at exceedingly high high risk for more acute and chronic complications which include CAD, CVA, CKD, retinopathy, and neuropathy. These are all discussed in detail with him.  - I have counseled  him on diet management  by adopting a carbohydrate restricted/protein rich diet. - he acknowledges that there is a room for improvement in his food and drink choices. - Suggestion is made for him to  avoid simple carbohydrates  from his diet including Cakes, Sweet Desserts, Ice Cream, Soda (diet and regular), Sweet Tea, Candies, Chips, Cookies, Store Bought Juices, Alcohol , Artificial Sweeteners,  Coffee Creamer, and "Sugar-free" Products, Lemonade. This will help patient to have more stable blood glucose profile and potentially avoid unintended weight gain.  - I encouraged him to switch to  unprocessed or minimally processed complex starch and increased protein intake (animal or plant source), fruits, and vegetables.  - he is advised to stick to a routine mealtimes to eat 3 meals  a day and avoid unnecessary snacks ( to snack only to correct hypoglycemia).   - he will be scheduled with Norm Salt, RDN, CDE for individualized diabetes education.  - I have approached him with the following individualized plan to manage diabetes and patient agrees:   -In light of his ongoing glycemic burden, he will continue to need intensive treatment with basal/bolus insulin in order for him to achieve control of diabetes  to target.   -Accordingly, he is advised to increase his Lantus to 70 units nightly, advised to continue Humalog  10  - 16 units 3 times daily AC for Premeal blood glucose readings above 90 mg/day.   -He is encouraged to continue to use his CGM continuously.   - He  is advised to call clinic for hypoglycemia below 70 or persistent hyperglycemia above 200 mg per DL.  - he is warned not to take insulin without proper monitoring per orders.   - he is not a candidate for metformin  nor SGLT2 inhibitors due to concurrent renal insufficiency. -He is advised to continue glipizide 5 mg p.o. daily at breakfast.  - Patient specific target  A1c;  LDL, HDL, Triglycerides, and  Waist Circumference  were discussed in detail.  2) Blood Pressure /Hypertension:  -His blood pressure is controlled to below target.  It seems like he is on polypharmacy for blood pressure.  His losartan is lowered to 25 mg p.o. daily at breakfast along with his carvedilol 6.25 mg p.o. twice daily.  He also uses Lasix as needed.    3) Lipids/Hyperlipidemia:   Review of his recent lipid panel showed significantly improved profile with LDL at 59.  He is advised to continue lovastatin 20 mg p.o. daily at bedtime.    - Side effects and precautions discussed with him.  4)  Weight/Diet:  Body mass index is 38.69 kg/m.  -   clearly complicating his diabetes care.  I discussed with him the fact that loss of 5 - 10% of his  current body weight will have the most impact on his diabetes management.  CDE Consult will be initiated . Exercise, and detailed carbohydrates information provided  -  detailed on discharge instructions.  5) Chronic Care/Health Maintenance:  -he  is on ACEI/ARB and Statin medications and  is encouraged to initiate and continue to follow up with Ophthalmology, Dentist,  Podiatrist at least yearly or according to recommendations, and advised to  stay away from smoking. I have recommended yearly flu vaccine and pneumonia vaccine at least every 5 years; moderate intensity exercise for up to 150 minutes weekly; and  sleep for at least 7 hours a day.  He has superficial, granulating, recent infection healing  on left tibia area, advised to continue to get care at wound center.    He has just completed a course of antibiotics treatment.    - he is  advised to maintain close follow up with Mayford Knife,  Karis Juba, PA-C for primary care needs, as well as his other providers for optimal and coordinated care.   I spent  41  minutes in the care of the patient today including review of labs from CMP, Lipids, Thyroid Function, Hematology (current and previous including abstractions from other facilities); face-to-face  time discussing  his blood glucose readings/logs, discussing hypoglycemia and hyperglycemia episodes and symptoms, medications doses, his options of short and long term treatment based on the latest standards of care / guidelines;  discussion about incorporating lifestyle medicine;  and documenting the encounter. Risk reduction counseling performed per USPSTF guidelines to reduce  obesity and cardiovascular risk factors.     Please refer to Patient Instructions for Blood Glucose Monitoring and Insulin/Medications Dosing Guide"  in media tab for additional information. Please  also refer to " Patient Self Inventory" in the Media  tab for reviewed elements of pertinent patient history.  Cody Wood participated in the discussions, expressed understanding, and voiced agreement with the above plans.  All questions were answered to his satisfaction. he is encouraged to contact clinic should he have any questions or concerns prior to his return visit.    Follow up plan: - Return in about 3 months (around 05/23/2023) for F/U with Pre-visit Labs, Meter/CGM/Logs, A1c here.  Marquis Lunch, MD [facility] [address] [place], [place] [address] Phone: [phone]  Fax: [phone]    02/20/2023, 4:46 PM  This note was partially dictated with voice recognition software. Similar sounding words can be transcribed inadequately or may not  be corrected upon review.

## 2023-02-20 NOTE — Patient Instructions (Signed)

## 2023-03-11 ENCOUNTER — Other Ambulatory Visit: Payer: Self-pay | Admitting: "Endocrinology

## 2023-03-28 ENCOUNTER — Telehealth: Payer: Self-pay | Admitting: "Endocrinology

## 2023-03-28 ENCOUNTER — Other Ambulatory Visit: Payer: Self-pay | Admitting: *Deleted

## 2023-03-28 DIAGNOSIS — Z794 Long term (current) use of insulin: Secondary | ICD-10-CM

## 2023-03-28 DIAGNOSIS — E1122 Type 2 diabetes mellitus with diabetic chronic kidney disease: Secondary | ICD-10-CM

## 2023-03-28 MED ORDER — COMFORT EZ PEN NEEDLES 32G X 8 MM MISC
3 refills | Status: DC
Start: 2023-03-28 — End: 2023-05-28

## 2023-03-28 NOTE — Telephone Encounter (Signed)
The refill has been sent in to the patient's pharmacy.

## 2023-03-28 NOTE — Telephone Encounter (Signed)
Pt's daughter called and said that he needs Pen Needles called in. She would like the 32 ml x 8. Those are the ones that do not hurt him so bad. [facility] [place]

## 2023-04-02 ENCOUNTER — Other Ambulatory Visit: Payer: Self-pay | Admitting: "Endocrinology

## 2023-04-02 DIAGNOSIS — Z794 Long term (current) use of insulin: Secondary | ICD-10-CM

## 2023-04-08 ENCOUNTER — Ambulatory Visit: Payer: Medicare Other | Admitting: Podiatry

## 2023-04-16 ENCOUNTER — Ambulatory Visit (INDEPENDENT_AMBULATORY_CARE_PROVIDER_SITE_OTHER): Payer: Medicare Other | Admitting: Podiatry

## 2023-04-16 ENCOUNTER — Encounter: Payer: Self-pay | Admitting: Podiatry

## 2023-04-16 DIAGNOSIS — R609 Edema, unspecified: Secondary | ICD-10-CM

## 2023-04-16 DIAGNOSIS — B351 Tinea unguium: Secondary | ICD-10-CM

## 2023-04-16 DIAGNOSIS — E1151 Type 2 diabetes mellitus with diabetic peripheral angiopathy without gangrene: Secondary | ICD-10-CM

## 2023-04-16 DIAGNOSIS — M79675 Pain in left toe(s): Secondary | ICD-10-CM

## 2023-04-16 DIAGNOSIS — M79674 Pain in right toe(s): Secondary | ICD-10-CM

## 2023-04-16 NOTE — Progress Notes (Signed)
This patient returns to my office for at risk foot care.  This patient requires this care by a professional since this patient will be at risk due to having PAD,  CKD and DM.  This patient is unable to cut nails himself since the patient cannot reach his nails.These nails are painful walking and wearing shoes.  This patient presents for at risk foot care today.  General Appearance  Alert, conversant and in no acute stress.  Vascular  Dorsalis pedis and posterior tibial  pulses are  not palpable due to swelling.  bilaterally.  Capillary return is within normal limits  bilaterally. Temperature is within normal limits  bilaterally.  Neurologic  Senn-Weinstein monofilament wire test within normal limits  bilaterally. Muscle power within normal limits bilaterally.  Nails Thick disfigured discolored nails with subungual debris  from hallux to fifth toes bilaterally. No evidence of bacterial infection or drainage bilaterally.  Orthopedic  No limitations of motion  feet .  No crepitus or effusions noted.  No bony pathology or digital deformities noted.  Skin  normotropic skin with no porokeratosis noted bilaterally.  No signs of infections or ulcers noted.     Onychomycosis  Pain in right toes  Pain in left toes  Consent was obtained for treatment procedures.   Mechanical debridement of nails 1-5  bilaterally performed with a nail nipper.  Filed with dremel without incident.    Return office visit  3 months                    Told patient to return for periodic foot care and evaluation due to potential at risk complications.   Helane Gunther DPM

## 2023-05-04 ENCOUNTER — Other Ambulatory Visit: Payer: Self-pay | Admitting: "Endocrinology

## 2023-05-04 DIAGNOSIS — Z9189 Other specified personal risk factors, not elsewhere classified: Secondary | ICD-10-CM

## 2023-05-21 LAB — TSH: TSH: 1.86 u[IU]/mL (ref 0.450–4.500)

## 2023-05-21 LAB — COMPREHENSIVE METABOLIC PANEL
AST: 13 IU/L (ref 0–40)
Sodium: 140 mmol/L (ref 134–144)

## 2023-05-21 LAB — LIPID PANEL: VLDL Cholesterol Cal: 14 mg/dL (ref 5–40)

## 2023-05-21 LAB — T4, FREE: Free T4: 1.39 ng/dL (ref 0.82–1.77)

## 2023-05-28 ENCOUNTER — Telehealth: Payer: Self-pay | Admitting: "Endocrinology

## 2023-05-28 ENCOUNTER — Ambulatory Visit: Payer: Medicare Other | Admitting: "Endocrinology

## 2023-05-28 ENCOUNTER — Encounter: Payer: Self-pay | Admitting: "Endocrinology

## 2023-05-28 VITALS — BP 110/58 | HR 60 | Ht 67.0 in | Wt 256.0 lb

## 2023-05-28 DIAGNOSIS — N183 Chronic kidney disease, stage 3 unspecified: Secondary | ICD-10-CM | POA: Diagnosis not present

## 2023-05-28 DIAGNOSIS — Z794 Long term (current) use of insulin: Secondary | ICD-10-CM | POA: Diagnosis not present

## 2023-05-28 DIAGNOSIS — E1122 Type 2 diabetes mellitus with diabetic chronic kidney disease: Secondary | ICD-10-CM

## 2023-05-28 DIAGNOSIS — E782 Mixed hyperlipidemia: Secondary | ICD-10-CM

## 2023-05-28 DIAGNOSIS — I10 Essential (primary) hypertension: Secondary | ICD-10-CM

## 2023-05-28 DIAGNOSIS — Z9189 Other specified personal risk factors, not elsewhere classified: Secondary | ICD-10-CM

## 2023-05-28 LAB — POCT GLYCOSYLATED HEMOGLOBIN (HGB A1C): HbA1c, POC (controlled diabetic range): 8.6 % — AB (ref 0.0–7.0)

## 2023-05-28 MED ORDER — EMPAGLIFLOZIN 10 MG PO TABS: 10.0000 mg | ORAL_TABLET | Freq: Every day | ORAL | 1 refills | Status: DC

## 2023-05-28 MED ORDER — LANTUS SOLOSTAR 100 UNIT/ML ~~LOC~~ SOPN
80.0000 [IU] | PEN_INJECTOR | Freq: Every day | SUBCUTANEOUS | 1 refills | Status: DC
Start: 2023-05-28 — End: 2023-12-01

## 2023-05-28 MED ORDER — COMFORT EZ PEN NEEDLES 32G X 8 MM MISC
3 refills | Status: AC
Start: 2023-05-28 — End: ?

## 2023-05-28 NOTE — Progress Notes (Signed)
05/28/2023, 3:25 PM  Endocrinology follow-up note   Subjective:    Patient ID: Cody Wood, male    DOB: 06-07-1946.  Cody Wood is being seen in follow-up after he was seen consultation for management of currently uncontrolled symptomatic diabetes in May 2020 requested by  Roger Kill, PA-C.  Past Medical History:  Diagnosis Date   Blockage of coronary artery of heart (HCC)    Nonobstructive CAD by cath 2012   Chronic bronchitis (HCC)    Diabetes mellitus    metformin   hodgkins lymphoma dx'd 06/2008   Hypertension    Peripheral vascular disease (HCC)     Past Surgical History:  Procedure Laterality Date   ABDOMINAL AORTOGRAM W/LOWER EXTREMITY N/A 05/12/2019   Procedure: ABDOMINAL AORTOGRAM W/LOWER EXTREMITY;  Surgeon: Cephus Shelling, MD;  Location: [facility];  Service: Cardiovascular;  Laterality: N/A;   CERVICAL SPINE SURGERY  2003   ENDARTERECTOMY FEMORAL Left 05/24/2019   Procedure: ENDARTERECTOMY FEMORAL;  Surgeon: Cephus Shelling, MD;  Location: [facility] OR;  Service: Vascular;  Laterality: Left;   KNEE SURGERY     LEFT HEART CATHETERIZATION WITH CORONARY ANGIOGRAM N/A 09/03/2011   Procedure: LEFT HEART CATHETERIZATION WITH CORONARY ANGIOGRAM;  Surgeon: Pamella Pert, MD;  Location: [facility] CATH LAB;  Service: Cardiovascular;  Laterality: N/A;   MANDIBLE SURGERY     NECK SURGERY     PATCH ANGIOPLASTY Left 05/24/2019   Procedure: Patch Angioplasty Superficial Femoral Artery;  Surgeon: Cephus Shelling, MD;  Location: [facility] OR;  Service: Vascular;  Laterality: Left;    Social History   Socioeconomic History   Marital status: Divorced    Spouse name: Not on file   Number of children: Not on file   Years of education: Not on file   Highest education level: Not on file  Occupational History   Not on file  Tobacco Use   Smoking status: Former    Current  packs/day: 0.00    Average packs/day: 2.0 packs/day for 40.0 years (80.0 ttl pk-yrs)    Types: Cigarettes    Start date: 11/05/1946    Quit date: 11/05/1985    Years since quitting: 37.5   Smokeless tobacco: Current    Types: Chew  Vaping Use   Vaping status: Never Used  Substance and Sexual Activity   Alcohol use: No   Drug use: No   Sexual activity: Not on file  Other Topics Concern   Not on file  Social History Narrative   Not on file   Social Determinants of Health   Financial Resource Strain: Not on file  Food Insecurity: Low Risk  (03/25/2023)   [facility]   Food vital sign    Within the past 12 months, you worried that your food would run out before you got money to buy more: Never true    Within the past 12 months, the food you bought just didn't last and you didn't have money to get more. : Never true  Transportation Needs: Unmet Transportation Needs (03/25/2023)   Received [facility], [facility]   Transportation    In the  past 12 months, has lack of reliable transportation kept you from medical appointments, meetings, work or from getting things needed for daily living? : Yes  Physical Activity: Not on file  Stress: Not on file  Social Connections: Not on file    History reviewed. No pertinent family history.  Outpatient Encounter Medications as of 05/28/2023  Medication Sig   empagliflozin (JARDIANCE) 10 MG TABS tablet Take 1 tablet (10 mg total) by mouth daily before breakfast.   aspirin 81 MG EC tablet Take 81 mg by mouth daily.    carvedilol (COREG) 6.25 MG tablet Take 6.25 mg by mouth 2 (two) times daily.   Continuous Blood Gluc Receiver (FREESTYLE LIBRE 2 READER) DEVI As directed   Continuous Blood Gluc Sensor (FREESTYLE LIBRE 2 SENSOR) MISC 1 Piece by Does not apply route every 14 (fourteen) days.   furosemide (LASIX) 20 MG tablet Take 20 mg by mouth 2 (two) times daily.   HUMALOG KWIKPEN 100 UNIT/ML KwikPen ADMINISTER  10 TO 16 UNITS UNDER THE SKIN THREE TIMES DAILY WITH MEALS   Insulin Pen Needle (COMFORT EZ PEN NEEDLES) 32G X 8 MM MISC Use as directed to inject insulin into the skin   LANTUS SOLOSTAR 100 UNIT/ML Solostar Pen Inject 80 Units into the skin at bedtime.   losartan (COZAAR) 25 MG tablet Take 1 tablet (25 mg total) by mouth daily.   rosuvastatin (CRESTOR) 20 MG tablet Take 20 mg by mouth at bedtime.   Vitamin D, Ergocalciferol, (DRISDOL) 1.25 MG (50000 UNIT) CAPS capsule TAKE 1 CAPSULE BY MOUTH EVERY 7 DAYS   [DISCONTINUED] Insulin Pen Needle (COMFORT EZ PEN NEEDLES) 32G X 8 MM MISC Use as directed to inject insulin into the skin   [DISCONTINUED] LANTUS SOLOSTAR 100 UNIT/ML Solostar Pen Inject 70 Units into the skin at bedtime.   No facility-administered encounter medications on file as of 05/28/2023.    ALLERGIES: Allergies  Allergen Reactions   Lisinopril Cough    VACCINATION STATUS: Immunization History  Administered Date(s) Administered   Influenza, High Dose Seasonal PF 06/13/2014, 07/24/2016, 07/15/2017, 07/29/2018   Moderna Sars-Covid-2 Vaccination 12/08/2019, 01/05/2020   Pneumococcal Conjugate-13 06/13/2014   Pneumococcal Polysaccharide-23 04/23/2012    Diabetes He presents for his follow-up diabetic visit. He has type 2 diabetes mellitus. Onset time: He was diagnosed at approximate age of 44 years. His disease course has been improving. Pertinent negatives for hypoglycemia include no confusion, dizziness, headaches, hunger, pallor, seizures, sweats or tremors. Associated symptoms include blurred vision. Pertinent negatives for diabetes include no chest pain, no fatigue, no polydipsia, no polyphagia, no polyuria and no weakness. Pertinent negatives for hypoglycemia complications include no blackouts and no required assistance. Symptoms are worsening. Diabetic complications include heart disease and nephropathy. Risk factors for coronary artery disease include diabetes mellitus,  dyslipidemia, male sex, hypertension, sedentary lifestyle and tobacco exposure. Current diabetic treatment includes insulin injections and oral agent (monotherapy). He is compliant with treatment none of the time. His weight is increasing steadily. He is following a generally unhealthy diet. When asked about meal planning, he reported none. He has not had a previous visit with a dietitian. He never participates in exercise. His home blood glucose trend is fluctuating minimally. His breakfast blood glucose range is generally 180-200 mg/dl. His lunch blood glucose range is generally 140-180 mg/dl. His dinner blood glucose range is generally 180-200 mg/dl. His bedtime blood glucose range is generally 180-200 mg/dl. His overall blood glucose range is 180-200 mg/dl. (He presents with his CGM device.  His point-of-care A1c remains at 8.6%.  His freestyle libre device shows 49% time range, 45% level 1 hyperglycemia, 6% level 2 hyperglycemia.   He has no hypoglycemia.   ) An ACE inhibitor/angiotensin II receptor blocker is being taken. Eye exam is current.  Hypertension This is a chronic problem. The current episode started more than 1 year ago. The problem is controlled. Associated symptoms include blurred vision. Pertinent negatives include no chest pain, headaches, neck pain, palpitations, shortness of breath or sweats. Risk factors for coronary artery disease include diabetes mellitus, dyslipidemia, male gender, obesity, smoking/tobacco exposure, sedentary lifestyle and family history. Past treatments include angiotensin blockers. Hypertensive end-organ damage includes CAD/MI.  Hyperlipidemia This is a chronic problem. The current episode started more than 1 year ago. Exacerbating diseases include diabetes and obesity. Factors aggravating his hyperlipidemia include smoking. Pertinent negatives include no chest pain, myalgias or shortness of breath. Current antihyperlipidemic treatment includes statins. Risk factors  for coronary artery disease include dyslipidemia, diabetes mellitus, hypertension, male sex, obesity and a sedentary lifestyle.     Objective:    BP (!) 110/58   Pulse 60   Ht 5\' 7"  (1.702 m)   Wt 256 lb (116.1 kg)   BMI 40.10 kg/m   Wt Readings from Last 3 Encounters:  05/28/23 256 lb (116.1 kg)  02/20/23 247 lb (112 kg)  10/22/22 247 lb (112 kg)      CMP     Component Value Date/Time   NA 140 05/20/2023 0950   NA 137 11/06/2015 0950   K 5.5 (H) 05/20/2023 0950   K 4.6 11/06/2015 0950   CL 99 05/20/2023 0950   CL 101 10/27/2012 1045   CO2 24 05/20/2023 0950   CO2 28 11/06/2015 0950   GLUCOSE 152 (H) 05/20/2023 0950   GLUCOSE 250 (H) 04/07/2022 1428   GLUCOSE 309 (H) 11/06/2015 0950   GLUCOSE 174 (H) 10/27/2012 1045   BUN 35 (H) 05/20/2023 0950   BUN 12.2 11/06/2015 0950   CREATININE 1.77 (H) 05/20/2023 0950   CREATININE 1.2 11/06/2015 0950   CALCIUM 9.3 05/20/2023 0950   CALCIUM 9.9 11/06/2015 0950   PROT 6.3 05/20/2023 0950   PROT 6.8 11/06/2015 0950   ALBUMIN 4.3 05/20/2023 0950   ALBUMIN 4.1 11/06/2015 0950   AST 13 05/20/2023 0950   AST 12 11/06/2015 0950   ALT 11 05/20/2023 0950   ALT 12 11/06/2015 0950   ALKPHOS 99 05/20/2023 0950   ALKPHOS 94 11/06/2015 0950   BILITOT 0.4 05/20/2023 0950   BILITOT 0.63 11/06/2015 0950   GFRNONAA 33 (L) 04/07/2022 1428   GFRAA 54 (L) 05/25/2019 0254     Diabetic Labs (most recent): Lab Results  Component Value Date   HGBA1C 8.6 (A) 05/28/2023   HGBA1C 8.6 (A) 02/20/2023   HGBA1C 8.7 (A) 10/22/2022   MICROALBUR 22 12/27/2020   MICROALBUR 36 08/14/2020    Lipid Panel     Component Value Date/Time   CHOL 113 05/20/2023 0950   TRIG 64 05/20/2023 0950   HDL 30 (L) 05/20/2023 0950   CHOLHDL 3.8 05/20/2023 0950   LDLCALC 69 05/20/2023 0950     Assessment & Plan:   1. Type 2 diabetes mellitus with stage 3 chronic kidney disease, with long-term current use of insulin (HCC)  - Cody Wood has  currently uncontrolled symptomatic type 2 DM since 77 years of age.  He presents with his CGM device.  His point-of-care A1c remains at 8.6%.  His freestyle [place]  device shows 49% time range, 45% level 1 hyperglycemia, 6% level 2 hyperglycemia.   He has no hypoglycemia.     Recent labs reviewed. - I had a long discussion with him about the progressive nature of diabetes and the pathology behind its complications. -his diabetes is complicated by renal artery disease, CKD, random intermittent severe hypoglycemia and he remains at exceedingly high high risk for more acute and chronic complications which include CAD, CVA, CKD, retinopathy, and neuropathy. These are all discussed in detail with him.  - I have counseled him on diet management  by adopting a carbohydrate restricted/protein rich diet.  - he acknowledges that there is a room for improvement in his food and drink choices. - Suggestion is made for him to avoid simple carbohydrates  from his diet including Cakes, Sweet Desserts, Ice Cream, Soda (diet and regular), Sweet Tea, Candies, Chips, Cookies, Store Bought Juices, Alcohol , Artificial Sweeteners,  Coffee Creamer, and "Sugar-free" Products, Lemonade. This will help patient to have more stable blood glucose profile and potentially avoid unintended weight gain.  - I encouraged him to switch to  unprocessed or minimally processed complex starch and increased protein intake (animal or plant source), fruits, and vegetables.  - he is advised to stick to a routine mealtimes to eat 3 meals  a day and avoid unnecessary snacks ( to snack only to correct hypoglycemia).   - he will be scheduled with Norm Salt, RDN, CDE for individualized diabetes education.  - I have approached him with the following individualized plan to manage diabetes and patient agrees:   -In light of his ongoing glycemic burden, he will continue to need intensive treatment with basal/bolus insulin in order for him to  achieve control of diabetes to target. - Accordingly he is advised to increase his Lantus to 80 units nightly, he is advised to continue Humalog  10  - 16 units 3 times daily AC for Premeal blood glucose readings above 90 mg/day.   -He is encouraged to continue to use his CGM continuously.   - He  is advised to call clinic for hypoglycemia below 70 or persistent hyperglycemia above 200 mg per DL.  - he is warned not to take insulin without proper monitoring per orders.   -In light of his rising weight and his CKD, he may benefit from low-dose SGLT2 inhibitors.  I discussed and prescribed Jardiance 10 mg p.o. daily at breakfast.  Side effects and precautions discussed with him.   Patient specific target  A1c;  LDL, HDL, Triglycerides, and  Waist Circumference were discussed in detail.  2) Blood Pressure /Hypertension:  -His blood pressure is controlled to target.  It seems like he is on polypharmacy for blood pressure.  His losartan is lowered to 25 mg p.o. daily at breakfast along with his carvedilol 6.25 mg p.o. twice daily.  He also uses Lasix as needed.    3) Lipids/Hyperlipidemia:   Review of his recent lipid panel showed significantly improved profile with LDL at 69.  He is advised to continue lovastatin 20 mg p.o. nightly.  Side effects and precautions discussed with him.    4)  Weight/Diet:  Body mass index is 40.1 kg/m.  -   clearly complicating his diabetes care.  I discussed with him the fact that loss of 5 - 10% of his  current body weight will have the most impact on his diabetes management.  CDE Consult will be initiated . Exercise, and detailed carbohydrates information provided  -  detailed on discharge instructions.  5) Chronic Care/Health Maintenance:  -he  is on ACEI/ARB and Statin medications and  is encouraged to initiate and continue to follow up with Ophthalmology, Dentist,  Podiatrist at least yearly or according to recommendations, and advised to  stay away from smoking.  I have recommended yearly flu vaccine and pneumonia vaccine at least every 5 years; moderate intensity exercise for up to 150 minutes weekly; and  sleep for at least 7 hours a day.  - he is  advised to maintain close follow up with Roger Kill, PA-C for primary care needs, as well as his other providers for optimal and coordinated care.    I spent  26  minutes in the care of the patient today including review of labs from CMP, Lipids, Thyroid Function, Hematology (current and previous including abstractions from other facilities); face-to-face time discussing  his blood glucose readings/logs, discussing hypoglycemia and hyperglycemia episodes and symptoms, medications doses, his options of short and long term treatment based on the latest standards of care / guidelines;  discussion about incorporating lifestyle medicine;  and documenting the encounter. Risk reduction counseling performed per USPSTF guidelines to reduce  obesity and cardiovascular risk factors.     Please refer to Patient Instructions for Blood Glucose Monitoring and Insulin/Medications Dosing Guide"  in media tab for additional information. Please  also refer to " Patient Self Inventory" in the Media  tab for reviewed elements of pertinent patient history.  Rodman Key Ailes participated in the discussions, expressed understanding, and voiced agreement with the above plans.  All questions were answered to his satisfaction. he is encouraged to contact clinic should he have any questions or concerns prior to his return visit.   Follow up plan: - Return in about 4 months (around 09/27/2023) for Bring Meter/CGM Device/Logs- A1c in Office.  Marquis Lunch, MD [facility] [address] [place], [place] [address] Phone: [phone]  Fax: [phone]    05/28/2023, 3:25 PM  This note was partially dictated with voice recognition software. Similar sounding words can be  transcribed inadequately or may not  be corrected upon review.

## 2023-05-28 NOTE — Telephone Encounter (Signed)
I found her referral for prolia under media. Can you see what Dr Fransico Him wants to do about this?

## 2023-05-28 NOTE — Patient Instructions (Signed)
                                     Advice for Weight Management  -For most of us the best way to lose weight is by diet management. Generally speaking, diet management means consuming less calories intentionally which over time brings about progressive weight loss.  This can be achieved more effectively by avoiding ultra processed carbohydrates, processed meats, unhealthy fats.    It is critically important to know your numbers: how much calorie you are consuming and how much calorie you need. More importantly, our carbohydrates sources should be unprocessed naturally occurring  complex starch food items.  It is always important to balance nutrition also by  appropriate intake of proteins (mainly plant-based), healthy fats/oils, plenty of fruits and vegetables.   -The American College of Lifestyle Medicine (ACL M) recommends nutrition derived mostly from Whole Food, Plant Predominant Sources example an apple instead of applesauce or apple pie. Eat Plenty of vegetables, Mushrooms, fruits, Legumes, Whole Grains, Nuts, seeds in lieu of processed meats, processed snacks/pastries red meat, poultry, eggs.  Use only water or unsweetened tea for hydration.  The College also recommends the need to stay away from risky substances including alcohol, smoking; obtaining 7-9 hours of restorative sleep, at least 150 minutes of moderate intensity exercise weekly, importance of healthy social connections, and being mindful of stress and seek help when it is overwhelming.    -Sticking to a routine mealtime to eat 3 meals a day and avoiding unnecessary snacks is shown to have a big role in weight control. Under normal circumstances, the only time we burn stored energy is when we are hungry, so allow  some hunger to take place- hunger means no food between appropriate meal times, only water.  It is not advisable to starve.   -It is better to avoid simple carbohydrates including:  Cakes, Sweet Desserts, Ice Cream, Soda (diet and regular), Sweet Tea, Candies, Chips, Cookies, Store Bought Juices, Alcohol in Excess of  1-2 drinks a day, Lemonade,  Artificial Sweeteners, Doughnuts, Coffee Creamers, "Sugar-free" Products, etc, etc.  This is not a complete list.....    -Consulting with certified diabetes educators is proven to provide you with the most accurate and current information on diet.  Also, you may be  interested in discussing diet options/exchanges , we can schedule a visit with Cody Wood, RDN, CDE for individualized nutrition education.  -Exercise: If you are able: 30 -60 minutes a day ,4 days a week, or 150 minutes of moderate intensity exercise weekly.    The longer the better if tolerated.  Combine stretch, strength, and aerobic activities.  If you were told in the past that you have high risk for cardiovascular diseases, or if you are currently symptomatic, you may seek evaluation by your heart doctor prior to initiating moderate to intense exercise programs.                                  Additional Care Considerations for Diabetes/Prediabetes   -Diabetes  is a chronic disease.  The most important care consideration is regular follow-up with your diabetes care provider with the goal being avoiding or delaying its complications and to take advantage of advances in medications and technology.  If appropriate actions are taken early enough, type 2 diabetes can even be   reversed.  Seek information from the right source.  - Whole Food, Plant Predominant Nutrition is highly recommended: Eat Plenty of vegetables, Mushrooms, fruits, Legumes, Whole Grains, Nuts, seeds in lieu of processed meats, processed snacks/pastries red meat, poultry, eggs as recommended by American College of  Lifestyle Medicine (ACLM).  -Type 2 diabetes is known to coexist with other important comorbidities such as high blood pressure and high cholesterol.  It is critical to control not only the  diabetes but also the high blood pressure and high cholesterol to minimize and delay the risk of complications including coronary artery disease, stroke, amputations, blindness, etc.  The good news is that this diet recommendation for type 2 diabetes is also very helpful for managing high cholesterol and high blood blood pressure.  - Studies showed that people with diabetes will benefit from a class of medications known as ACE inhibitors and statins.  Unless there are specific reasons not to be on these medications, the standard of care is to consider getting one from these groups of medications at an optimal doses.  These medications are generally considered safe and proven to help protect the heart and the kidneys.    - People with diabetes are encouraged to initiate and maintain regular follow-up with eye doctors, foot doctors, dentists , and if necessary heart and kidney doctors.     - It is highly recommended that people with diabetes quit smoking or stay away from smoking, and get yearly  flu vaccine and pneumonia vaccine at least every 5 years.  See above for additional recommendations on exercise, sleep, stress management , and healthy social connections.      

## 2023-06-09 ENCOUNTER — Other Ambulatory Visit: Payer: Self-pay | Admitting: "Endocrinology

## 2023-07-23 ENCOUNTER — Ambulatory Visit: Payer: Medicare Other | Admitting: Podiatry

## 2023-10-07 ENCOUNTER — Ambulatory Visit: Payer: Medicare Other | Admitting: "Endocrinology

## 2023-10-22 ENCOUNTER — Other Ambulatory Visit: Payer: Self-pay

## 2023-11-07 ENCOUNTER — Telehealth: Payer: Self-pay | Admitting: "Endocrinology

## 2023-11-07 NOTE — Telephone Encounter (Signed)
 Called daughter back. No answer. VM not set up. [employer] form has been sent along with last office note but this was only for CGM. Not sure what issues he is having with insulin .

## 2023-11-07 NOTE — Telephone Encounter (Signed)
 Patient's daughter is asking for a call back [phone] [facility]. She said she is wondering if we work with [employer]. They need more info on his insulin 

## 2023-11-10 ENCOUNTER — Ambulatory Visit: Payer: Medicare Other | Admitting: "Endocrinology

## 2023-11-10 ENCOUNTER — Encounter: Payer: Self-pay | Admitting: "Endocrinology

## 2023-11-10 VITALS — BP 90/62 | HR 64

## 2023-11-10 DIAGNOSIS — E1122 Type 2 diabetes mellitus with diabetic chronic kidney disease: Secondary | ICD-10-CM | POA: Diagnosis not present

## 2023-11-10 DIAGNOSIS — Z9189 Other specified personal risk factors, not elsewhere classified: Secondary | ICD-10-CM

## 2023-11-10 DIAGNOSIS — N183 Chronic kidney disease, stage 3 unspecified: Secondary | ICD-10-CM

## 2023-11-10 DIAGNOSIS — E66812 Obesity, class 2: Secondary | ICD-10-CM

## 2023-11-10 DIAGNOSIS — Z794 Long term (current) use of insulin: Secondary | ICD-10-CM | POA: Diagnosis not present

## 2023-11-10 DIAGNOSIS — Z6838 Body mass index (BMI) 38.0-38.9, adult: Secondary | ICD-10-CM

## 2023-11-10 DIAGNOSIS — E782 Mixed hyperlipidemia: Secondary | ICD-10-CM

## 2023-11-10 DIAGNOSIS — I10 Essential (primary) hypertension: Secondary | ICD-10-CM

## 2023-11-10 LAB — POCT GLYCOSYLATED HEMOGLOBIN (HGB A1C): HbA1c, POC (controlled diabetic range): 10.6 % — AB (ref 0.0–7.0)

## 2023-11-10 MED ORDER — EMPAGLIFLOZIN 10 MG PO TABS: 10.0000 mg | ORAL_TABLET | Freq: Every day | ORAL | 1 refills | Status: DC

## 2023-11-10 NOTE — Patient Instructions (Signed)

## 2023-11-10 NOTE — Progress Notes (Signed)
 11/10/2023, 4:24 PM  Endocrinology follow-up note   Subjective:    Patient ID: Cody Wood, male    DOB: 1946/08/29.  Cody Wood is being seen in follow-up after he was seen consultation for management of currently uncontrolled symptomatic diabetes in May 2020 requested by  Jenell Mirza, PA-C.  Past Medical History:  Diagnosis Date   Blockage of coronary artery of heart (HCC)    Nonobstructive CAD by cath 2012   Chronic bronchitis (HCC)    Diabetes mellitus    metformin   hodgkins lymphoma dx'd 06/2008   Hypertension    Peripheral vascular disease (HCC)     Past Surgical History:  Procedure Laterality Date   ABDOMINAL AORTOGRAM W/LOWER EXTREMITY N/A 05/12/2019   Procedure: ABDOMINAL AORTOGRAM W/LOWER EXTREMITY;  Surgeon: Young Hensen, MD;  Location: [facility];  Service: Cardiovascular;  Laterality: N/A;   CERVICAL SPINE SURGERY  2003   ENDARTERECTOMY FEMORAL Left 05/24/2019   Procedure: ENDARTERECTOMY FEMORAL;  Surgeon: Young Hensen, MD;  Location: [facility] OR;  Service: Vascular;  Laterality: Left;   KNEE SURGERY     LEFT HEART CATHETERIZATION WITH CORONARY ANGIOGRAM N/A 09/03/2011   Procedure: LEFT HEART CATHETERIZATION WITH CORONARY ANGIOGRAM;  Surgeon: Jessica Morn, MD;  Location: [facility] CATH LAB;  Service: Cardiovascular;  Laterality: N/A;   MANDIBLE SURGERY     NECK SURGERY     PATCH ANGIOPLASTY Left 05/24/2019   Procedure: Patch Angioplasty Superficial Femoral Artery;  Surgeon: Young Hensen, MD;  Location: [facility] OR;  Service: Vascular;  Laterality: Left;    Social History   Socioeconomic History   Marital status: Divorced    Spouse name: Not on file   Number of children: Not on file   Years of education: Not on file   Highest education level: Not on file  Occupational History   Not on file  Tobacco Use   Smoking status: Former    Current  packs/day: 0.00    Average packs/day: 2.0 packs/day for 40.0 years (80.0 ttl pk-yrs)    Types: Cigarettes    Start date: 11/05/1946    Quit date: 11/05/1985    Years since quitting: 38.0   Smokeless tobacco: Current    Types: Chew  Vaping Use   Vaping status: Never Used  Substance and Sexual Activity   Alcohol use: No   Drug use: No   Sexual activity: Not on file  Other Topics Concern   Not on file  Social History Narrative   Not on file   Social Drivers of Health   Financial Resource Strain: Not on file  Food Insecurity: Low Risk  (07/16/2023)   Received from [facility]   Hunger Vital Sign    Worried About Running Out of Food in the Last Year: Never true    Ran Out of Food in the Last Year: Never true  Transportation Needs: No Transportation Needs (07/16/2023)   Received from [facility]    In the past 12 months, has lack of reliable transportation kept you from medical appointments, meetings, work or from getting things needed for daily living? : No  Physical  Activity: Not on file  Stress: Not on file  Social Connections: Not on file    History reviewed. No pertinent family history.  Outpatient Encounter Medications as of 11/10/2023  Medication Sig   aspirin  81 MG EC tablet Take 81 mg by mouth daily.    carvedilol  (COREG ) 6.25 MG tablet Take 6.25 mg by mouth 2 (two) times daily.   Continuous Blood Gluc Receiver (FREESTYLE LIBRE 2 READER) DEVI As directed   Continuous Blood Gluc Sensor (FREESTYLE LIBRE 2 SENSOR) MISC 1 Piece by Does not apply route every 14 (fourteen) days.   empagliflozin  (JARDIANCE ) 10 MG TABS tablet Take 1 tablet (10 mg total) by mouth daily before breakfast.   furosemide (LASIX) 20 MG tablet Take 20 mg by mouth 2 (two) times daily.   HUMALOG  KWIKPEN 100 UNIT/ML KwikPen ADMINISTER 10 TO 16 UNITS UNDER THE SKIN THREE TIMES DAILY WITH MEALS   Insulin  Pen Needle (COMFORT EZ PEN NEEDLES) 32G X 8 MM MISC Use as directed to inject insulin   into the skin   LANTUS  SOLOSTAR 100 UNIT/ML Solostar Pen Inject 80 Units into the skin at bedtime.   losartan  (COZAAR ) 25 MG tablet Take 1 tablet (25 mg total) by mouth daily.   rosuvastatin (CRESTOR) 20 MG tablet Take 20 mg by mouth at bedtime.   Vitamin D , Ergocalciferol , (DRISDOL ) 1.25 MG (50000 UNIT) CAPS capsule TAKE 1 CAPSULE BY MOUTH EVERY 7 DAYS   [DISCONTINUED] empagliflozin  (JARDIANCE ) 10 MG TABS tablet Take 1 tablet (10 mg total) by mouth daily before breakfast. (Patient not taking: Reported on 11/10/2023)   No facility-administered encounter medications on file as of 11/10/2023.    ALLERGIES: Allergies  Allergen Reactions   Lisinopril Cough    VACCINATION STATUS: Immunization History  Administered Date(s) Administered   Influenza, High Dose Seasonal PF 06/13/2014, 07/24/2016, 07/15/2017, 07/29/2018   Moderna Sars-Covid-2 Vaccination 12/08/2019, 01/05/2020   Pneumococcal Conjugate-13 06/13/2014   Pneumococcal Polysaccharide-23 04/23/2012    Diabetes He presents for his follow-up diabetic visit. He has type 2 diabetes mellitus. Onset time: He was diagnosed at approximate age of 44 years. His disease course has been worsening. Pertinent negatives for hypoglycemia include no confusion, dizziness, headaches, hunger, pallor, seizures, sweats or tremors. Associated symptoms include blurred vision. Pertinent negatives for diabetes include no chest pain, no fatigue, no polydipsia, no polyphagia, no polyuria and no weakness. Pertinent negatives for hypoglycemia complications include no blackouts and no required assistance. Symptoms are worsening. Diabetic complications include heart disease and nephropathy. Risk factors for coronary artery disease include diabetes mellitus, dyslipidemia, male sex, hypertension, sedentary lifestyle and tobacco exposure. Current diabetic treatment includes insulin  injections and oral agent (monotherapy). He is compliant with treatment none of the time. His  weight is increasing steadily. He is following a generally unhealthy diet. When asked about meal planning, he reported none. He has not had a previous visit with a dietitian. He never participates in exercise. His home blood glucose trend is increasing steadily. His breakfast blood glucose range is generally >200 mg/dl. His lunch blood glucose range is generally >200 mg/dl. His dinner blood glucose range is generally >200 mg/dl. His bedtime blood glucose range is generally >200 mg/dl. His overall blood glucose range is >200 mg/dl. (He presents with his CGM showing worsening glycemic profile.  Mainly due to significant interruptions in his prandial insulin  injections, his recent AGP shows 8% time in range, 43% level 1 hyperglycemia 49% level 2 hyperglycemia.  Has no hypoglycemia.  His point-of-care A1c is 10.6% increasing from  8.6%.     He has no hypoglycemia.  Patient did not call clinic for hyperglycemia.  His average blood glucose is 247 mg per DL for the last 14 days. ) An ACE inhibitor/angiotensin II receptor blocker is being taken. Eye exam is current.  Hypertension This is a chronic problem. The current episode started more than 1 year ago. The problem is controlled. Associated symptoms include blurred vision. Pertinent negatives include no chest pain, headaches, neck pain, palpitations, shortness of breath or sweats. Risk factors for coronary artery disease include diabetes mellitus, dyslipidemia, male gender, obesity, smoking/tobacco exposure, sedentary lifestyle and family history. Past treatments include angiotensin blockers. Hypertensive end-organ damage includes CAD/MI.  Hyperlipidemia This is a chronic problem. The current episode started more than 1 year ago. Exacerbating diseases include diabetes and obesity. Factors aggravating his hyperlipidemia include smoking. Pertinent negatives include no chest pain, myalgias or shortness of breath. Current antihyperlipidemic treatment includes statins.  Risk factors for coronary artery disease include dyslipidemia, diabetes mellitus, hypertension, male sex, obesity and a sedentary lifestyle.     Objective:    BP 90/62   Pulse 64   Wt Readings from Last 3 Encounters:  05/28/23 256 lb (116.1 kg)  02/20/23 247 lb (112 kg)  10/22/22 247 lb (112 kg)      CMP     Component Value Date/Time   NA 140 05/20/2023 0950   NA 137 11/06/2015 0950   K 5.5 (H) 05/20/2023 0950   K 4.6 11/06/2015 0950   CL 99 05/20/2023 0950   CL 101 10/27/2012 1045   CO2 24 05/20/2023 0950   CO2 28 11/06/2015 0950   GLUCOSE 152 (H) 05/20/2023 0950   GLUCOSE 250 (H) 04/07/2022 1428   GLUCOSE 309 (H) 11/06/2015 0950   GLUCOSE 174 (H) 10/27/2012 1045   BUN 35 (H) 05/20/2023 0950   BUN 12.2 11/06/2015 0950   CREATININE 1.77 (H) 05/20/2023 0950   CREATININE 1.2 11/06/2015 0950   CALCIUM 9.3 05/20/2023 0950   CALCIUM 9.9 11/06/2015 0950   PROT 6.3 05/20/2023 0950   PROT 6.8 11/06/2015 0950   ALBUMIN 4.3 05/20/2023 0950   ALBUMIN 4.1 11/06/2015 0950   AST 13 05/20/2023 0950   AST 12 11/06/2015 0950   ALT 11 05/20/2023 0950   ALT 12 11/06/2015 0950   ALKPHOS 99 05/20/2023 0950   ALKPHOS 94 11/06/2015 0950   BILITOT 0.4 05/20/2023 0950   BILITOT 0.63 11/06/2015 0950   GFRNONAA 33 (L) 04/07/2022 1428   GFRAA 54 (L) 05/25/2019 0254     Diabetic Labs (most recent): Lab Results  Component Value Date   HGBA1C 10.6 (A) 11/10/2023   HGBA1C 8.6 (A) 05/28/2023   HGBA1C 8.6 (A) 02/20/2023   MICROALBUR 22 12/27/2020   MICROALBUR 36 08/14/2020    Lipid Panel     Component Value Date/Time   CHOL 113 05/20/2023 0950   TRIG 64 05/20/2023 0950   HDL 30 (L) 05/20/2023 0950   CHOLHDL 3.8 05/20/2023 0950   LDLCALC 69 05/20/2023 0950     Assessment & Plan:   1. Type 2 diabetes mellitus with stage 3 chronic kidney disease, with long-term current use of insulin  (HCC)  - Cody Wood has currently uncontrolled symptomatic type 2 DM since 78 years of  age.  He presents with his CGM showing worsening glycemic profile.  Mainly due to significant interruptions in his prandial insulin  injections, his recent AGP shows 8% time in range, 43% level 1 hyperglycemia 49% level 2 hyperglycemia.  Has no hypoglycemia.  His point-of-care A1c is 10.6% increasing from 8.6%.     He has no hypoglycemia.  Patient did not call clinic for hyperglycemia.  His average blood glucose is 247 mg per DL for the last 14 days.   Recent labs reviewed. - I had a long discussion with him about the progressive nature of diabetes and the pathology behind its complications. -his diabetes is complicated by renal artery disease, CKD, random intermittent severe hypoglycemia and he remains at exceedingly high high risk for more acute and chronic complications which include CAD, CVA, CKD, retinopathy, and neuropathy. These are all discussed in detail with him.  - I have counseled him on diet management  by adopting a carbohydrate restricted/protein rich diet. - he acknowledges that there is a room for improvement in his food and drink choices. - Suggestion is made for him to avoid simple carbohydrates  from his diet including Cakes, Sweet Desserts, Ice Cream, Soda (diet and regular), Sweet Tea, Candies, Chips, Cookies, Store Bought Juices, Alcohol , Artificial Sweeteners,  Coffee Creamer, and "Sugar-free" Products, Lemonade. This will help patient to have more stable blood glucose profile and potentially avoid unintended weight gain.   - I encouraged him to switch to  unprocessed or minimally processed complex starch and increased protein intake (animal or plant source), fruits, and vegetables.  - he is advised to stick to a routine mealtimes to eat 3 meals  a day and avoid unnecessary snacks ( to snack only to correct hypoglycemia).   - he will be scheduled with Penny Crumpton, RDN, CDE for individualized diabetes education.  - I have approached him with the following individualized  plan to manage diabetes and patient agrees:   -In light of his ongoing hyperglycemic burden, he will continue to need intensive treatment with basal/bolus insulin  in order for him to achieve control of diabetes to target.  He is being assisted by his daughter.   I advised the family to continue Lantus  80 units nightly, continue Humalog  10-16  units 3 times daily AC for Premeal blood glucose readings above 90 mg/day.   -He is encouraged to continue to use his CGM continuously.   - He  is advised to call clinic for hypoglycemia below 70 or persistent hyperglycemia above 200 mg per DL.  - he is warned not to take insulin  without proper monitoring per orders.   -During his last visit, a prescription for Jardiance  was given, however this medication was never filled.  I discussed and prescribed Jardiance  again at 10 mg p.o. daily at breakfast. Side effects and precautions are discussed.     Patient specific target  A1c;  LDL, HDL, Triglycerides, and  Waist Circumference were discussed in detail.  2) Blood Pressure /Hypertension:  -His blood pressure is controlled to target.  It seems like he is on polypharmacy for blood pressure.  His losartan  is lowered to 25 mg p.o. daily at breakfast along with his carvedilol  6.25 mg p.o. twice daily.  He also uses Lasix as needed.    3) Lipids/Hyperlipidemia:   Review of his recent lipid panel showed significantly improved profile with LDL at 69.  He is advised to continue lovastatin 20 mg p.o. nightly.   4)  Weight/Diet:  There is no height or weight on file to calculate BMI.  -   clearly complicating his diabetes care.  I discussed with him the fact that loss of 5 - 10% of his  current body weight will have the  most impact on his diabetes management.  CDE Consult will be initiated . Exercise, and detailed carbohydrates information provided  -  detailed on discharge instructions.  5) Chronic Care/Health Maintenance:  -he  is on ACEI/ARB and Statin medications  and  is encouraged to initiate and continue to follow up with Ophthalmology, Dentist,  Podiatrist at least yearly or according to recommendations, and advised to  stay away from smoking. I have recommended yearly flu vaccine and pneumonia vaccine at least every 5 years; moderate intensity exercise for up to 150 minutes weekly; and  sleep for at least 7 hours a day.  - he is  advised to maintain close follow up with Jenell Mirza, PA-C for primary care needs, as well as his other providers for optimal and coordinated care.   I spent  42  minutes in the care of the patient today including review of labs from CMP, Lipids, Thyroid Function, Hematology (current and previous including abstractions from other facilities); face-to-face time discussing  his blood glucose readings/logs, discussing hypoglycemia and hyperglycemia episodes and symptoms, medications doses, his options of short and long term treatment based on the latest standards of care / guidelines;  discussion about incorporating lifestyle medicine;  and documenting the encounter. Risk reduction counseling performed per USPSTF guidelines to reduce  obesity and cardiovascular risk factors.     Please refer to Patient Instructions for Blood Glucose Monitoring and Insulin /Medications Dosing Guide"  in media tab for additional information. Please  also refer to " Patient Self Inventory" in the Media  tab for reviewed elements of pertinent patient history.  Cody Wood participated in the discussions, expressed understanding, and voiced agreement with the above plans.  All questions were answered to his satisfaction. he is encouraged to contact clinic should he have any questions or concerns prior to his return visit.    Follow up plan: - Return in about 4 months (around 03/09/2024) for F/U with Pre-visit Labs, Meter/CGM/Logs, A1c here.  Kalvin Orf, MD [facility] [address] [place], [place] [address] Phone: [phone]  Fax: [phone]    11/10/2023, 4:24 PM  This note was partially dictated with voice recognition software. Similar sounding words can be transcribed inadequately or may not  be corrected upon review.

## 2023-12-01 ENCOUNTER — Other Ambulatory Visit: Payer: Self-pay | Admitting: "Endocrinology

## 2023-12-01 DIAGNOSIS — Z9189 Other specified personal risk factors, not elsewhere classified: Secondary | ICD-10-CM

## 2024-01-14 ENCOUNTER — Other Ambulatory Visit: Payer: Self-pay | Admitting: "Endocrinology

## 2024-01-14 DIAGNOSIS — E1122 Type 2 diabetes mellitus with diabetic chronic kidney disease: Secondary | ICD-10-CM

## 2024-01-15 LAB — LIPID PANEL
Chol/HDL Ratio: 3.6 ratio (ref 0.0–5.0)
Cholesterol, Total: 109 mg/dL (ref 100–199)
HDL: 30 mg/dL — ABNORMAL LOW (ref >39)
LDL Chol Calc (NIH): 63 mg/dL (ref 0–99)
Triglycerides: 79 mg/dL (ref 0–149)
VLDL Cholesterol Cal: 16 mg/dL (ref 5–40)

## 2024-01-15 LAB — T4, FREE: Free T4: 1.5 ng/dL (ref 0.82–1.77)

## 2024-01-15 LAB — COMPREHENSIVE METABOLIC PANEL WITH GFR
ALT: 8 IU/L (ref 0–44)
AST: 12 IU/L (ref 0–40)
Albumin: 4.2 g/dL (ref 3.8–4.8)
Alkaline Phosphatase: 106 IU/L (ref 44–121)
BUN/Creatinine Ratio: 17 (ref 10–24)
BUN: 30 mg/dL — ABNORMAL HIGH (ref 8–27)
Bilirubin Total: 0.5 mg/dL (ref 0.0–1.2)
CO2: 23 mmol/L (ref 20–29)
Calcium: 9.5 mg/dL (ref 8.6–10.2)
Chloride: 99 mmol/L (ref 96–106)
Creatinine, Ser: 1.77 mg/dL — ABNORMAL HIGH (ref 0.76–1.27)
Globulin, Total: 2.2 g/dL (ref 1.5–4.5)
Glucose: 121 mg/dL — ABNORMAL HIGH (ref 70–99)
Potassium: 4.3 mmol/L (ref 3.5–5.2)
Sodium: 141 mmol/L (ref 134–144)
Total Protein: 6.4 g/dL (ref 6.0–8.5)
eGFR: 39 mL/min/{1.73_m2} — ABNORMAL LOW (ref >59)

## 2024-01-15 LAB — TSH: TSH: 1.83 u[IU]/mL (ref 0.450–4.500)

## 2024-03-03 ENCOUNTER — Telehealth: Payer: Self-pay | Admitting: "Endocrinology

## 2024-03-03 NOTE — Telephone Encounter (Signed)
 Pt did labs in April, does he need to redo?  If so can you put an order in

## 2024-03-09 ENCOUNTER — Ambulatory Visit: Payer: Medicare Other | Admitting: "Endocrinology

## 2024-04-14 ENCOUNTER — Ambulatory Visit (INDEPENDENT_AMBULATORY_CARE_PROVIDER_SITE_OTHER): Admitting: "Endocrinology

## 2024-04-14 ENCOUNTER — Encounter: Payer: Self-pay | Admitting: "Endocrinology

## 2024-04-14 VITALS — BP 132/62 | HR 56

## 2024-04-14 DIAGNOSIS — N183 Chronic kidney disease, stage 3 unspecified: Secondary | ICD-10-CM

## 2024-04-14 DIAGNOSIS — I10 Essential (primary) hypertension: Secondary | ICD-10-CM | POA: Diagnosis not present

## 2024-04-14 DIAGNOSIS — Z794 Long term (current) use of insulin: Secondary | ICD-10-CM | POA: Diagnosis not present

## 2024-04-14 DIAGNOSIS — E1122 Type 2 diabetes mellitus with diabetic chronic kidney disease: Secondary | ICD-10-CM

## 2024-04-14 DIAGNOSIS — E782 Mixed hyperlipidemia: Secondary | ICD-10-CM | POA: Diagnosis not present

## 2024-04-14 DIAGNOSIS — Z9189 Other specified personal risk factors, not elsewhere classified: Secondary | ICD-10-CM

## 2024-04-14 LAB — POCT GLYCOSYLATED HEMOGLOBIN (HGB A1C): HbA1c, POC (controlled diabetic range): 8.8 % — AB (ref 0.0–7.0)

## 2024-04-14 NOTE — Progress Notes (Signed)
 04/14/2024, 4:48 PM  Endocrinology follow-up note   Subjective:    Patient ID: Cody Wood, male    DOB: 1946-03-02.  Cody Wood is being seen in follow-up after he was seen consultation for management of currently uncontrolled symptomatic diabetes in May 2020 requested by  Trudy Elodia PARAS, PA-C.  Past Medical History:  Diagnosis Date   Blockage of coronary artery of heart (HCC)    Nonobstructive CAD by cath 2012   Chronic bronchitis (HCC)    Diabetes mellitus    metformin   hodgkins lymphoma dx'd 06/2008   Hypertension    Peripheral vascular disease (HCC)     Past Surgical History:  Procedure Laterality Date   ABDOMINAL AORTOGRAM W/LOWER EXTREMITY N/A 05/12/2019   Procedure: ABDOMINAL AORTOGRAM W/LOWER EXTREMITY;  Surgeon: Gretta Lonni PARAS, MD;  Location: [facility];  Service: Cardiovascular;  Laterality: N/A;   CERVICAL SPINE SURGERY  2003   ENDARTERECTOMY FEMORAL Left 05/24/2019   Procedure: ENDARTERECTOMY FEMORAL;  Surgeon: Gretta Lonni PARAS, MD;  Location: [facility] OR;  Service: Vascular;  Laterality: Left;   KNEE SURGERY     LEFT HEART CATHETERIZATION WITH CORONARY ANGIOGRAM N/A 09/03/2011   Procedure: LEFT HEART CATHETERIZATION WITH CORONARY ANGIOGRAM;  Surgeon: Erick JONELLE Bergamo, MD;  Location: [facility] CATH LAB;  Service: Cardiovascular;  Laterality: N/A;   MANDIBLE SURGERY     NECK SURGERY     PATCH ANGIOPLASTY Left 05/24/2019   Procedure: Patch Angioplasty Superficial Femoral Artery;  Surgeon: Gretta Lonni PARAS, MD;  Location: [facility] OR;  Service: Vascular;  Laterality: Left;    Social History   Socioeconomic History   Marital status: Divorced    Spouse name: Not on file   Number of children: Not on file   Years of education: Not on file   Highest education level: Not on file  Occupational History   Not on file  Tobacco Use   Smoking status: Former    Current  packs/day: 0.00    Average packs/day: 2.0 packs/day for 40.0 years (80.0 ttl pk-yrs)    Types: Cigarettes    Start date: [birth year]/05/1946    Quit date: 11/05/1985    Years since quitting: 38.4   Smokeless tobacco: Current    Types: Chew  Vaping Use   Vaping status: Never Used  Substance and Sexual Activity   Alcohol use: No   Drug use: No   Sexual activity: Not on file  Other Topics Concern   Not on file  Social History Narrative   Not on file   Social Drivers of Health   Financial Resource Strain: Not on file  Food Insecurity: Low Risk  (03/25/2024)   Received from [facility]   Hunger Vital Sign    Within the past 12 months, you worried that your food would run out before you got money to buy more: Never true    Within the past 12 months, the food you bought just didn't last and you didn't have money to get more. : Never true  Transportation Needs: No Transportation Needs (03/25/2024)   Received from [facility]    In the past 12 months, has  lack of reliable transportation kept you from medical appointments, meetings, work or from getting things needed for daily living? : No  Physical Activity: Not on file  Stress: Not on file  Social Connections: Not on file    History reviewed. No pertinent family history.  Outpatient Encounter Medications as of 04/14/2024  Medication Sig   aspirin  81 MG EC tablet Take 81 mg by mouth daily.    carvedilol  (COREG ) 6.25 MG tablet Take 6.25 mg by mouth 2 (two) times daily.   Continuous Blood Gluc Receiver (FREESTYLE LIBRE 2 READER) DEVI As directed   Continuous Blood Gluc Sensor (FREESTYLE LIBRE 2 SENSOR) MISC 1 Piece by Does not apply route every 14 (fourteen) days.   furosemide (LASIX) 20 MG tablet Take 20 mg by mouth 2 (two) times daily.   HUMALOG  KWIKPEN 100 UNIT/ML KwikPen ADMINISTER 10 TO 16 UNITS UNDER THE SKIN THREE TIMES DAILY WITH MEALS   Insulin  Pen Needle (COMFORT EZ PEN NEEDLES) 32G X 8 MM MISC Use as directed to  inject insulin  into the skin   LANTUS  SOLOSTAR 100 UNIT/ML Solostar Pen ADMINISTER 80 UNITS UNDER THE SKIN AT BEDTIME   losartan  (COZAAR ) 25 MG tablet Take 1 tablet (25 mg total) by mouth daily.   rosuvastatin (CRESTOR) 20 MG tablet Take 20 mg by mouth at bedtime.   Vitamin D , Ergocalciferol , (DRISDOL ) 1.25 MG (50000 UNIT) CAPS capsule TAKE 1 CAPSULE BY MOUTH EVERY 7 DAYS   [DISCONTINUED] empagliflozin  (JARDIANCE ) 10 MG TABS tablet Take 1 tablet (10 mg total) by mouth daily before breakfast. (Patient not taking: Reported on 04/14/2024)   No facility-administered encounter medications on file as of 04/14/2024.    ALLERGIES: Allergies  Allergen Reactions   Lisinopril Cough    VACCINATION STATUS: Immunization History  Administered Date(s) Administered   Influenza, High Dose Seasonal PF 06/13/2014, 07/24/2016, 07/15/2017, 07/29/2018   Moderna Sars-Covid-2 Vaccination 12/08/2019, 01/05/2020   Pneumococcal Conjugate-13 06/13/2014   Pneumococcal Polysaccharide-23 04/23/2012    Diabetes He presents for his follow-up diabetic visit. He has type 2 diabetes mellitus. Onset time: He was diagnosed at approximate age of 44 years. His disease course has been improving. Pertinent negatives for hypoglycemia include no confusion, dizziness, headaches, hunger, pallor, seizures, sweats or tremors. Associated symptoms include blurred vision. Pertinent negatives for diabetes include no chest pain, no fatigue, no polydipsia, no polyphagia, no polyuria and no weakness. Pertinent negatives for hypoglycemia complications include no blackouts and no required assistance. Symptoms are improving. Diabetic complications include heart disease and nephropathy. Risk factors for coronary artery disease include diabetes mellitus, dyslipidemia, male sex, hypertension, sedentary lifestyle and tobacco exposure. Current diabetic treatment includes insulin  injections and oral agent (monotherapy). He is compliant with treatment none  of the time. His weight is increasing steadily. He is following a generally unhealthy diet. When asked about meal planning, he reported none. He has not had a previous visit with a dietitian. He never participates in exercise. His home blood glucose trend is decreasing steadily. His overall blood glucose range is 180-200 mg/dl. (He presents without any meter nor his CGM device.  His point-of-care A1c is 8.8% improving from 3.6% during his last visit.  He denies hypoglycemia.  He never filled the prescription given for Jardiance .  He is stable on Lantus  and Humalog  only.  ) An ACE inhibitor/angiotensin II receptor blocker is being taken. Eye exam is current.  Hypertension This is a chronic problem. The current episode started more than 1 year ago. The problem is controlled. Associated  symptoms include blurred vision. Pertinent negatives include no chest pain, headaches, neck pain, palpitations, shortness of breath or sweats. Risk factors for coronary artery disease include diabetes mellitus, dyslipidemia, male gender, obesity, smoking/tobacco exposure, sedentary lifestyle and family history. Past treatments include angiotensin blockers. Hypertensive end-organ damage includes CAD/MI.  Hyperlipidemia This is a chronic problem. The current episode started more than 1 year ago. Exacerbating diseases include diabetes and obesity. Factors aggravating his hyperlipidemia include smoking. Pertinent negatives include no chest pain, myalgias or shortness of breath. Current antihyperlipidemic treatment includes statins. Risk factors for coronary artery disease include dyslipidemia, diabetes mellitus, hypertension, male sex, obesity and a sedentary lifestyle.     Objective:    BP 132/62   Pulse (!) 56   Wt Readings from Last 3 Encounters:  05/28/23 256 lb (116.1 kg)  02/20/23 247 lb (112 kg)  10/22/22 247 lb (112 kg)    Wheelchair-bound due to disequilibrium.  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  CMP     Component Value Date/Time   NA 141 01/14/2024 1017   NA 137 11/06/2015 0950   K 4.3 01/14/2024 1017   K 4.6 11/06/2015 0950   CL 99 01/14/2024 1017   CL 101 10/27/2012 1045   CO2 23 01/14/2024 1017   CO2 28 11/06/2015 0950   GLUCOSE 121 (H) 01/14/2024 1017   GLUCOSE 250 (H) 04/07/2022 1428   GLUCOSE 309 (H) 11/06/2015 0950   GLUCOSE 174 (H) 10/27/2012 1045   BUN 30 (H) 01/14/2024 1017   BUN 12.2 11/06/2015 0950   CREATININE 1.77 (H) 01/14/2024 1017   CREATININE 1.2 11/06/2015 0950   CALCIUM 9.5 01/14/2024 1017   CALCIUM 9.9 11/06/2015 0950   PROT 6.4 01/14/2024 1017   PROT 6.8 11/06/2015 0950   ALBUMIN 4.2 01/14/2024 1017   ALBUMIN 4.1 11/06/2015 0950   AST 12 01/14/2024 1017   AST 12 11/06/2015 0950   ALT 8 01/14/2024 1017   ALT 12 11/06/2015 0950   ALKPHOS 106 01/14/2024 1017   ALKPHOS 94 11/06/2015 0950   BILITOT 0.5 01/14/2024 1017   BILITOT 0.63 11/06/2015 0950   GFRNONAA 33 (L) 04/07/2022 1428   GFRAA 54 (L) 05/25/2019 0254     Diabetic Labs (most recent): Lab Results  Component Value Date   HGBA1C 8.8 (A) 04/14/2024   HGBA1C 10.6 (A) 11/10/2023   HGBA1C 8.6 (A) 05/28/2023   MICROALBUR 22 12/27/2020   MICROALBUR 36 08/14/2020    Lipid Panel     Component Value Date/Time   CHOL 109 01/14/2024 1017   TRIG 79 01/14/2024 1017   HDL 30 (L) 01/14/2024 1017   CHOLHDL 3.6 01/14/2024 1017   LDLCALC 63 01/14/2024 1017     Assessment & Plan:   1. Type 2 diabetes mellitus with stage 3 chronic kidney disease, with long-term current use of insulin  (HCC)  - Cody Wood has currently uncontrolled symptomatic type 2 DM since 78 years of age.  He presents without any meter nor his CGM device.  His point-of-care A1c is 8.8% improving from 3.6% during his last visit.  He denies hypoglycemia.  He never filled the prescription given for Jardiance .  He is stable on Lantus  and Humalog  only.     Recent labs reviewed. - I had a long discussion with him about the progressive nature of diabetes and the pathology behind its complications. -his diabetes is complicated by renal artery disease, CKD, random intermittent severe hypoglycemia and he remains  at exceedingly high high risk for more acute and chronic complications which include CAD, CVA, CKD, retinopathy, and neuropathy. These are all discussed in detail with him.  - I have counseled him on diet management  by adopting a carbohydrate restricted/protein rich diet. - he acknowledges that there is a room for improvement in his food and drink choices. - Suggestion is made for him to avoid simple carbohydrates  from his diet including Cakes, Sweet Desserts, Ice Cream, Soda (diet and regular), Sweet Tea, Candies, Chips, Cookies, Store Bought Juices, Alcohol , Artificial Sweeteners,  Coffee Creamer, and Sugar-free Products, Lemonade. This will help patient to have more stable blood glucose profile and potentially avoid unintended weight gain.   - I encouraged him to switch to  unprocessed or minimally processed complex starch and increased protein intake (animal or plant source), fruits, and vegetables.  - he is advised to stick to a routine mealtimes to eat 3 meals  a day and avoid unnecessary snacks ( to snack only to correct hypoglycemia).   - he will be scheduled with Penny Crumpton, RDN, CDE for individualized diabetes education.  - I have approached him with the following individualized plan to manage diabetes and patient agrees:   -In light of his ongoing hyperglycemic burden, he will continue to need intensive treatment with basal/bolus insulin  in order for him to achieve control of diabetes to target.  He is being assisted by his daughter.    - He never filled the prescription for Jardiance  given to him on 2 previous visits.   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  No sign of active infection. -He is advised to resume and continue Lantus  80 units nightly, Humalog  10-16  units 3 times daily AC for Premeal blood glucose readings above 90 mg/day.   -He is encouraged to continue to use his CGM continuously.   - He  is advised to call clinic for hypoglycemia below 70 or persistent hyperglycemia above 200 mg per DL.  - he is warned not to take insulin  without proper monitoring per orders.   - He will be considered for low-dose GLP-1 receptor agonist on subsequent visits.   - Due to his coarse features including large nose, large feet and hands, I offered him screening for close hormone excess with IGF-I.   Patient specific target  A1c;  LDL, HDL, Triglycerides, and  Waist Circumference were discussed in detail.  2) Blood Pressure /Hypertension:  - He is blood pressure is controlled to target.  It seems like he is on polypharmacy for blood pressure.  His losartan  is lowered to 25 mg p.o. daily at breakfast along with his carvedilol  6.25 mg p.o. twice daily.  He also uses Lasix as needed.    3) Lipids/Hyperlipidemia:   Review of his recent lipid panel showed significantly improved profile with LDL at 69.  He is advised to continue lovastatin 20 mg p.o. nightly.    4)  Weight/Diet:  There is no height or weight on file to calculate BMI.  His last documented weight was 256 pounds-   clearly complicating his diabetes care.  I discussed with him the fact that loss of 5 - 10% of his  current body weight will have the most impact on his diabetes management.  CDE Consult will be initiated . Exercise, and detailed carbohydrates information provided  -  detailed on discharge instructions.  5)  Chronic Care/Health Maintenance:  -he  is on ACEI/ARB and Statin medications and  is encouraged to initiate and continue to follow up with Ophthalmology, Dentist,  Podiatrist at least yearly or according to recommendations,  and advised to  stay away from smoking. I have recommended yearly flu vaccine and pneumonia vaccine at least every 5 years; moderate intensity exercise for up to 150 minutes weekly; and  sleep for at least 7 hours a day.  - he is  advised to maintain close follow up with Trudy Elodia PARAS, PA-C for primary care needs, as well as his other providers for optimal and coordinated care.   I spent  41 minutes in the care of the patient today including review of labs from CMP, Lipids, Thyroid Function, Hematology (current and previous including abstractions from other facilities); face-to-face time discussing  his blood glucose readings/logs, discussing hypoglycemia and hyperglycemia episodes and symptoms, medications doses, his options of short and long term treatment based on the latest standards of care / guidelines;  discussion about incorporating lifestyle medicine;  and documenting the encounter. Risk reduction counseling performed per USPSTF guidelines to reduce  obesity and cardiovascular risk factors.     Please refer to Patient Instructions for Blood Glucose Monitoring and Insulin /Medications Dosing Guide  in media tab for additional information. Please  also refer to  Patient Self Inventory in the Media  tab for reviewed elements of pertinent patient history.  Cody Wood Short and his daughter participated in the discussions, expressed understanding, and voiced agreement with the above plans.  All questions were answered to his satisfaction. he is encouraged to contact clinic should he have any questions or concerns prior to his return visit.     Follow up plan: - Return in about 3 months (around 07/15/2024) for F/U with Pre-visit Labs, Meter/CGM/Logs, A1c here.  Cody Earl, MD [facility] [address] [place], [place] [address] Phone: [phone]  Fax: [phone]    04/14/2024, 4:48 PM  This note was partially dictated with  voice recognition software. Similar sounding words can be transcribed inadequately or may not  be corrected upon review.

## 2024-04-14 NOTE — Patient Instructions (Signed)

## 2024-04-23 ENCOUNTER — Other Ambulatory Visit: Payer: Self-pay | Admitting: "Endocrinology

## 2024-04-23 DIAGNOSIS — E1122 Type 2 diabetes mellitus with diabetic chronic kidney disease: Secondary | ICD-10-CM

## 2024-04-27 ENCOUNTER — Ambulatory Visit: Admitting: Podiatry

## 2024-04-27 ENCOUNTER — Encounter: Payer: Self-pay | Admitting: Podiatry

## 2024-04-27 DIAGNOSIS — M79675 Pain in left toe(s): Secondary | ICD-10-CM

## 2024-04-27 DIAGNOSIS — E1151 Type 2 diabetes mellitus with diabetic peripheral angiopathy without gangrene: Secondary | ICD-10-CM

## 2024-04-27 DIAGNOSIS — M79674 Pain in right toe(s): Secondary | ICD-10-CM

## 2024-04-27 DIAGNOSIS — B351 Tinea unguium: Secondary | ICD-10-CM

## 2024-04-27 DIAGNOSIS — R609 Edema, unspecified: Secondary | ICD-10-CM

## 2024-04-27 NOTE — Progress Notes (Signed)
 This patient returns to my office for at risk foot care.  This patient requires this care by a professional since this patient will be at risk due to having PAD,  CKD and DM.  This patient is unable to cut nails himself since the patient cannot reach his nails.These nails are painful walking and wearing shoes.  This patient presents for at risk foot care today.  General Appearance  Alert, conversant and in no acute stress.  Vascular  Dorsalis pedis and posterior tibial  pulses are  not palpable due to swelling.  bilaterally.  Capillary return is within normal limits  bilaterally. Temperature is within normal limits  bilaterally.  Venous stasis  B/L.  Neurologic  Senn-Weinstein monofilament wire test within normal limits  bilaterally. Muscle power within normal limits bilaterally.  Nails Thick disfigured discolored nails with subungual debris  from hallux to fifth toes bilaterally. No evidence of bacterial infection or drainage bilaterally.  Orthopedic  No limitations of motion  feet .  No crepitus or effusions noted.  No bony pathology or digital deformities noted.  Skin  normotropic skin with no porokeratosis noted bilaterally.  No signs of infections or ulcers noted.     Onychomycosis  Pain in right toes  Pain in left toes  Consent was obtained for treatment procedures.   Mechanical debridement of nails 1-5  bilaterally performed with a nail nipper.  Filed with dremel without incident.    Return office visit  3 months                    Told patient to return for periodic foot care and evaluation due to potential at risk complications.   Cordella Bold DPM

## 2024-06-03 ENCOUNTER — Other Ambulatory Visit: Payer: Self-pay | Admitting: "Endocrinology

## 2024-06-03 DIAGNOSIS — Z9189 Other specified personal risk factors, not elsewhere classified: Secondary | ICD-10-CM

## 2024-07-15 ENCOUNTER — Ambulatory Visit: Admitting: "Endocrinology

## 2024-07-20 LAB — COMPREHENSIVE METABOLIC PANEL WITH GFR
ALT: 9 IU/L (ref 0–44)
AST: 11 IU/L (ref 0–40)
Albumin: 4.5 g/dL (ref 3.8–4.8)
Alkaline Phosphatase: 110 IU/L (ref 47–123)
BUN/Creatinine Ratio: 12 (ref 10–24)
BUN: 21 mg/dL (ref 8–27)
Bilirubin Total: 0.5 mg/dL (ref 0.0–1.2)
CO2: 24 mmol/L (ref 20–29)
Calcium: 9.7 mg/dL (ref 8.6–10.2)
Chloride: 98 mmol/L (ref 96–106)
Creatinine, Ser: 1.72 mg/dL — ABNORMAL HIGH (ref 0.76–1.27)
Globulin, Total: 2 g/dL (ref 1.5–4.5)
Glucose: 166 mg/dL — ABNORMAL HIGH (ref 70–99)
Potassium: 5.1 mmol/L (ref 3.5–5.2)
Sodium: 139 mmol/L (ref 134–144)
Total Protein: 6.5 g/dL (ref 6.0–8.5)
eGFR: 40 mL/min/1.73 — ABNORMAL LOW (ref >59)

## 2024-07-20 LAB — LIPID PANEL
Chol/HDL Ratio: 3.4 ratio (ref 0.0–5.0)
Cholesterol, Total: 114 mg/dL (ref 100–199)
HDL: 34 mg/dL — ABNORMAL LOW (ref >39)
LDL Chol Calc (NIH): 64 mg/dL (ref 0–99)
Triglycerides: 78 mg/dL (ref 0–149)
VLDL Cholesterol Cal: 16 mg/dL (ref 5–40)

## 2024-07-20 LAB — ENHANCED LIVER FIBROSIS (ELF): ELF(TM) Score: 11.04 — AB (ref <9.80)

## 2024-07-20 LAB — FIB-4 W/REFLEX TO ELF
FIB-4 Index: 1.78 (ref 0.00–2.67)
Platelets: 163 x10E3/uL (ref 150–450)

## 2024-07-20 LAB — T4, FREE: Free T4: 1.34 ng/dL (ref 0.82–1.77)

## 2024-07-20 LAB — INSULIN-LIKE GROWTH FACTOR: Insulin-Like GF-1: 376 ng/mL — ABNORMAL HIGH (ref 45–207)

## 2024-07-20 LAB — TSH: TSH: 1.72 u[IU]/mL (ref 0.450–4.500)

## 2024-07-28 ENCOUNTER — Ambulatory Visit: Admitting: Podiatry

## 2024-08-11 ENCOUNTER — Ambulatory Visit: Admitting: "Endocrinology

## 2024-08-11 ENCOUNTER — Encounter: Payer: Self-pay | Admitting: "Endocrinology

## 2024-08-11 ENCOUNTER — Other Ambulatory Visit: Payer: Self-pay | Admitting: "Endocrinology

## 2024-08-11 VITALS — BP 108/54 | HR 64

## 2024-08-11 DIAGNOSIS — Z794 Long term (current) use of insulin: Secondary | ICD-10-CM

## 2024-08-11 DIAGNOSIS — I10 Essential (primary) hypertension: Secondary | ICD-10-CM | POA: Diagnosis not present

## 2024-08-11 DIAGNOSIS — E1122 Type 2 diabetes mellitus with diabetic chronic kidney disease: Secondary | ICD-10-CM

## 2024-08-11 DIAGNOSIS — E782 Mixed hyperlipidemia: Secondary | ICD-10-CM | POA: Diagnosis not present

## 2024-08-11 DIAGNOSIS — K760 Fatty (change of) liver, not elsewhere classified: Secondary | ICD-10-CM | POA: Insufficient documentation

## 2024-08-11 DIAGNOSIS — N183 Chronic kidney disease, stage 3 unspecified: Secondary | ICD-10-CM

## 2024-08-11 DIAGNOSIS — R7989 Other specified abnormal findings of blood chemistry: Secondary | ICD-10-CM | POA: Insufficient documentation

## 2024-08-11 LAB — POCT GLYCOSYLATED HEMOGLOBIN (HGB A1C): HbA1c, POC (controlled diabetic range): 9.4 % — AB (ref 0.0–7.0)

## 2024-08-11 NOTE — Progress Notes (Signed)
 08/11/2024, 3:33 PM  Endocrinology follow-up note   Subjective:    Patient ID: Cody Wood, male    DOB: 1946-01-02.  Cody Wood is being seen in follow-up after he was seen consultation for management of currently uncontrolled symptomatic diabetes in May 2020 requested by  Trudy Elodia PARAS, PA-C.  Past Medical History:  Diagnosis Date   Blockage of coronary artery of heart (HCC)    Nonobstructive CAD by cath 2012   Chronic bronchitis (HCC)    Diabetes mellitus    metformin   hodgkins lymphoma dx'd 06/2008   Hypertension    Peripheral vascular disease     Past Surgical History:  Procedure Laterality Date   ABDOMINAL AORTOGRAM W/LOWER EXTREMITY N/A 05/12/2019   Procedure: ABDOMINAL AORTOGRAM W/LOWER EXTREMITY;  Surgeon: Gretta Lonni PARAS, MD;  Location: [facility] INVASIVE CV LAB;  Service: Cardiovascular;  Laterality: N/A;   CERVICAL SPINE SURGERY  2003   ENDARTERECTOMY FEMORAL Left 05/24/2019   Procedure: ENDARTERECTOMY FEMORAL;  Surgeon: Gretta Lonni PARAS, MD;  Location: [facility] OR;  Service: Vascular;  Laterality: Left;   KNEE SURGERY     LEFT HEART CATHETERIZATION WITH CORONARY ANGIOGRAM N/A 09/03/2011   Procedure: LEFT HEART CATHETERIZATION WITH CORONARY ANGIOGRAM;  Surgeon: Erick JONELLE Bergamo, MD;  Location: [facility] CATH LAB;  Service: Cardiovascular;  Laterality: N/A;   MANDIBLE SURGERY     NECK SURGERY     PATCH ANGIOPLASTY Left 05/24/2019   Procedure: Patch Angioplasty Superficial Femoral Artery;  Surgeon: Gretta Lonni PARAS, MD;  Location: [facility] OR;  Service: Vascular;  Laterality: Left;    Social History   Socioeconomic History   Marital status: Divorced    Spouse name: Not on file   Number of children: Not on file   Years of education: Not on file   Highest education level: Not on file  Occupational History   Not on file  Tobacco Use   Smoking status: Former    Current packs/day:  0.00    Average packs/day: 2.0 packs/day for 40.0 years (80.0 ttl pk-yrs)    Types: Cigarettes    Start date: 11/05/1946    Quit date: 11/05/1985    Years since quitting: 38.7   Smokeless tobacco: Current    Types: Chew  Vaping Use   Vaping status: Never Used  Substance and Sexual Activity   Alcohol use: No   Drug use: No   Sexual activity: Not on file  Other Topics Concern   Not on file  Social History Narrative   Not on file   Social Drivers of Health   Financial Resource Strain: Not on file  Food Insecurity: Low Risk  (03/25/2024)   Received from [facility]   Hunger Vital Sign    Within the past 12 months, you worried that your food would run out before you got money to buy more: Never true    Within the past 12 months, the food you bought just didn't last and you didn't have money to get more. : Never true  Transportation Needs: No Transportation Needs (03/25/2024)   Received from [facility]    In the past 12 months, has lack  of reliable transportation kept you from medical appointments, meetings, work or from getting things needed for daily living? : No  Physical Activity: Not on file  Stress: Not on file  Social Connections: Not on file    History reviewed. No pertinent family history.  Outpatient Encounter Medications as of 08/11/2024  Medication Sig   aspirin  81 MG EC tablet Take 81 mg by mouth daily.    carvedilol  (COREG ) 6.25 MG tablet Take 6.25 mg by mouth 2 (two) times daily.   Continuous Blood Gluc Receiver (FREESTYLE LIBRE 2 READER) DEVI As directed   Continuous Blood Gluc Sensor (FREESTYLE LIBRE 2 SENSOR) MISC 1 Piece by Does not apply route every 14 (fourteen) days.   furosemide (LASIX) 20 MG tablet Take 20 mg by mouth 2 (two) times daily.   HUMALOG  KWIKPEN 100 UNIT/ML KwikPen ADMINISTER 10 TO 16 UNITS UNDER THE SKIN THREE TIMES DAILY WITH MEALS   Insulin  Pen Needle (COMFORT EZ PEN NEEDLES) 32G X 8 MM MISC Use as directed to inject  insulin  into the skin   LANTUS  SOLOSTAR 100 UNIT/ML Solostar Pen ADMINISTER 80 UNITS UNDER THE SKIN AT BEDTIME   losartan  (COZAAR ) 25 MG tablet Take 1 tablet (25 mg total) by mouth daily.   rosuvastatin (CRESTOR) 20 MG tablet Take 20 mg by mouth at bedtime.   Vitamin D , Ergocalciferol , (DRISDOL ) 1.25 MG (50000 UNIT) CAPS capsule TAKE 1 CAPSULE BY MOUTH EVERY 7 DAYS   No facility-administered encounter medications on file as of 08/11/2024.    ALLERGIES: Allergies  Allergen Reactions   Lisinopril Cough    VACCINATION STATUS: Immunization History  Administered Date(s) Administered   INFLUENZA, HIGH DOSE SEASONAL PF 06/13/2014, 07/24/2016, 07/15/2017, 07/29/2018   Moderna Sars-Covid-2 Vaccination 12/08/2019, 01/05/2020   Pneumococcal Conjugate-13 06/13/2014   Pneumococcal Polysaccharide-23 04/23/2012    Diabetes He presents for his follow-up diabetic visit. He has type 2 diabetes mellitus. Onset time: He was diagnosed at approximate age of 44 years. His disease course has been worsening. Pertinent negatives for hypoglycemia include no confusion, dizziness, headaches, hunger, pallor, seizures, sweats or tremors. Associated symptoms include blurred vision. Pertinent negatives for diabetes include no chest pain, no fatigue, no polydipsia, no polyphagia, no polyuria and no weakness. Pertinent negatives for hypoglycemia complications include no blackouts and no required assistance. Symptoms are worsening. Diabetic complications include heart disease and nephropathy. Risk factors for coronary artery disease include diabetes mellitus, dyslipidemia, male sex, hypertension, sedentary lifestyle and tobacco exposure. Current diabetic treatment includes insulin  injections and oral agent (monotherapy). He is compliant with treatment none of the time. His weight is increasing steadily. He is following a generally unhealthy diet. When asked about meal planning, he reported none. He has not had a previous visit  with a dietitian. He never participates in exercise. His home blood glucose trend is increasing steadily. His breakfast blood glucose range is generally 140-180 mg/dl. His lunch blood glucose range is generally 140-180 mg/dl. His dinner blood glucose range is generally 140-180 mg/dl. His bedtime blood glucose range is generally 140-180 mg/dl. His overall blood glucose range is 140-180 mg/dl. (He presents with his CGM device.  AGP report shows 51% time range, 43% Libre 1 hyperglycemia, 6% level 2 hyperglycemia.  He did not have hypoglycemia.  He is average blood glucose was 179 mg per DL for the most recent 14 days with 28% glucose variability.  His point-of-care A1c was 9.4% increasing from 8.8% during his last visit.    ) An ACE inhibitor/angiotensin II receptor blocker  is being taken. Eye exam is current.  Hypertension This is a chronic problem. The current episode started more than 1 year ago. The problem is controlled. Associated symptoms include blurred vision. Pertinent negatives include no chest pain, headaches, neck pain, palpitations, shortness of breath or sweats. Risk factors for coronary artery disease include diabetes mellitus, dyslipidemia, male gender, obesity, smoking/tobacco exposure, sedentary lifestyle and family history. Past treatments include angiotensin blockers. Hypertensive end-organ damage includes CAD/MI.  Hyperlipidemia This is a chronic problem. The current episode started more than 1 year ago. Exacerbating diseases include diabetes and obesity. Factors aggravating his hyperlipidemia include smoking. Pertinent negatives include no chest pain, myalgias or shortness of breath. Current antihyperlipidemic treatment includes statins. Risk factors for coronary artery disease include dyslipidemia, diabetes mellitus, hypertension, male sex, obesity and a sedentary lifestyle.     Objective:    BP (!) 108/54   Pulse 64   Wt Readings from Last 3 Encounters:  05/28/23 256 lb (116.1  kg)  02/20/23 247 lb (112 kg)  10/22/22 247 lb (112 kg)    Wheelchair-bound due to disequilibrium.  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  CMP     Component Value Date/Time   NA 139 07/15/2024 1502   NA 137 11/06/2015 0950   K 5.1 07/15/2024 1502   K 4.6 11/06/2015 0950   CL 98 07/15/2024 1502   CL 101 10/27/2012 1045   CO2 24 07/15/2024 1502   CO2 28 11/06/2015 0950   GLUCOSE 166 (H) 07/15/2024 1502   GLUCOSE 250 (H) 04/07/2022 1428   GLUCOSE 309 (H) 11/06/2015 0950   GLUCOSE 174 (H) 10/27/2012 1045   BUN 21 07/15/2024 1502   BUN 12.2 11/06/2015 0950   CREATININE 1.72 (H) 07/15/2024 1502   CREATININE 1.2 11/06/2015 0950   CALCIUM 9.7 07/15/2024 1502   CALCIUM 9.9 11/06/2015 0950   PROT 6.5 07/15/2024 1502   PROT 6.8 11/06/2015 0950   ALBUMIN 4.5 07/15/2024 1502   ALBUMIN 4.1 11/06/2015 0950   AST 11 07/15/2024 1502   AST 12 11/06/2015 0950   ALT 9 07/15/2024 1502   ALT 12 11/06/2015 0950   ALKPHOS 110 07/15/2024 1502   ALKPHOS 94 11/06/2015 0950   BILITOT 0.5 07/15/2024 1502   BILITOT 0.63 11/06/2015 0950   GFRNONAA 33 (L) 04/07/2022 1428   GFRAA 54 (L) 05/25/2019 0254     Diabetic Labs (most recent): Lab Results  Component Value Date   HGBA1C 9.4 (A) 08/11/2024   HGBA1C 8.8 (A) 04/14/2024   HGBA1C 10.6 (A) 11/10/2023   MICROALBUR 22 12/27/2020   MICROALBUR 36 08/14/2020    Lipid Panel     Component Value Date/Time   CHOL 114 07/15/2024 1502   TRIG 78 07/15/2024 1502   HDL 34 (L) 07/15/2024 1502   CHOLHDL 3.4 07/15/2024 1502   LDLCALC 64 07/15/2024 1502     Assessment & Plan:   1. Type 2 diabetes mellitus with stage 3 chronic kidney disease, with long-term current use of insulin  (HCC)  - Jeremey G Onstad has currently uncontrolled symptomatic type 2 DM since 78 years of age.  He presents with his CGM device.  AGP report shows 51% time range, 43% Libre 1 hyperglycemia, 6% level 2  hyperglycemia.  He did not have hypoglycemia.  He is average blood glucose was 179 mg per DL for the most recent 14 days with 28% glucose variability.  His point-of-care A1c was 9.4% increasing from 8.8% during his  last visit.  Admittedly, he has been skipping significant number of his basal as well as prandial insulin  dosing after changes in the interval.   Recent labs reviewed. - I had a long discussion with him about the progressive nature of diabetes and the pathology behind its complications. -his diabetes is complicated by renal artery disease, CKD, random intermittent severe hypoglycemia and he remains at exceedingly high high risk for more acute and chronic complications which include CAD, CVA, CKD, retinopathy, and neuropathy. These are all discussed in detail with him.  - I have counseled him on diet management  by adopting a carbohydrate restricted/protein rich diet. - he acknowledges that there is a room for improvement in his food and drink choices. - Suggestion is made for him to avoid simple carbohydrates  from his diet including Cakes, Sweet Desserts, Ice Cream, Soda (diet and regular), Sweet Tea, Candies, Chips, Cookies, Store Bought Juices, Alcohol , Artificial Sweeteners,  Coffee Creamer, and Sugar-free Products, Lemonade. This will help patient to have more stable blood glucose profile and potentially avoid unintended weight gain.   - I encouraged him to switch to  unprocessed or minimally processed complex starch and increased protein intake (animal or plant source), fruits, and vegetables.  - he is advised to stick to a routine mealtimes to eat 3 meals  a day and avoid unnecessary snacks ( to snack only to correct hypoglycemia).   - he will be scheduled with Penny Crumpton, RDN, CDE for individualized diabetes education.  - I have approached him with the following individualized plan to manage diabetes and patient agrees:   -In light of his ongoing hyperglycemic burden, he  will continue to need intensive treatment with basal/bolus insulin  in order for him to achieve control of diabetes to target.  He is being assisted by his daughter.    - He never filled the prescription for Jardiance  given to him on 2 previous visits.   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  No sign of active infection. -He is advised to resume and continue Lantus  80 units nightly, continue Humalog  10 -16  units 3 times daily AC for Premeal blood glucose readings above 90 mg/day.   -He is encouraged to continue to use his CGM continuously.   - He  is advised to call clinic for hypoglycemia below 70 or persistent hyperglycemia above 200 mg per DL.  - he is warned not to take insulin  without proper monitoring per orders.   - He will be considered for low-dose GLP-1 receptor agonist on subsequent visits.   - Due to his coarse features including large nose, large feet and hands, he was offered screening for gross hormone excess with IGF-I which returned high at 376.  Patient will be sent for glucose suppression test as the next step in screening for acromegaly.  If he is found to have elevated growth hormone levels, he will be considered for pituitary MRI.    Patient specific target  A1c;  LDL, HDL, Triglycerides, and  Waist Circumference were discussed in detail.  2) Blood Pressure /Hypertension:  - His blood pressure is controlled to target.  It seems like he is on polypharmacy for blood pressure.  His losartan  is lowered to 25 mg p.o. daily at breakfast along with his carvedilol  6.25 mg p.o. twice daily.  He also uses Lasix as needed.    3) Lipids/Hyperlipidemia:   Review  of his recent lipid panel showed significantly improved profile with LDL at 69.  He is advised to continue lovastatin 20 mg p.o. nightly.  He is fibrosis score is high as well as ELF of 11.04.  Will benefit from GI  evaluation, will arrange for referral.   4)  Weight/Diet:  There is no height or weight on file to calculate BMI.  His last documented weight was 256 pounds-   clearly complicating his diabetes care.  I discussed with him the fact that loss of 5 - 10% of his  current body weight will have the most impact on his diabetes management.  CDE Consult will be initiated . Exercise, and detailed carbohydrates information provided  -  detailed on discharge instructions.  5) Chronic Care/Health Maintenance:  -he  is on ACEI/ARB and Statin medications and  is encouraged to initiate and continue to follow up with Ophthalmology, Dentist,  Podiatrist at least yearly or according to recommendations, and advised to  stay away from smoking. I have recommended yearly flu vaccine and pneumonia vaccine at least every 5 years; moderate intensity exercise for up to 150 minutes weekly; and  sleep for at least 7 hours a day.  - he is  advised to maintain close follow up with Trudy Elodia PARAS, PA-C for primary care needs, as well as his other providers for optimal and coordinated care.   I spent  41  minutes in the care of the patient today including review of labs from CMP, Lipids, Thyroid Function, Hematology (current and previous including abstractions from other facilities); face-to-face time discussing  his blood glucose readings/logs, discussing hypoglycemia and hyperglycemia episodes and symptoms, medications doses, his options of short and long term treatment based on the latest standards of care / guidelines;  discussion about incorporating lifestyle medicine;  and documenting the encounter. Risk reduction counseling performed per USPSTF guidelines to reduce  obesity and cardiovascular risk factors.     Please refer to Patient Instructions for Blood Glucose Monitoring and Insulin /Medications Dosing Guide  in media tab for additional information. Please  also refer to  Patient Self Inventory in the Media  tab  for reviewed elements of pertinent patient history.  Morton MATSU Porche participated in the discussions, expressed understanding, and voiced agreement with the above plans.  All questions were answered to his satisfaction. he is encouraged to contact clinic should he have any questions or concerns prior to his return visit.    Follow up plan: - Return in about 5 weeks (around 09/15/2024) for F/U with Meter/CGM Jonnie Only - no Labs.  Ranny Earl, MD [facility] [address] [place], [place] [address] Phone: [phone]  Fax: [phone]    08/11/2024, 3:33 PM  This note was partially dictated with voice recognition software. Similar sounding words can be transcribed inadequately or may not  be corrected upon review.

## 2024-08-11 NOTE — Patient Instructions (Signed)

## 2024-08-13 ENCOUNTER — Encounter: Payer: Self-pay | Admitting: Internal Medicine

## 2024-08-25 ENCOUNTER — Other Ambulatory Visit: Payer: Self-pay | Admitting: "Endocrinology

## 2024-08-25 DIAGNOSIS — E1122 Type 2 diabetes mellitus with diabetic chronic kidney disease: Secondary | ICD-10-CM

## 2024-09-20 ENCOUNTER — Ambulatory Visit: Admitting: "Endocrinology

## 2024-09-29 ENCOUNTER — Encounter: Payer: Self-pay | Admitting: Internal Medicine

## 2024-09-29 ENCOUNTER — Ambulatory Visit: Admitting: Internal Medicine

## 2024-10-01 ENCOUNTER — Other Ambulatory Visit: Payer: Self-pay | Admitting: "Endocrinology

## 2024-10-01 DIAGNOSIS — Z9189 Other specified personal risk factors, not elsewhere classified: Secondary | ICD-10-CM

## 2024-10-04 ENCOUNTER — Ambulatory Visit: Admitting: "Endocrinology

## 2024-10-18 ENCOUNTER — Other Ambulatory Visit: Payer: Self-pay

## 2024-10-18 ENCOUNTER — Encounter (HOSPITAL_BASED_OUTPATIENT_CLINIC_OR_DEPARTMENT_OTHER): Payer: Self-pay | Admitting: Radiology

## 2024-10-18 ENCOUNTER — Emergency Department (HOSPITAL_BASED_OUTPATIENT_CLINIC_OR_DEPARTMENT_OTHER)
Admission: EM | Admit: 2024-10-18 | Discharge: 2024-10-18 | Discharge status: Against Medical Advice | Attending: Emergency Medicine | Admitting: Emergency Medicine

## 2024-10-18 DIAGNOSIS — K5900 Constipation, unspecified: Secondary | ICD-10-CM | POA: Insufficient documentation

## 2024-10-18 DIAGNOSIS — Z5321 Procedure and treatment not carried out due to patient leaving prior to being seen by health care provider: Secondary | ICD-10-CM | POA: Insufficient documentation

## 2024-10-18 NOTE — ED Notes (Signed)
 Advised by registration that patient left.

## 2024-10-18 NOTE — ED Triage Notes (Signed)
 Pt has not had a BM in 12 days. HE has taken healthy lax and miralax  and has not had a BM. He is not very mobile.
